# Patient Record
Sex: Female | Born: 1935 | Race: White | Hispanic: No | Smoking: Never smoker
Health system: Southern US, Community
[De-identification: ages and names within clinical notes are randomized; demographics above are authoritative.]

## PROBLEM LIST (undated history)

## (undated) DIAGNOSIS — I10 Essential (primary) hypertension: Secondary | ICD-10-CM

## (undated) DIAGNOSIS — D649 Anemia, unspecified: Secondary | ICD-10-CM

## (undated) DIAGNOSIS — K120 Recurrent oral aphthae: Secondary | ICD-10-CM

## (undated) DIAGNOSIS — F5102 Adjustment insomnia: Secondary | ICD-10-CM

## (undated) DIAGNOSIS — M545 Low back pain, unspecified: Secondary | ICD-10-CM

## (undated) DIAGNOSIS — F0280 Dementia in other diseases classified elsewhere without behavioral disturbance: Secondary | ICD-10-CM

## (undated) DIAGNOSIS — L0291 Cutaneous abscess, unspecified: Secondary | ICD-10-CM

## (undated) DIAGNOSIS — I4430 Unspecified atrioventricular block: Secondary | ICD-10-CM

## (undated) DIAGNOSIS — N181 Chronic kidney disease, stage 1: Secondary | ICD-10-CM

## (undated) DIAGNOSIS — N905 Atrophy of vulva: Secondary | ICD-10-CM

## (undated) DIAGNOSIS — M4800 Spinal stenosis, site unspecified: Secondary | ICD-10-CM

## (undated) DIAGNOSIS — J45909 Unspecified asthma, uncomplicated: Secondary | ICD-10-CM

## (undated) DIAGNOSIS — S43006A Unspecified dislocation of unspecified shoulder joint, initial encounter: Secondary | ICD-10-CM

## (undated) DIAGNOSIS — R32 Unspecified urinary incontinence: Secondary | ICD-10-CM

## (undated) DIAGNOSIS — M810 Age-related osteoporosis without current pathological fracture: Secondary | ICD-10-CM

## (undated) DIAGNOSIS — L723 Sebaceous cyst: Secondary | ICD-10-CM

## (undated) DIAGNOSIS — F329 Major depressive disorder, single episode, unspecified: Secondary | ICD-10-CM

## (undated) DIAGNOSIS — I951 Orthostatic hypotension: Secondary | ICD-10-CM

## (undated) DIAGNOSIS — M1990 Unspecified osteoarthritis, unspecified site: Secondary | ICD-10-CM

## (undated) DIAGNOSIS — R000 Tachycardia, unspecified: Secondary | ICD-10-CM

## (undated) DIAGNOSIS — R4182 Altered mental status, unspecified: Secondary | ICD-10-CM

## (undated) DIAGNOSIS — G4700 Insomnia, unspecified: Secondary | ICD-10-CM

## (undated) DIAGNOSIS — R51 Headache: Secondary | ICD-10-CM

## (undated) DIAGNOSIS — N289 Disorder of kidney and ureter, unspecified: Secondary | ICD-10-CM

## (undated) DIAGNOSIS — J449 Chronic obstructive pulmonary disease, unspecified: Secondary | ICD-10-CM

## (undated) DIAGNOSIS — F3289 Other specified depressive episodes: Secondary | ICD-10-CM

## (undated) DIAGNOSIS — R350 Frequency of micturition: Secondary | ICD-10-CM

## (undated) DIAGNOSIS — G2581 Restless legs syndrome: Secondary | ICD-10-CM

## (undated) DIAGNOSIS — E559 Vitamin D deficiency, unspecified: Secondary | ICD-10-CM

## (undated) DIAGNOSIS — R609 Edema, unspecified: Secondary | ICD-10-CM

## (undated) DIAGNOSIS — F411 Generalized anxiety disorder: Secondary | ICD-10-CM

## (undated) DIAGNOSIS — R627 Adult failure to thrive: Secondary | ICD-10-CM

## (undated) DIAGNOSIS — M109 Gout, unspecified: Secondary | ICD-10-CM

## (undated) DIAGNOSIS — L0390 Cellulitis, unspecified: Secondary | ICD-10-CM

## (undated) DIAGNOSIS — R269 Unspecified abnormalities of gait and mobility: Secondary | ICD-10-CM

## (undated) HISTORY — DX: Headache: R51

## (undated) HISTORY — DX: Orthostatic hypotension: I95.1

## (undated) HISTORY — DX: Chronic kidney disease, stage 1: N18.1

## (undated) HISTORY — DX: Unspecified asthma, uncomplicated: J45.909

## (undated) HISTORY — PX: BREAST SURGERY: SHX581

## (undated) HISTORY — DX: Low back pain, unspecified: M54.50

## (undated) HISTORY — DX: Sebaceous cyst: L72.3

## (undated) HISTORY — DX: Unspecified urinary incontinence: R32

## (undated) HISTORY — DX: Vitamin D deficiency, unspecified: E55.9

## (undated) HISTORY — DX: Adjustment insomnia: F51.02

## (undated) HISTORY — DX: Tachycardia, unspecified: R00.0

## (undated) HISTORY — DX: Major depressive disorder, single episode, unspecified: F32.9

## (undated) HISTORY — DX: Cutaneous abscess, unspecified: L02.91

## (undated) HISTORY — PX: HIP SURGERY: SHX245

## (undated) HISTORY — DX: Frequency of micturition: R35.0

## (undated) HISTORY — DX: Edema, unspecified: R60.9

## (undated) HISTORY — DX: Age-related osteoporosis without current pathological fracture: M81.0

## (undated) HISTORY — DX: Insomnia, unspecified: G47.00

## (undated) HISTORY — PX: BASAL CELL CARCINOMA EXCISION: SHX1214

## (undated) HISTORY — PX: EYE SURGERY: SHX253

## (undated) HISTORY — DX: Unspecified abnormalities of gait and mobility: R26.9

## (undated) HISTORY — DX: Unspecified dislocation of unspecified shoulder joint, initial encounter: S43.006A

## (undated) HISTORY — PX: BACK SURGERY: SHX140

## (undated) HISTORY — DX: Dementia in other diseases classified elsewhere, unspecified severity, without behavioral disturbance, psychotic disturbance, mood disturbance, and anxiety: F02.80

## (undated) HISTORY — DX: Disorder of kidney and ureter, unspecified: N28.9

## (undated) HISTORY — DX: Altered mental status, unspecified: R41.82

## (undated) HISTORY — DX: Other specified depressive episodes: F32.89

## (undated) HISTORY — DX: Restless legs syndrome: G25.81

## (undated) HISTORY — DX: Recurrent oral aphthae: K12.0

## (undated) HISTORY — PX: ABDOMINAL HYSTERECTOMY: SHX81

## (undated) HISTORY — DX: Low back pain: M54.5

## (undated) HISTORY — DX: Gout, unspecified: M10.9

## (undated) HISTORY — DX: Adult failure to thrive: R62.7

## (undated) HISTORY — DX: Generalized anxiety disorder: F41.1

## (undated) HISTORY — DX: Unspecified osteoarthritis, unspecified site: M19.90

## (undated) HISTORY — DX: Spinal stenosis, site unspecified: M48.00

## (undated) HISTORY — DX: Unspecified atrioventricular block: I44.30

## (undated) HISTORY — DX: Atrophy of vulva: N90.5

## (undated) HISTORY — DX: Cellulitis, unspecified: L03.90

## (undated) HISTORY — DX: Anemia, unspecified: D64.9

---

## 2003-07-27 ENCOUNTER — Emergency Department (HOSPITAL_COMMUNITY): Admission: AD | Admit: 2003-07-27 | Discharge: 2003-07-27 | Payer: Self-pay | Admitting: Family Medicine

## 2004-07-22 ENCOUNTER — Ambulatory Visit: Payer: Self-pay | Admitting: Dentistry

## 2004-07-22 ENCOUNTER — Encounter: Admission: AD | Admit: 2004-07-22 | Discharge: 2004-07-22 | Payer: Self-pay | Admitting: Dentistry

## 2004-07-25 ENCOUNTER — Inpatient Hospital Stay (HOSPITAL_COMMUNITY): Admission: EM | Admit: 2004-07-25 | Discharge: 2004-07-29 | Payer: Self-pay | Admitting: Emergency Medicine

## 2004-08-05 ENCOUNTER — Ambulatory Visit: Payer: Self-pay | Admitting: Dentistry

## 2004-12-14 ENCOUNTER — Emergency Department (HOSPITAL_COMMUNITY): Admission: EM | Admit: 2004-12-14 | Discharge: 2004-12-14 | Payer: Self-pay | Admitting: Family Medicine

## 2004-12-19 ENCOUNTER — Ambulatory Visit: Payer: Self-pay | Admitting: Dentistry

## 2005-02-12 ENCOUNTER — Inpatient Hospital Stay (HOSPITAL_COMMUNITY): Admission: EM | Admit: 2005-02-12 | Discharge: 2005-02-17 | Payer: Self-pay | Admitting: Emergency Medicine

## 2006-02-16 ENCOUNTER — Encounter: Admission: RE | Admit: 2006-02-16 | Discharge: 2006-02-16 | Payer: Self-pay | Admitting: Neurosurgery

## 2006-03-10 ENCOUNTER — Encounter: Admission: RE | Admit: 2006-03-10 | Discharge: 2006-03-10 | Payer: Self-pay | Admitting: Neurosurgery

## 2006-08-27 ENCOUNTER — Encounter: Admission: RE | Admit: 2006-08-27 | Discharge: 2006-08-27 | Payer: Self-pay | Admitting: Neurosurgery

## 2006-10-29 ENCOUNTER — Inpatient Hospital Stay (HOSPITAL_COMMUNITY): Admission: RE | Admit: 2006-10-29 | Discharge: 2006-11-09 | Payer: Self-pay | Admitting: Neurosurgery

## 2006-10-29 ENCOUNTER — Ambulatory Visit: Payer: Self-pay | Admitting: Pulmonary Disease

## 2006-11-01 ENCOUNTER — Ambulatory Visit: Payer: Self-pay | Admitting: Physical Medicine & Rehabilitation

## 2006-12-14 ENCOUNTER — Ambulatory Visit: Payer: Self-pay | Admitting: Vascular Surgery

## 2006-12-14 ENCOUNTER — Encounter: Payer: Self-pay | Admitting: Vascular Surgery

## 2006-12-14 ENCOUNTER — Ambulatory Visit: Admission: RE | Admit: 2006-12-14 | Discharge: 2006-12-14 | Payer: Self-pay | Admitting: *Deleted

## 2007-02-04 ENCOUNTER — Ambulatory Visit: Payer: Self-pay | Admitting: Vascular Surgery

## 2007-02-25 ENCOUNTER — Ambulatory Visit: Payer: Self-pay | Admitting: Vascular Surgery

## 2007-09-23 ENCOUNTER — Emergency Department (HOSPITAL_COMMUNITY): Admission: EM | Admit: 2007-09-23 | Discharge: 2007-09-23 | Payer: Self-pay | Admitting: Emergency Medicine

## 2008-06-21 ENCOUNTER — Encounter: Admission: RE | Admit: 2008-06-21 | Discharge: 2008-06-21 | Payer: Self-pay | Admitting: Neurosurgery

## 2009-04-29 ENCOUNTER — Encounter: Payer: Self-pay | Admitting: Internal Medicine

## 2009-04-30 ENCOUNTER — Inpatient Hospital Stay (HOSPITAL_COMMUNITY): Admission: RE | Admit: 2009-04-30 | Discharge: 2009-05-03 | Payer: Self-pay | Admitting: Orthopaedic Surgery

## 2009-06-05 ENCOUNTER — Encounter: Admission: RE | Admit: 2009-06-05 | Discharge: 2009-06-05 | Payer: Self-pay | Admitting: Internal Medicine

## 2009-07-06 ENCOUNTER — Inpatient Hospital Stay (HOSPITAL_COMMUNITY): Admission: EM | Admit: 2009-07-06 | Discharge: 2009-07-07 | Payer: Self-pay | Admitting: Emergency Medicine

## 2009-08-02 ENCOUNTER — Inpatient Hospital Stay (HOSPITAL_COMMUNITY): Admission: EM | Admit: 2009-08-02 | Discharge: 2009-08-03 | Payer: Self-pay | Admitting: Emergency Medicine

## 2009-08-24 ENCOUNTER — Inpatient Hospital Stay (HOSPITAL_COMMUNITY): Admission: EM | Admit: 2009-08-24 | Discharge: 2009-08-24 | Payer: Self-pay | Admitting: Emergency Medicine

## 2009-12-14 ENCOUNTER — Ambulatory Visit (HOSPITAL_COMMUNITY): Admission: RE | Admit: 2009-12-14 | Discharge: 2009-12-14 | Payer: Self-pay | Admitting: Unknown Physician Specialty

## 2009-12-21 ENCOUNTER — Observation Stay (HOSPITAL_COMMUNITY): Admission: EM | Admit: 2009-12-21 | Discharge: 2009-12-26 | Payer: Self-pay | Admitting: Emergency Medicine

## 2010-02-02 ENCOUNTER — Emergency Department (HOSPITAL_COMMUNITY)
Admission: EM | Admit: 2010-02-02 | Discharge: 2010-02-02 | Payer: Self-pay | Source: Home / Self Care | Admitting: Emergency Medicine

## 2010-02-02 ENCOUNTER — Encounter: Payer: Self-pay | Admitting: Internal Medicine

## 2010-02-06 ENCOUNTER — Ambulatory Visit: Payer: Self-pay | Admitting: Internal Medicine

## 2010-02-06 DIAGNOSIS — I10 Essential (primary) hypertension: Secondary | ICD-10-CM

## 2010-02-06 DIAGNOSIS — R9431 Abnormal electrocardiogram [ECG] [EKG]: Secondary | ICD-10-CM

## 2010-02-06 DIAGNOSIS — J45909 Unspecified asthma, uncomplicated: Secondary | ICD-10-CM | POA: Insufficient documentation

## 2010-08-06 ENCOUNTER — Encounter: Admission: RE | Admit: 2010-08-06 | Discharge: 2010-08-06 | Payer: Self-pay | Admitting: Internal Medicine

## 2010-08-08 IMAGING — RF DG HIP OPERATIVE*R*
1 series · 1 of 1 positions shown · non-contrast
Comparison: Right hip x-rays earlier today.

CLINICAL DATA: Closed reduction right hip prosthesis dislocation.

OPERATIVE RIGHT HIP 1 VIEW [DATE]/[id number] hours:

[Series 1: run · 1 of 1 slices shown]
[im 1/1]
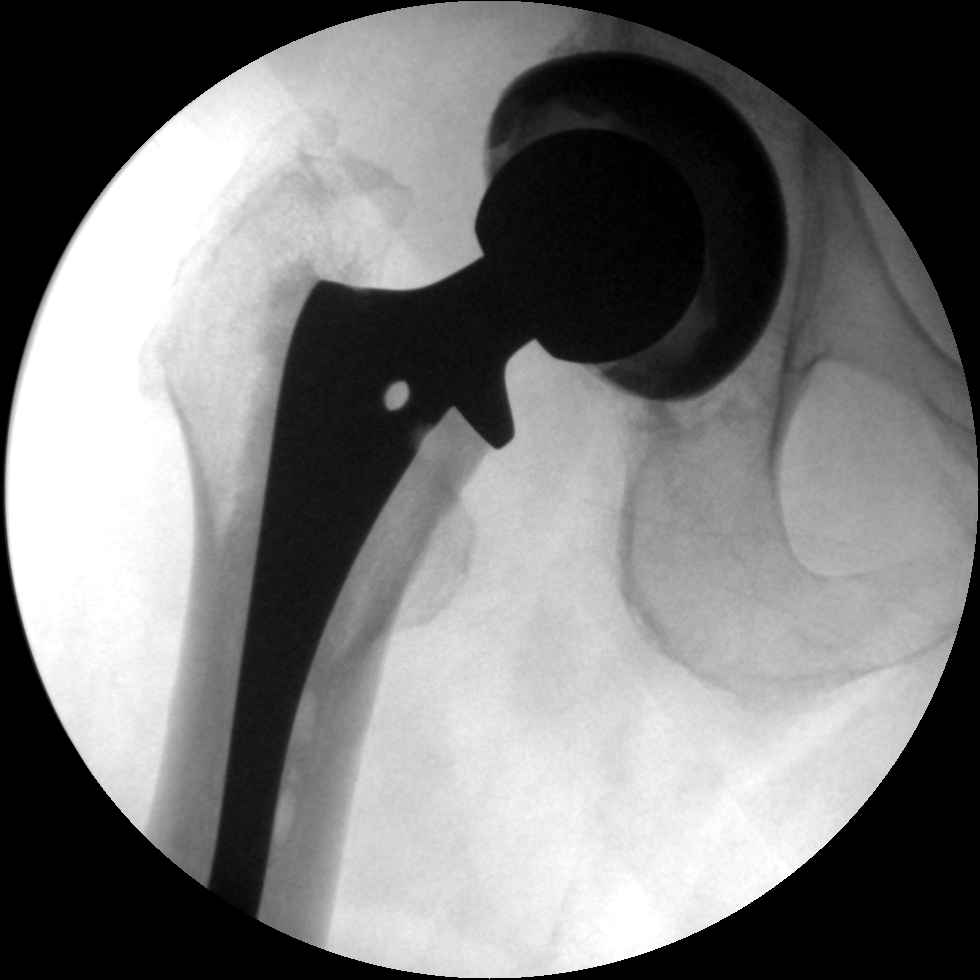

[1 of 1 positions shown; findings below may reference images not displayed]

FINDINGS: Anatomic alignment in the AP projection, status post
reduction of the right hip prosthesis dislocation.
IMPRESSION: Anatomic alignment status post right hip prosthesis reduction.

## 2010-08-08 IMAGING — CR DG HIP COMPLETE 2+V*R*
3 series · 3 of 3 positions shown · non-contrast
Comparison: 07/06/2009

CLINICAL DATA: Hip pain

RIGHT HIP - COMPLETE 2+ VIEW

[t pelvis a.p.]
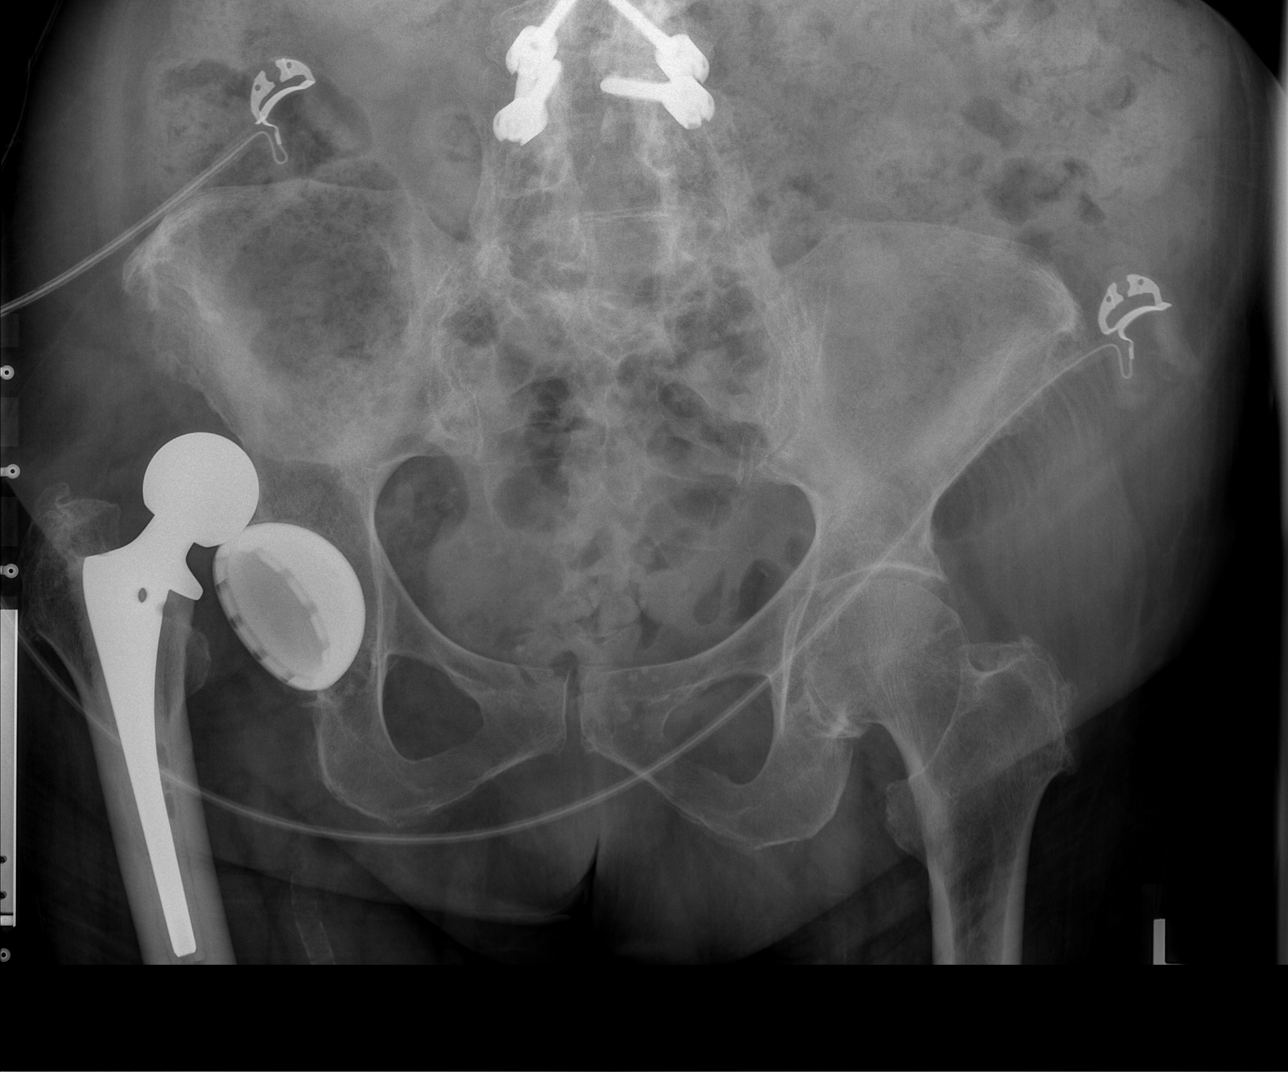

[t hip ap right]
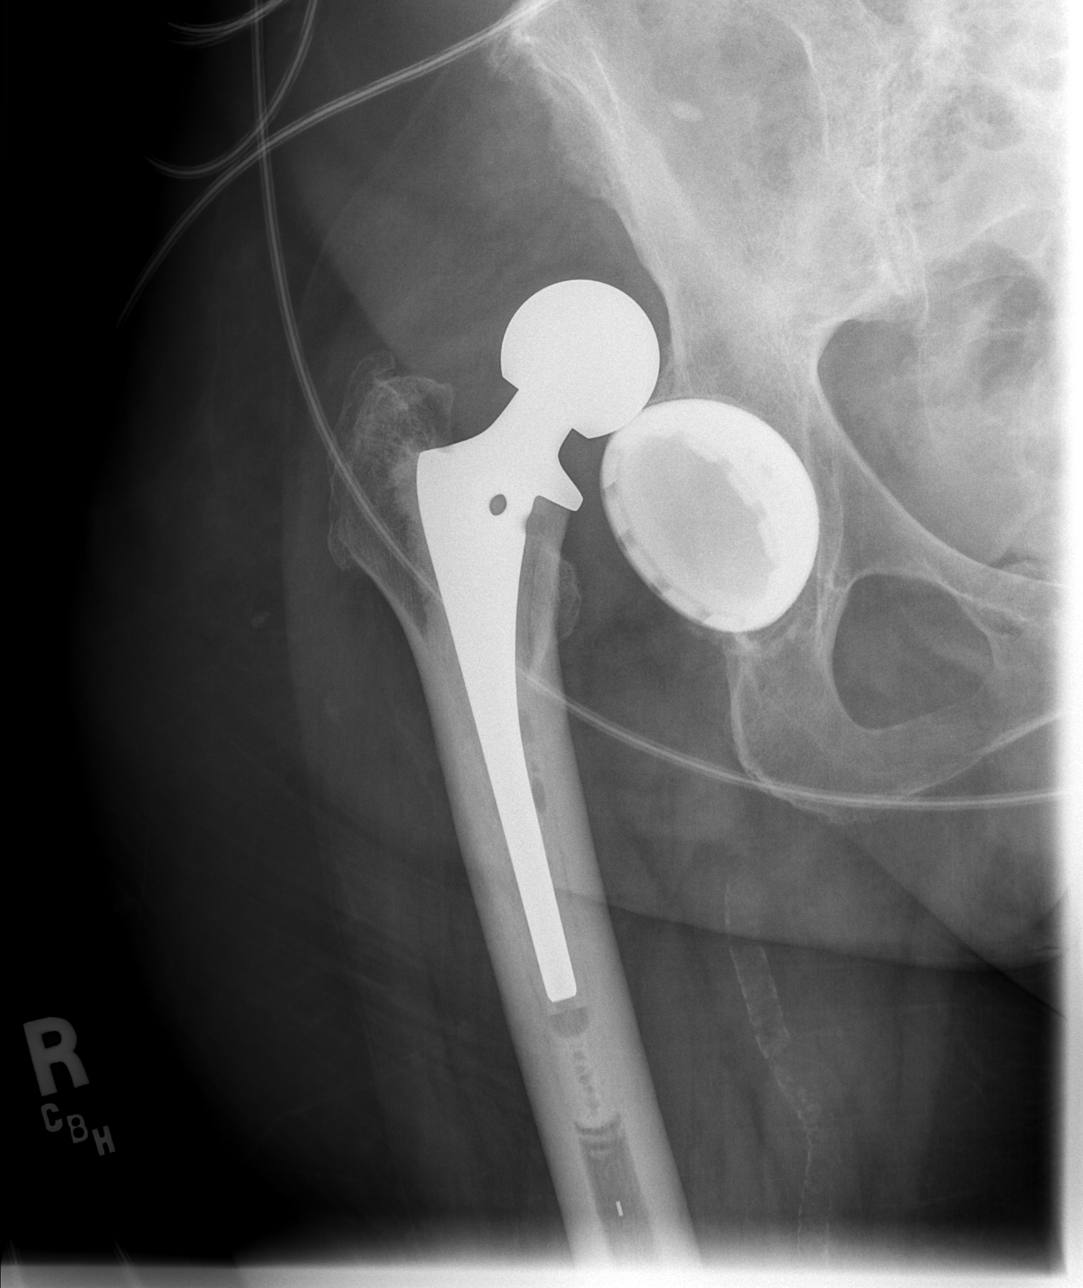

[w cross table hip right]
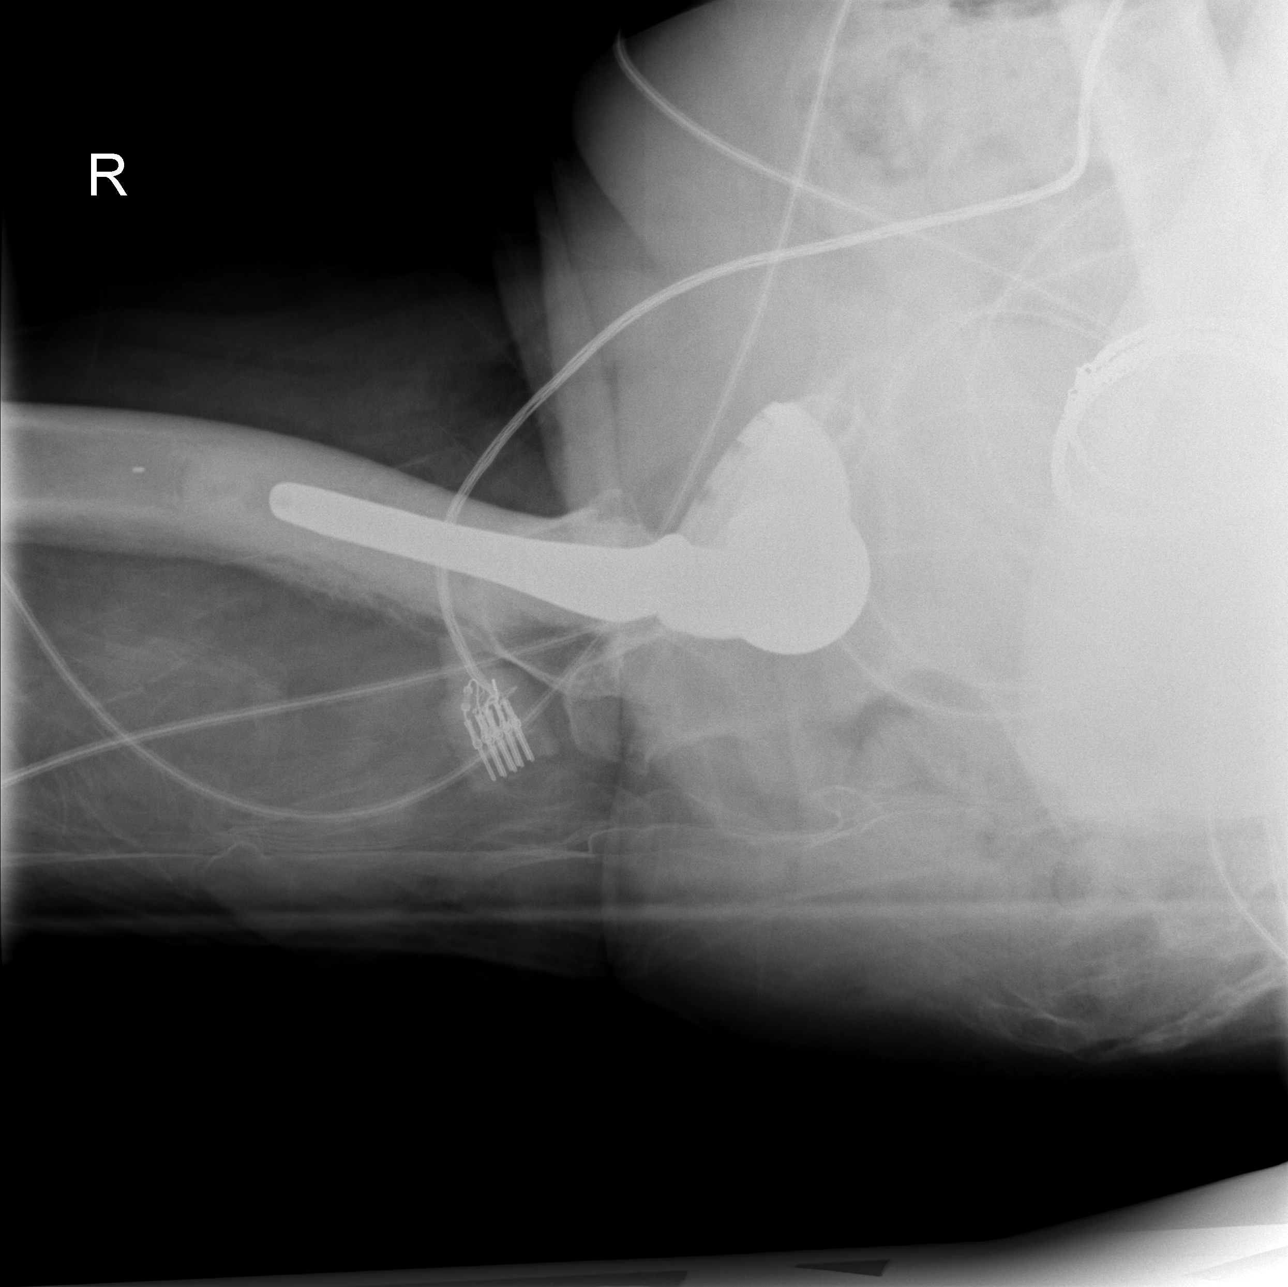

[3 of 3 positions shown; findings below may reference images not displayed]

FINDINGS: The patient is status post right hip arthroplasty.

There has been superior dislocation of the right hip with respect
to the acetabulum.

No fractures are identified.

There are transpedicular screws and rods identified within the
lumbar spine.
IMPRESSION: 1.  Status post superior dislocation of the right hip.

## 2010-10-19 ENCOUNTER — Encounter: Payer: Self-pay | Admitting: Neurosurgery

## 2010-10-28 NOTE — Assessment & Plan Note (Signed)
Summary: eph/jml   History of Present Illness: patient is a 77 year old who is referred for evaluation of conduction delay. The patient has a history of HTN.  She has been on several medinces including verapamil.  In August of last year, EKG showed first degree AV block. ON Sunday she was seen in the [facility] ER because of shortness of breath.  Sat was reported 90% but in the ER it was 95%.   The patient's bp intially was 129/43; Pulse 73.  Work up ssusp for possible URI possible pneumonia.  She was sent back to [place] on Abx.  Of note EDG showed Type 1 second degree AVB which was new. I spoke with the patient''s daughter earlier this week.  She is concerned that patients recent falls may be related to possible bradycardia leading to dizziness.  After review I recommended to S. [place] that she stop Verapamil 240 and come in for evaluation. The patient denies shorthess of breath.  No chest pains.  She is getting stronger with rehab.  No dizziness. Patient sleeps well at night.  No pillow.  Current Medications (verified): 1)  Lisinopril 40 Mg Tabs (Lisinopril) .Marland Kitchen.. 1 Tab Every Day 2)  Lasix 20 Mg Tabs (Furosemide) .Marland Kitchen.. 1 Tab As Needed Edema in Legs/feet 3)  Robaxin 500 Mg Tabs (Methocarbamol) .Marland Kitchen.. 1 Tab 3 Times Per Day Prn 4)  Colcrys 0.6 Mg Tabs (Colchicine) .Marland Kitchen.. 1 Tab Every Day As Needed For Gout 5)  Clonazepam 1 Mg Tabs (Clonazepam) .Marland Kitchen.. 1 Tab Two Times A Day As Needed Anxiety 6)  Aricept 10 Mg Tabs (Donepezil Hcl) .Marland Kitchen.. 1 Tab Every Day 7)  Remeron Soltab 15 Mg Tbdp (Mirtazapine) .Marland Kitchen.. 1 Tab At Bedtime 8)  Singulair 10 Mg Tabs (Montelukast Sodium) .Marland Kitchen.. 1 Tab At Bedtime 9)  Allopurinol 300 Mg Tabs (Allopurinol) .Marland Kitchen.. 1 Tab At Bedtime 10)  Avelox 400 Mg Tabs (Moxifloxacin Hcl) .Marland Kitchen.. 1 Tab Every Day Times 7 Days 11)  Advair Diskus 250-50 Mcg/dose Aepb (Fluticasone-Salmeterol) .Marland Kitchen.. 1 Puff 2 Times Per Day 12)  Klor-Con M10 10 Meq Cr-Tabs (Potassium Chloride Crys Cr) .Marland Kitchen.. 1 Tab ( Take With  Lasix) 13)  Hydrochlorothiazide 25 Mg Tabs (Hydrochlorothiazide) .Marland Kitchen.. 1 Tab Every Day in The Am  Past History:  Past Medical History: Current Problems:  ALZHEIMER'S DISEASE (ICD-331.0) ASTHMA, UNSPECIFIED, UNSPECIFIED STATUS (ICD-493.90) HYPERTENSION (ICD-401.9) Hx multiple joint dislocations Anxiety Arthritis Gout  Past Surgical History: . Status post right hip replacement with recurrent dislocation.  Back surgery Breast reduction carpal tunnel release Cataract surgery TAH Face lift  Family History: Mother CVA Father: CVA Sister with CHF, CVA Brother died in 3s of MI.  Social History: no tobaccoc occasional EtOH.  Review of Systems       All systems reviewed.  Negative to the above problem except as noted above.  Vital Signs:  Patient profile:   77 year old female Pulse rate:   76 / minute BP sitting:   134 / 72  (left arm) BP standing:   160 / 86  (left arm)  Physical Exam  General:  Petite 77 year old in NAD  Head:  normocephalic and atraumatic Neck:  No bruits.  NO thyromegaly.  No significant JVD Lungs:  Relatively clear.  No rales or wheezes. Heart:  RRR.  S1, S2.  No S3.  No murmurs. Abdomen:  Supple.  Obese.  No hepatomegaly  Nontender.  No masses. Extremities:  No lower extrem edema.  2+ pules. Neurologic:  A and  O x. 3.   EKG  Procedure date:  02/06/2010  Findings:      NSR . 66 bpm.  First degree AV block  PR 260 msec.  LVH.  Por R  wave progression in lateral leads.  Possible inferior MI Compared to previous ECG, Type I second degree AV block is resolved.  QRS voltage is less in anterior precordium  Impression & Recommendations:  Problem # 1:  ABNORMAL EKG (ICD-794.31) EKG today shows improvemnt in the supraventricular conduction.   I am not sure the patient's falling is related to conduction problems.  But, I thiink it is reasonable to avoid AV nodal blocking drugs. I would not schedule further testing for now. Volume status looks  good.  she denies shrtness of breath.  No PND EKG today shows much less prominent R wavesd in anteror precordium than on previous ECG from Sunday.  I do not think she has had an acute event.  May be becuase EKG was done in wheelchair.  Patient is comfortable.  I would follow.  Problem # 2:  HYPERTENSION (ICD-401.9) BP is fair today.  Do need to clarify meds.   IF her bp goes up would consider Norvasc 2.5 mg. Will need f/u labs.  Last K was 4.8  BUN/Cr was 51/1.1  Other Orders: EKG w/ Interpretation (93000)

## 2010-12-16 LAB — POCT I-STAT, CHEM 8
BUN: 51 mg/dL — ABNORMAL HIGH (ref 6–23)
Creatinine, Ser: 1.1 mg/dL (ref 0.4–1.2)
Potassium: 4.8 mEq/L (ref 3.5–5.1)
Sodium: 140 mEq/L (ref 135–145)

## 2010-12-16 LAB — URINALYSIS, ROUTINE W REFLEX MICROSCOPIC
Glucose, UA: NEGATIVE mg/dL
Hgb urine dipstick: NEGATIVE
Specific Gravity, Urine: 1.013 (ref 1.005–1.030)
pH: 7 (ref 5.0–8.0)

## 2010-12-16 LAB — CBC
MCV: 91.2 fL (ref 78.0–100.0)
RBC: 4 MIL/uL (ref 3.87–5.11)
WBC: 12 10*3/uL — ABNORMAL HIGH (ref 4.0–10.5)

## 2010-12-16 LAB — POCT CARDIAC MARKERS
CKMB, poc: <1 ng/mL — ABNORMAL LOW (ref 1.0–8.0)
Myoglobin, poc: 115 ng/mL (ref 12–200)

## 2010-12-16 LAB — DIFFERENTIAL
Lymphocytes Relative: 16 % (ref 12–46)
Lymphs Abs: 1.9 10*3/uL (ref 0.7–4.0)
Monocytes Relative: 5 % (ref 3–12)
Neutro Abs: 9.1 10*3/uL — ABNORMAL HIGH (ref 1.7–7.7)
Neutrophils Relative %: 76 % (ref 43–77)

## 2010-12-31 LAB — POCT I-STAT, CHEM 8
BUN: 38 mg/dL — ABNORMAL HIGH (ref 6–23)
Calcium, Ion: 1.07 mmol/L — ABNORMAL LOW (ref 1.12–1.32)
Chloride: 105 mEq/L (ref 96–112)
HCT: 38 % (ref 36.0–46.0)
Potassium: 3.9 mEq/L (ref 3.5–5.1)

## 2010-12-31 LAB — CBC
HCT: 35.7 % — ABNORMAL LOW (ref 36.0–46.0)
Hemoglobin: 12.1 g/dL (ref 12.0–15.0)
MCHC: 33.9 g/dL (ref 30.0–36.0)
MCV: 90.8 fL (ref 78.0–100.0)
Platelets: 305 K/uL (ref 150–400)
RBC: 3.93 MIL/uL (ref 3.87–5.11)
RDW: 16 % — ABNORMAL HIGH (ref 11.5–15.5)
WBC: 11.6 K/uL — ABNORMAL HIGH (ref 4.0–10.5)

## 2010-12-31 LAB — BASIC METABOLIC PANEL WITH GFR
BUN: 39 mg/dL — ABNORMAL HIGH (ref 6–23)
CO2: 27 meq/L (ref 19–32)
Chloride: 100 meq/L (ref 96–112)
Potassium: 3.7 meq/L (ref 3.5–5.1)

## 2010-12-31 LAB — BASIC METABOLIC PANEL
Calcium: 9.1 mg/dL (ref 8.4–10.5)
Creatinine, Ser: 1.29 mg/dL — ABNORMAL HIGH (ref 0.4–1.2)
GFR calc Af Amer: 47 mL/min — ABNORMAL LOW (ref >60)
GFR calc non Af Amer: 39 mL/min — ABNORMAL LOW (ref >60)
Glucose, Bld: 107 mg/dL — ABNORMAL HIGH (ref 70–99)
Sodium: 136 mEq/L (ref 135–145)

## 2010-12-31 LAB — DIFFERENTIAL
Basophils Absolute: 0.1 10*3/uL (ref 0.0–0.1)
Basophils Relative: 1 % (ref 0–1)
Eosinophils Absolute: 0.3 K/uL (ref 0.0–0.7)
Eosinophils Relative: 3 % (ref 0–5)
Lymphocytes Relative: 23 % (ref 12–46)
Lymphs Abs: 2.6 K/uL (ref 0.7–4.0)
Monocytes Absolute: 0.5 10*3/uL (ref 0.1–1.0)
Monocytes Relative: 5 % (ref 3–12)
Neutro Abs: 8 10*3/uL — ABNORMAL HIGH (ref 1.7–7.7)
Neutrophils Relative %: 70 % (ref 43–77)

## 2011-01-03 LAB — BASIC METABOLIC PANEL
BUN: 15 mg/dL (ref 6–23)
BUN: 21 mg/dL (ref 6–23)
CO2: 27 mEq/L (ref 19–32)
CO2: 32 mEq/L (ref 19–32)
Chloride: 102 mEq/L (ref 96–112)
Chloride: 102 mEq/L (ref 96–112)
Chloride: 102 mEq/L (ref 96–112)
Chloride: 103 mEq/L (ref 96–112)
Creatinine, Ser: 0.91 mg/dL (ref 0.4–1.2)
Creatinine, Ser: 0.94 mg/dL (ref 0.4–1.2)
GFR calc Af Amer: >60 mL/min (ref >60)
GFR calc Af Amer: >60 mL/min (ref >60)
GFR calc non Af Amer: >60 mL/min (ref >60)
Glucose, Bld: 106 mg/dL — ABNORMAL HIGH (ref 70–99)
Glucose, Bld: 143 mg/dL — ABNORMAL HIGH (ref 70–99)
Glucose, Bld: 88 mg/dL (ref 70–99)
Potassium: 4.3 mEq/L (ref 3.5–5.1)
Potassium: 4.5 mEq/L (ref 3.5–5.1)
Potassium: 5 mEq/L (ref 3.5–5.1)
Sodium: 136 mEq/L (ref 135–145)
Sodium: 138 mEq/L (ref 135–145)
Sodium: 142 mEq/L (ref 135–145)

## 2011-01-03 LAB — CBC
HCT: 30.1 % — ABNORMAL LOW (ref 36.0–46.0)
HCT: 30.1 % — ABNORMAL LOW (ref 36.0–46.0)
HCT: 40.8 % (ref 36.0–46.0)
Hemoglobin: 10.1 g/dL — ABNORMAL LOW (ref 12.0–15.0)
Hemoglobin: 10.7 g/dL — ABNORMAL LOW (ref 12.0–15.0)
Hemoglobin: 13.7 g/dL (ref 12.0–15.0)
MCHC: 33.3 g/dL (ref 30.0–36.0)
MCHC: 33.5 g/dL (ref 30.0–36.0)
MCV: 95.3 fL (ref 78.0–100.0)
MCV: 95.4 fL (ref 78.0–100.0)
MCV: 95.7 fL (ref 78.0–100.0)
MCV: 96.7 fL (ref 78.0–100.0)
RBC: 3.34 MIL/uL — ABNORMAL LOW (ref 3.87–5.11)
RBC: 4.26 MIL/uL (ref 3.87–5.11)
RDW: 13.9 % (ref 11.5–15.5)
RDW: 13.9 % (ref 11.5–15.5)
WBC: 9.4 10*3/uL (ref 4.0–10.5)

## 2011-01-03 LAB — PROTIME-INR
INR: 1.4 (ref 0.00–1.49)
Prothrombin Time: 14.1 seconds (ref 11.6–15.2)
Prothrombin Time: 17.2 seconds — ABNORMAL HIGH (ref 11.6–15.2)

## 2011-02-10 NOTE — Op Note (Signed)
[name], [name]               ACCOUNT NO.:  [id number]   MEDICAL RECORD NO.:  [id number]          PATIENT TYPE:  INP   LOCATION:  5007                         FACILITY:  [facility]   PHYSICIAN:  Lubertha Basque. Dalldorf, M.D.DATE OF BIRTH:  [birth year]   DATE OF PROCEDURE:  04/30/2009  DATE OF DISCHARGE:                               OPERATIVE REPORT   PREOPERATIVE DIAGNOSIS:  Right hip degenerative arthritis.   POSTOPERATIVE DIAGNOSIS:  Right hip degenerative arthritis.   PROCEDURE:  Right total hip replacement.   ANESTHESIA:  General.   ATTENDING SURGEON:  Lubertha Basque. Jerl Santos, MD   ASSISTANT:  Lindwood Qua, PA   INDICATIONS FOR PROCEDURE:  The patient is a 77 year old very healthy  woman who has disabling right hip pain due to degenerative arthritis.  She also has some back issues and has had these addressed.  It is the  opinion of her neurosurgeon that the pain is not from her back.  She has  had 3 intraarticular injections in the hip joint, the first two of which  did afford her transient relief.  She has failed other measures as well.  She has pain which limits her ability to rest and walk and get out of  bed and at this point is offered a hip replacement.  Informed operative  consent was obtained after discussion of possible complications  including reaction to anesthesia, infection, DVT, dislocation, and  death.   SUMMARY, FINDINGS, AND PROCEDURE:  Under general anesthesia through a  standard posterior approach, a right total hip replacement was  performed.  She did have advanced degenerative change but surprisingly  good bone quality.  We addressed her problem with a cemented DePuy  system using a size 50 pinnacle cup with a 10-degree +4 liner.  We did  use a hole eliminator.  On the femoral side, we used a cemented size 2  standard Summit stem with a Cementralizer.  We capped this with a 32 +1  hip ball.  Lindwood Qua assisted throughout and was invaluable to  the  completion of the case in that he helped position and retract while  I performed the procedure.  He also closed simultaneously to help  minimize OR time.   DESCRIPTION OF PROCEDURE:  The patient was taken to the operating suite  where a general anesthetic was applied without difficulty.  She was  positioned in the lateral decubitus position with the right hip up.  All  bony prominences were appropriately padded and axillary roll was placed.  Hip positioners were utilized.  She was prepped and draped in a normal  sterile fashion.  After administration of IV Kefzol, a posterior  approach was taken to the right hip.  An incision was made with  dissection down through abundant adipose tissue.  All appropriate  antiinfective measures were used including the preoperative IV  antibiotic, Betadine-impregnated drape, and closed hooded exhaust  systems for each member of surgical team.  An incision was made in the  IT band and gluteus maximus fascia.  A Charnley retractor was placed.  The short external rotators were tagged and  reflected.  A posterior  capsulectomy was performed and the hip was dislocated.  A femoral neck  cut was made removing the femoral head.  The acetabulum was exposed and  labral tissues were excised.  I took reaming towards the inside wall of  the pelvis and sequentially went up to a 49.  This was followed by  placement of size 50 pinnacle shell in an appropriate anteversion and  tilt.  Placed a trial liner.  We then turned our attention towards the  femur.  This was reamed and broached up to a size 2 which seemed to fit  best.  We performed a trial reduction and she was stable with a +2  standard head neck assembly.  She was stable in extension with external  rotation and flexion with internal rotation.  Leg lengths were felt to  be about equal with this component.  The trial components were removed  followed by placement of the 32 +4 10-degree liner in the acetabular   shell.  At the femur, we placed a distal cement spacer, followed by  thorough irrigation of the canal.  Cement was mixed and was pressurized  into the canal followed by placement of the size 2 standard Summit stem  in the appropriate slight anteversion.  Excess cement was trimmed and  pressure was held on the components until the cement had hardened.  We  then placed the 32 +1 hip ball on a dry stem.  Hip was again reduced and  was stable in the aforementioned positions and leg lengths were felt to  be about equal.  The wound was irrigated followed by reapproximation of  the short external rotators to the greater trochanteric region with  nonabsorbable suture.  IT band and gluteus maximus fascia were  reapproximated with #1 Vicryl followed by subcutaneous reapproximation  in several layers and skin closure with staples.  A dressing was  applied.  Estimated blood loss and intraoperative fluids can be obtained  from anesthesia records.   DISPOSITION:  The patient was extubated in the operating room and taken  to the recovery room in stable addition.  She was to be admitted to the  Orthopedic Surgery Service for appropriate postop care to include  perioperative antibiotics and Coumadin plus Lovenox for DVT prophylaxis.      Lubertha Basque Jerl Santos, M.D.  Electronically Signed     PGD/MEDQ  D:  04/30/2009  T:  05/01/2009  Job:  [id number]

## 2011-02-10 NOTE — Discharge Summary (Signed)
NAMEMALICIA, [name]               ACCOUNT NO.:  [id number]   MEDICAL RECORD NO.:  [id number]          PATIENT TYPE:  INP   LOCATION:  5007                         FACILITY:  [facility]   PHYSICIAN:  Lubertha Basque. Dalldorf, M.D.DATE OF BIRTH:  [birth year]   DATE OF ADMISSION:  04/30/2009  DATE OF DISCHARGE:  05/03/2009                               DISCHARGE SUMMARY   ADMITTING DIAGNOSES:  1. Right hip end-stage degenerative joint disease.  2. History of asthma.  3. History of chronic obstructive pulmonary disease.  4. History of hypertension.  5. History of anxiety.  6. History of gout.  7. History of dementia.   DISCHARGE DIAGNOSES:  1. Right hip end-stage degenerative joint disease.  2. History of asthma.  3. History of chronic obstructive pulmonary disease.  4. History of hypertension.  5. History of anxiety.  6. History of gout.  7. History of dementia.   OPERATION:  Right total hip replacement.   BRIEF HISTORY:  [name] is a 77 year old white female patient well-  known to our practice.  She has had increasing right hip pain for quite  some time and we had seen her in the office a week or two ago and tried  an interarticular fluoroscopy guided hip injection on the right side  which gave her minimal relief.  Her x-rays reveal end-stage DJD.  We  discussed treatment options and the amount of pain she was having,  difficulty ambulating, and trouble sleeping at night time.  Both her and  her daughter agreed to proceed with a total hip replacement.   PERTINENT LABORATORY DATA AND X-RAY FINDINGS:  Chest x-ray:  No active  chest disease noted.  Hemoglobin 10.7, hematocrit 32.3, WBCs 11.4, and  platelets 214.  Sodium 136, potassium 4.0, BUN 18, creatinine 0.91, and  glucose 88.  Last INR was 1.3.   COURSE IN THE HOSPITAL:  The patient was admitted postoperatively and  was on IV Ancef 1 gram q.6 x3 doses, IV pain medicine which included  Dilaudid and then oral pain medicines as  well as antiemetics,  antispasmodics.  Pharmacy was contacted for low-dose Coumadin protocol  and Lovenox protocol for DVT prophylaxis.  She was kept in knee-high  TEDs and then initiated weightbearing as tolerated with the help of  physical therapy.  She had O2 two liters per nasal cannula with a pulse  oximeter.  Foley catheter for the first 1-2 days, later discontinued and  was able to void well and around and kept on her home medicines which  will be outlined at the end of this dictation.  On the first day postop,  her vital signs were stable.  She was comfortable on IV Dilaudid.  Foley  catheter was removed.  She was able to void on her own.  Dressing was  minimally covered with drainage, no sign of infection.  Normal  neurovascular status to her lower extremities.  Leg lengths appeared to  be equal.  Lungs were clear.  Abdomen was soft with positive bowel  sounds.  On second day postop, her dressing was changed.  Foley catheter  was discontinued and O2 was also discontinued.  Her lungs were clear.  Abdomen was soft, voiding well, and dressing change was noted to be  benign without sign of infection or adverse feature.  Good neurovascular  status distally without calf swelling or pain.  It was felt that she was  not able to be discharged home and would be necessary to go to a rehab  unit and they have found a tentative bed at [facility] and this is  where she will be discharged to.   CONDITION ON DISCHARGE:  Improved.   DISCHARGE INSTRUCTIONS:  She will be on a low-sodium heart-healthy diet.  Change her dressing every other day.  For any sign of redness or sign of  infection to call our office at [phone] and reach here immediately.  She could be weightbearing as tolerated.  She can use a knee immobilizer  at nighttime for dislocation precautions.  No knee immobilizer needed  during the day while she is awake or ambulating.  She may once again be  weightbearing as tolerated with  assistance from therapy, crutches, or  walker which ever is more stable.  Return to Dr. Nolon Nations office in 2  weeks or longer for staple removal.  She will be discharged from the  hospital on Coumadin for a total of 4 weeks and then possibly continue  on Lovenox until her INR is therapeutic at 2.0 or greater.  The range  should be 2.0-3.0.   DISCHARGE MEDICATIONS:  1. Vicodin as a pain pill 1-2 q.4-6 p.r.n. pain.  2. Lisinopril 40 mg one a day.  3. Hydrochlorothiazide 25 mg one a day.  4. Cyclobenzaprine 10 mg p.o. t.i.d. p.r.n. spasm.  5. Colchicine 0.6 mg daily.  6. Clonazepam 1 mg b.i.d.  7. Advair 250/500 b.i.d.  8. Albuterol q.4 h.  9. Aricept 10 mg once a day.  10.Remeron 50 mg at night time.  11.Verapamil 240 once at night time.  12.Singulair 10 mg at night.  13.Tylenol 325 p.r.n. 1-2 temperature elevation or pain.   Once again for any sign of infection, we want to see her in the office  immediately or have the nurses call us and then return to the office in  2 weeks plus for postop exam and staple removal.      Lindwood Qua, P.A.      Lubertha Basque Jerl Santos, M.D.  Electronically Signed    MC/MEDQ  D:  05/02/2009  T:  05/03/2009  Job:  [id number]

## 2011-02-10 NOTE — Consult Note (Signed)
NAMETARISHA, FADER NO.:  [id number]   MEDICAL RECORD NO.:  [id number]          PATIENT TYPE:  EMS   LOCATION:  MINO                         FACILITY:  [facility]   PHYSICIAN:  Dionne Ano. Gramig III, M.D.DATE OF BIRTH:  12/24/1915   DATE OF CONSULTATION:  DATE OF DISCHARGE:  09/23/2007                                 CONSULTATION   [name] presents to the [facility] Emergency Room after falling.  She had a same level fall with abrasion to the left elbow and a right  distal radius fracture with obvious deformity.  She is alert and  oriented, and denies loss of consciousness.  She is 77 years old, and in  fairly decent health.  Past medical history is reviewed which includes  asthma, COPD, hypertension, degenerative joint disease, anxiety and mild  dementia.  Her medicines are reviewed in her chart at length.   ALLERGIES:  SULFA.   Past surgical history is reviewed, which includes most recently back  surgery which was a two-level fusion.  She still takes Vicodin for this  on an intermittent basis.   SOCIAL HISTORY:  She lives alone.   On examination, the patient is alert and oriented, no acute distress.  VITAL SIGNS:  Stable.  HEENT:  Examination is within normal limits, and  the patient's lower extremity examination is benign.  She has no signs  or symptoms of infection, dystrophy or vascular compromise.  She has an  obvious deformity about the right wrist with soft compartments.   Her left elbow has a significant skin injury, but no evidence of  fracture dislocation or space-occupying lesion.  I have viewed this at  length in her findings.  LOWER EXTREMITY EXAMINATION:  Benign.  ABDOMEN:  Nontender, nondistended.  Chest:  Is clear.   The patient's x-rays show a comminuted, distal radius fracture.   IMPRESSION:  1. Distal radius fracture, right upper extremity, displaced,      comminuted.  2. Left elbow abrasions.   PLAN:  I have discussed her  findings, we have gone ahead and cleansed  the left elbow.  Following this, I discussed with her the right wrist  predicament, and she then underwent a hematoma block, followed by  manipulative reduction and finger trap traction.  Reduction was  obtained, sugar-tong cast applied, and following this, I have discussed  with her the findings and plans for future.  The patient's postreduction  x-rays showed marked improvement.  I do feel she will have some degree  of settling, given her age and osteoporosis.  I would recommend  conservative measures if the reduction holds.  If this worsens  dramatically, consideration for operative intervention would be given  and she and her daughter understand this.  I have discussed with them  the dos and don'ts etc.  She was discharged on vitamin C 500 mg twice  a day, Vicodin 5 mg, one to two, every 4 to 6 hours p.r.n. pain p.o. and  Robaxin 500 mg, one p.o. every 6 to 8 hours.  If she has any problems,  she will notify me,  otherwise we will plan for ice, elevation, finger  range of motion, followup in 7 days, and notify me should any problems  occur.  I gave her my cell phone number and my home number to call me  should any problems develop.   She will notify should any problems occur.  All questions have been  encouraged and answered.           ______________________________  Dionne Ano. Everlene Other, M.D.     Nash Mantis  D:  09/23/2007  T:  09/24/2007  Job:  [id number]

## 2011-02-13 NOTE — Op Note (Signed)
[name] NO.:  [id number]   MEDICAL RECORD NO.:  [id number]          PATIENT TYPE:  INP   LOCATION:  3172                         FACILITY:  [facility]   PHYSICIAN:  Danae Orleans. Venetia Maxon, M.D.  DATE OF BIRTH:  September 11, 1916   DATE OF PROCEDURE:  10/29/2006  DATE OF DISCHARGE:                               OPERATIVE REPORT   PREOPERATIVE DIAGNOSIS:  Spondylolisthesis, scoliosis, stenosis,  herniated lumbar disk, degenerative disk disease, L2 through S1 levels.   POSTOPERATIVE DIAGNOSIS:  Spondylolisthesis, scoliosis, stenosis,  herniated lumbar disk, degenerative disk disease, L2 through S1 levels.   PROCEDURE:  1. Exploration of fusion, L3 through S1.  2. Decompressive laminectomy and diskectomy, L2-3.  3. Pedicle screw fixation, L2 through L3 levels.  4. Posterolateral arthrodesis, L2 through L5 levels, with autogenous      local bone graft and bone morphogenic protein with Vitoss strips.   SURGEON:  Danae Orleans. Venetia Maxon, M.D.   ASSISTANT:  Stefani Dama, M.D. and [place], RN   ANESTHESIA:  General endotracheal anesthesia.   BLOOD LOSS:  500 mL with 300 mL of Cell Saver blood returned to the  patient.   COMPLICATIONS:  None.   DISPOSITION:  Recovery.   INDICATIONS:  [name] is an 77 year old woman with lumbar  spondylolisthesis, scoliosis, stenosis, herniated lumbar disk at L2-3.  She has previously undergone 10 years ago a lumbar fusion, L3 through  sacral levels, from which she did well.  She now is unable to stand or  bear weight without significant left-sided back and leg pain.  It was  elected to take her to surgery for decompression and exploration of  fusion.   PROCEDURE:  [name] was brought to the operating room.  Following  the satisfactory and uncomplicated option of general endotracheal  anesthesia and placement of intravenous lines and a Foley catheter, the  patient was placed in a prone position on the operating table.  Her  low  back was then prepped and draped in the usual sterile fashion.  The area  of planned incision was infiltrated with 0.25% Marcaine and 0.5% and  1:200,000 epinephrine.  The previous incision was reopened from L2 to  the sacral levels and carried through the lumbar dorsal fascia to the L2  transverse processes, and the previously-placed Aesculap hardware was  exposed.  This was then removed after investing soft tissue and bone  overgrowth was removed overlying the hardware.  The area of previous  instrumentation was then inspected and there appeared to be solid bone  graft growth from L3 through the sacral levels with no evidence of  instability or movement at these levels.  The previously-placed L3  screws were 6.5 x 40-mm screws, and all screws were removed.  The left  side of the spinal canal was decompressed after a localizing x-ray  confirmed correct level and a hemilaminectomy of L2 was then performed.  The lateral recess of the spinal canal and spinal canal dura was then  decompressed and both the L2 and L3 nerve roots were widely  decompressed.  There was a ridge of  degenerated herniated disk material,  and this was removed with the osteophyte tool and the spinal canal was  decompressed.  Additional herniated disk material was also removed with  resultant significant decompression of the left L3 root.  It was elected  not to place interbody cages because of the patient's advanced age and  relative osteoporosis and because it was felt that there would be too  much required in terms of bone removal to gain access to the interspace.  Consequently, after decompression was performed pedicle screws were  placed with 5.5 x 45-mm screws at L2 and 6.5 x 55 mm screws at L3.  All  screws had excellent purchase and their positioning was confirmed on AP  and lateral fluoroscopy.  Posterolateral arthrodesis was then performed  from L2 to the L5 levels using Vitoss strips, morcellized bone   autograft, and bone morphogenic protein.  The rods were then placed  overlying the screws and locked down in situ.  Prior to placing bone  graft the wound was irrigated with bacitracin saline.  Soft tissues were  inspected and found to be in good repair.  There was excellent  hemostasis.  The lumbar dorsal fascia was then closed with #1 Vicryl  sutures, the subcutaneous tissues were re approximated with 2-0 Vicryl  interrupted, inverted sutures and skin edges were approximated with  interrupted 3-0 Vicryl subcuticular stitch.  The wound was dressed with  Benzoin, Steri-Strips, Telfa gauze and tape.  The patient was extubated  in the operating room and taken to the recovery room in stable and  satisfactory condition, having tolerated her operation well.  Counts  were correct at the end of the case.      Danae Orleans. Venetia Maxon, M.D.  Electronically Signed     JDS/MEDQ  D:  10/29/2006  T:  10/29/2006  Job:  [id number]

## 2011-02-13 NOTE — H&P (Signed)
[name], [name] NO.:  [id number]   MEDICAL RECORD NO.:  [id number]          PATIENT TYPE:  INP   LOCATION:  0104                         [facility]ACILITY:  [facility]   PHYSICIAN:  [name], M.D.  DATE OF BIRTH:  November 24, 1915   DATE OF ADMISSION:  02/12/2005  DATE OF DISCHARGE:                                HISTORY & PHYSICAL   CHIEF COMPLAINT:  Short of breath, coughing.   HISTORY OF PRESENT ILLNESS:  An 77 year old white female currently residing  in an assisted living facility in [place], [place] under the  primary care of [name].  She has become increasingly dyspneic over  the last 8-9 days, and in particular over the last 24 hours.  The patient  had illness that started as a cold about 8-9 days ago, but has increased  cough, chest rattle, severe dyspnea, and temperature of 102 degrees this  evening.  She has been producing a light amount of sputum, which has been  yellow in color.  There has been no hemoptysis.  She denies any chest pain.   PAST HISTORY:  1.  Asthma.  2.  Emphysema.  3.  COPD.  4.  Gout.  5.  Noninsulin-dependent diabetes mellitus.  6.  Dementia.  7.  Hypertension.  8.  Depression and anxiety.  9.  Urinary tract infection.  10. Osteoarthritis.  11. Spinal stenosis.  12. Unstable gait.  13. Insomnia.  14. In 1995, brief episodes of horizontal diplopia.  15. In 1993, right ischemic optic neuropathy.  16. Cerebral atrophy.  17. In 2003, excision of melanoma of the left face.   SURGERY:  1.  Lumbar spine stenosis with multiple screws and repair of the lumbar      stenosis.  2.  In 2003, excision of melanoma of the left face.  3.  Carpal tunnel release.  4.  Reduction mammoplasty.  5.  Cesarean sections.   PROCEDURES:  November 2005 - MRI of the brain and MRA of the brain showed  small vessel disease, atrophy, and mild intracranial atherosclerosis.   CONSULTANTS LOCALLY:  [name], M.D.   MEDICATIONS:  1.  Verapamil SR 240 mg daily.  2.  Singulair 10 mg h.s.  3.  Benazepril 10 mg daily.  4.  Darvocet-N 100 one q.4h. p.r.n. pain.  5.  Allopurinol 300 mg daily.  6.  Aricept 10 mg daily.  7.  Remeron 15 mg h.s.  8.  Albuterol 2.5 mg in normal saline via nebulizer p.r.n.   FAMILY HISTORY:  Noncontributory.   SOCIAL HISTORY:  The patient moved to [facility] in the fall of 2005.  She  lives in [place] [facility].  Her daughter lives locally.  Her name is [name], and is available at [phone].  The patient is widowed.  She has  occasional wine usage in the past, but no tobacco.  She has a living will.  She would allow ventilator support in the case of deteriorating  respirations; however, she refuses cardioversion.   REVIEW OF SYSTEMS:  GENERAL:  Doing well with assisted living until her  current illness.  Family had been planning to move her to [facility], an  independent living facility for older adults.  Gait is improved.  Weight has  been stable.  Appetite has been good.  HEENT:  History of diplopia.  Denies  any current problems with pain, discomfort, or other ocular symptoms.  CHEST:  History of asthma.  See history of present illness.  HEART:  Coronary artery disease history.  No recent chest pains or history of  angina.  GASTROINTESTINAL:  Denies heartburn, reflux, dyspepsia, history of  ulcer or GI bleeds.  Denies any history of diverticulosis.  GENITOURINARY:  History of renal insufficiency, and was admitted in fall of 2005 with a  urinary tract infection.  Is unaware of any problems of renal stones.  Denies incontinence.  MUSCULOSKELETAL:  Chronic back pains, which are  improving.  ENDOCRINE:  History of diabetes mellitus, but not on any current  medications.  NEUROLOGIC:  Diminished memory.  Currently on Aricept.   PHYSICAL EXAMINATION:  VITAL SIGNS:  Temperature 102 degrees, pulse 99 and  regular, blood pressure 182/86, O2 saturation 92% on 2 liters oxygen per  minute.   GENERAL:  Alert, cooperative, oriented, elderly female who is  conversational.  SKIN:  Without petechiae, purpura, or bruising.  NODES:  None palpable, cervical, axillary, inguinal, or other areas.  HEENT:  She has prescription lenses.  Sclerae and conjunctivae are clear.  There is no nystagmus.  Pupils equal, round and reactive to light.  Hearing  - pinnae, external auditory canals, and tympanic membranes appear normal.  Oropharynx shows no acute lesions.  NECK:  Without thyromegaly, nodules, mass, or bruit.  CHEST:  Bilateral rhonchi.  Dyspneic and tachypneic on oxygen.  Using  accessory muscles of respiration.  BREASTS:  Status post reduction mammoplasty.  No nodules.  HEART:  Tachycardia.  No gallop, murmur, click, rub, heave, or thrill.  ABDOMEN:  No organomegaly, mass, or bruit.  GENITAL/RECTAL:  Deferred.  MUSCULOSKELETAL:  Grossly normal, except for a scar in the lumbosacral area.  There was no deformities of the extremities.  She may have some mild  osteoarthritic changes of the hands.  NEUROLOGIC:  Cranial nerves intact.  She has a moderate tremor at rest,  compatible with a benign essential tremor.  No evidence of asterixis.  Speech is clear.  She is mildly forgetful.  CIRCULATION:  2+ DP and PT.  No evidence of edema at this time.   CHEST X-RAY:  Left lower lobe infiltrate and cardiomegaly.   EKG:  Left ventricular hypertrophy.  Premature atrial contractions.   LABORATORY DATA:  Basic metabolic panel normal.  CBC revealed white count of  13,300, hemoglobin 13.5, platelets 243,000.   PROBLEMS AND PLANS:  1.  Pneumonia.  Will receive parenteral Rocephin and oral Zithromax.      Guaifenesin will be used to thin sputum.  Albuterol and Atrovent are      used.  She will be continued on Singulair.  She will receive      dexamethasone 10 mg IV in the ER.  I am going to hold off on giving     additional steroids at this time until we see what the initial response      is  overnight.  2.  Hypertension.  Will continue verapamil and benazepril.  3.  Noninsulin-dependent diabetes mellitus.  Will watch glucose.  4.  Dementia.  Will continue Aricept.      AGG/MEDQ  D:  02/12/2005  T:  02/13/2005  Job:  [id number]

## 2011-02-13 NOTE — H&P (Signed)
[name], [name] NO.:  [id number]   MEDICAL RECORD NO.:  [id number]          PATIENT TYPE:  EMS   LOCATION:  ED                           FACILITY:  [facility]   PHYSICIAN:  Karlene Einstein, M.D.DATE OF BIRTH:  Oct 10, 1915   DATE OF ADMISSION:  07/24/2004  DATE OF DISCHARGE:                                HISTORY & PHYSICAL   CHIEF COMPLAINT:  Fever and lethargy.   HISTORY OF PRESENT ILLNESS:  An 77 year old Caucasian female who is a  resident at [facility], who was sent over to [facility] ER secondary  to above complaint.  Per patient, she was so extremely lethargic and weak  today that she cannot remember exactly what happened.  Currently in the ER  after some fluid resuscitation, she is more alert and responsive.  She  states that 2 days ago she had dental work done and was prescribed  amoxicillin, which made her very nauseated and generally not feel good.  Therefore, she lost her appetite, and has not been eating or drinking much.  Today, she felt very weak and lethargic, and later spiked a fever, at which  point she was sent to the hospital.  Currently, she says that she feels a  little bit better, but feels very weak and unable to stand up.  She denies  chest pain, shortness of breath, abdominal pain, dizziness.  She does have  extreme nausea, but no vomiting.   REVIEW OF SYSTEMS:  GENERAL:  She had fever, fatigue, and weakness.  SKIN:  No rash, itching, or wound.  HEENT:  Eyes - denies change in vision, dry  eyes, or eye pain.  Ears - no change in hearing, ringing, or earache.  Nose  - denies nasal congestion.  No epistaxis.  Mouth and throat - denies  gingival pain, bleeding, or hoarseness.  RESPIRATORY:  Denies dyspnea at  rest or with exertion, cough, wheezing, or hemoptysis.  CARDIAC:  Denies  chest pain or palpitations.  GI:  Denies abdominal pain, vomiting, diarrhea,  or constipation, but she does have extreme nausea.  GU:  Denies dysuria,  frequency, or hematuria.  MUSCULOSKELETAL:  She feels very weak, but denies  muscle pain or joint pain.  She does have back pain secondary to her spinal  stenosis.  CIRCULATION:  Denies edema, claudication, or varicosities.  NEUROLOGIC:  Denies dizziness, numbness, or headaches.  PSYCHIATRIC:  She  has a chronic anxiety disorder, but no paranoia or agitation.  ENDOCRINE:  Denies polyuria, polydipsia.  HEME/LYMPH:  Denies excessive bruising,  petechiae.   PAST MEDICAL HISTORY:  1.  Hypertension.  2.  Asthma.  3.  Gout.  4.  Anxiety disorder.  5.  Spinal stenosis.  6.  History of renal insufficiency.   SOCIAL HISTORY:  She is currently a resident at [facility].  She  drinks wine occasionally.  No history of tobacco use or alcohol abuse.   FAMILY HISTORY:  Significant for diabetes.   ALLERGIES:  No known drug allergies.   CURRENT MEDICATIONS:  1.  Allopurinol 300 mg daily.  2.  Lotensin 10  mg daily.  3.  Verapamil SR 240 mg daily.  4.  Klonopin 1 mg b.i.d.  5.  Pulmicort 0.5 mg per 2 ml, inhale one unit via nebulizer b.i.d.  6.  Atrovent 0.02 one units q.i.d. via nebulizer.  7.  Singulair 10 mg q.h.s.  8.  Lasix 20 mg daily p.r.n.  9.  Colchicine 0.6 mg b.i.d. p.r.n.   PHYSICAL EXAMINATION:  VITAL SIGNS:  Temperature 102, blood pressure 148/63,  pulse 56, respiratory rate 22, pulse oximetry 91% on room air.  GENERAL:  Well-nourished, in no acute distress.  Normal body habitus.  SKIN:  Warm and dry.  No suspicious lesions or rash.  HEENT:  Head in size and contour.  No evidence of trauma.  Eyes - lids open  and close normally.  Pupils equal and sluggishly reactive to light.  Conjunctivae clear.  Sclerae are white.  Mouth and throat - lips are without  lesions.  Oropharynx moderately dry, but no lesions.  Uvula elevated  midline.  Tongue is normal in size and shape and color.  No lesions.  NECK:  Supple.  No carotid bruits, thyromegaly, nodules, masses, or  tenderness.   LYMPHATIC:  No cervical lymphadenopathy.  RESPIRATORY:  Breathing is even and unlabored.  Breath sounds are clear to  auscultation bilaterally.  HEART:  Regular rate and rhythm.  No murmurs, gallops, or rubs.  ABDOMEN:  Soft, nontender, nondistended.  Decreased bowel sounds.  No  hepatosplenomegaly, masses, or hernias.  MUSCULOSKELETAL:  No deformities.  Movement of each extremity is full and  painless.  Strength 5/5 at each extremity.  Circulation - pedal pulses are  +2.  No edema of the legs.  NEUROLOGIC:  Cranial nerves II-XII grossly intact.  Deep tendon reflexes  cannot be elicited.  No tremors.  Speech is clear.  PSYCHIATRIC:  Alert and oriented x3.  Affect and behavior are appropriate.   LABORATORY DATA:  Urinalysis - nitrite __________ esterase small, WBC's 3-6.  White count 7.5, hemoglobin 12.3, platelets 249.  Sodium 134, potassium 3.8,  CO2 of 23, glucose 105, BUN 15, creatinine 0.9.  LFT's normal.  Chest x-ray  revealed no acute distress.  I have independently reviewed the chest x-ray  image.  Blood culture x2 pending.  Urine culture is pending.   ASSESSMENT AND PLAN:  1.  Acute pyelonephritis, mild pyuria, and fever.  Since there is no other      source for fever, will treat with Cipro.  Follow up on blood cultures      and urine cultures.  2.  Weakness and dehydration, due to diminished p.o. intake for the last 2      days.  Will hydrate and follow clinically.  3.  Hypertension, stable.  Will continue current medications.  Hold Lasix.  4.  Asthma, stable.  Continue current medications.  5.  History of gout.  Continue Allopurinol.  6.  Severe anxiety disorder.  Continue Klonopin.    GD/MEDQ  D:  07/25/2004  T:  07/25/2004  Job:  [id number]

## 2011-02-13 NOTE — Discharge Summary (Signed)
NAMESHAMONICA, SCHADT NO.:  [id number]   MEDICAL RECORD NO.:  [id number]          PATIENT TYPE:  INP   LOCATION:  0357                         FACILITY:  [facility]   PHYSICIAN:  Altha Harm, MDDATE OF BIRTH:  [birth year]   DATE OF ADMISSION:  02/12/2005  DATE OF DISCHARGE:  02/17/2005                                 DISCHARGE SUMMARY   DISPOSITION:  Home.   DISCHARGE DIAGNOSES:  1.  Pneumonia.  2.  Acute exacerbation of chronic obstructive pulmonary disease with      hypoxemia.  3.  Dementia.  4.  Emphysema.  5.  Gout.  6.  Non-insulin dependent diabetes.  7.  Hypertension.  8.  Depression with anxiety.  9.  Spinal stenosis.  10. __________  11. Insomnia.   DISCHARGE MEDICATIONS:  1.  Azithromycin 500 mg p.o. daily x4 more days.  2.  Singulair 10 mg p.o. h.s.  3.  Pulmicort 0.5 mg p.o. b.i.d.  4.  Benzopril 10 mg p.o. daily.  5.  Clonazepam 1 mg p.o. b.i.d.  6.  Allopurinol 300 mg p.o. daily.  7.  Aricept 10 mg p.o. daily.  8.  Verapamil SR 240 mg p.o. daily.  9.  Darvocet-N 100 one tablet p.o. q.4h. p.r.n.  10. Remeron 50 mg p.o. q.h.s.  11. Albuterol 2.5 mg nebulizers p.r.n.  12. Prednisone taper over eight days; 40 mg p.o. daily x2, then 30 mg p.o.      daily x2, then 20 mg p.o. daily x2, then 10 mg p.o. daily x2.   CHIEF COMPLAINT:  Shortness of breath and coughing.   HISTORY OF PRESENT ILLNESS:  This is an 77 year old female who was in an  assisted living facility in [place], [place], under the primary  care of Dr. Kerry Dory.  Patient became increasing dyspneic over the last  eight to nine days and was started on a cold medicine about eight to nine  days ago.  The patient had increased cough, chest congestion, temperature of  102.  Patient was brought to the emergency room for further evaluation.   HISTORY OF PRESENT ILLNESS:  PROBLEM #1 -  PNEUMONIA:  Patient was treated  with ceftriaxone and azithromycin.  Patient will be  sent home on  azithromycin to complete four more days.   PROBLEM #2 -  ACUTE EXACERBATION OF CHRONIC OBSTRUCTIVE PULMONARY DISEASE:  Patient was treated with p.o. steroids, albuterol and Atrovent while  hospitalized.  Patient's clinical course improved.  Patient is being sent  home on a prednisone steroid as elucidated above.   PROBLEM #3 -  HYPERTENSION:  Blood pressure remained stable throughout her  hospital stay.   PROBLEM #4 -  DIABETES TYPE 2:  The patient currently is a diet controlled  diabetic, however, with steroids the patient had some elevation of her blood  sugars which were treated with sliding scale.  Patient's blood sugars have  been remarkably stable despite the steroids and I do not feel the patient  will need to go home on any hypoglycemics or insulin to regular her sugars  associated with prednisone use.  PROBLEM #5 -  DEMENTIA:  Remained stable while hospitalized.   All other chronic problems were stable while hospitalized.  Today the  patient is afebrile, vital signs are stable.  Patient is 96% on room air,  however, with ambulation late yesterday afternoon, the patient's sats  dropped down to 88%.  Patient will be again evaluated by nursing this  morning to see whether or not she will require home O2.  If the patient does  require home O2, it will be __________ by case management and will have home  health nursing going to wean her off of the O2.  If not, the patient will go  home without any oxygen.  Lungs clear to auscultation with very, very  minimal wheezing in the left lower lobe.  She has no increased respiratory  effort.  Normal S1 and S2 with no murmurs, rubs, or gallops noted.  Abdomen  is soft, nontender and nondistended. No masses, no hepatosplenomegaly.   CONDITION ON DISCHARGE:  Stable.   DISPOSITION:  Back to the assisted living facility.   ACTIVITY:  No restrictions.   DIET:  A 2 g sodium diet.      MAM/MEDQ  D:  02/17/2005  T:   02/17/2005  Job:  [id number]

## 2011-02-13 NOTE — Discharge Summary (Signed)
[name], [name] NO.:  [id number]   MEDICAL RECORD NO.:  [id number]          PATIENT TYPE:  INP   LOCATION:  0447                         FACILITY:  [facility]   PHYSICIAN:  Maxwell Caul, M.D.DATE OF BIRTH:  May 02, 1916   DATE OF ADMISSION:  07/24/2004  DATE OF DISCHARGE:  07/29/2004                                 DISCHARGE SUMMARY   HISTORY OF PRESENT ILLNESS:  This is an 77 year old woman who was a recent  admission to [facility].  She transferred apparently from [place]  one or two months ago.  She was sent to the emergency room with fever and  lethargy.  She had been unable to eat for two days.  Two days prior to this,  she had some dental work done and was prescribed amoxycillin which made her  nauseated.  She lost her appetite and felt that she had become dehydrated.  There is no abdominal pain.  Nausea is present but no vomiting.   PAST MEDICAL HISTORY:  1.  Hypertension.  2.  Asthma.  3.  Gout.  4.  Anxiety.  5.  Spinal stenosis.  6.  History of renal insufficiency.   SOCIAL HISTORY:  As mentioned, she is a resident at [facility].  She  has no history of tobacco or alcohol abuse.  The patient tells me she moved  from [place], [place] two months ago.   PHYSICAL EXAMINATION:  GENERAL:  Well-nourished, in no acute distress.  VITAL SIGNS:  Temperature 102, blood pressure 148/63, pulse 76, respirations  91% on room air.  SKIN:  Warm and dry.  No suspicious lesions.  RESPIRATORY:  Clear air entry.  No wheezing or crackles were noted.  Work of  breathing seems normal.  CARDIAC:  Heart sounds are normal.  No murmurs.  No increase in jugular  venous pressure.  ABDOMEN:  Soft, nontender.  MUSCULOSKELETAL:  No gross deformities.  NEUROLOGIC:  Nonfocal.  PSYCHIATRIC:  Alert and oriented x1.   LABORATORY DATA AND OTHER INVESTIGATIONS:  Blood cultures were negative.  White count on admission 7.5, hemoglobin 12.3,  differential count normal.  Sodium 134, potassium 3.8, albumin 3.3.  Liver function tests were normal.  Her TSH was normal at 0.594.  Urinalysis revealed few bacteria, negative  nitrite, small leukocyte esterase.  Urine culture was no growth at one day.   MRI of the brain was done since the patient has a history of melanoma which  showed no acute infarct or abnormal intracranial enhancing lesions.  There  was mild-to-moderate small vessel disease, mild paranasal sinuses, mucosal  thickening without a fluid level.  MR angiogram revealed mild intracranial  atherosclerotic-type changes most notable in the right anterior cerebral  artery A1 segment.  Chest x-ray showed no active disease.   HOSPITAL COURSE:  This patient was admitted with a working diagnosis of  pyelonephritis.  She was put on empiric ciprofloxacin.  She was weak, with  no oral intake for two days.  She was felt to be mildly dehydrated.  The  rest of her medical problems, including her hypertension, asthma, chronic  anxiety on Klonopin, and gout all seemed stable.   In hospital, she was treated with ciprofloxacin initially IV; however,  eventually, she defervesced and began to feel better.  The possibility that  this was an acute viral syndrome was entertained.  She was felt to be  dehydrated.  She was rehydrated with isotonic fluids.   Her other medical problems were stabilized.  Her potassium and magnesium  replacements were given.   On July 29, 2004, she seems stable enough for discharge.  She appeared  euvolemic, and her exam was nonfocal.  I do not have any idea whether the  patient was on antibiotics prior to admission; therefore, I will complete a  course Cipro, although this all could have been a viral syndrome with  accompanied dehydration.   DISCHARGE DIAGNOSES:  1.  Acute febrile illness, possible urinary tract infection.  See discussion      above.  2.  Dehydration secondary to #1.  3.  Anxiety disorder  with I think significant depression.  I am going to      start her on Remeron 7.5 mg q.h.s. at discharge.  I am generally not      happy with twice daily clonidine in older people; however, the patient      tells me she has been on this for a long time.  Therefore, I will not      alter this in acute setting.  4.  Anorexia/failure to thrive.  There is no clear medical explanation for      this.  As mentioned, I am going to start her on Remeron for the      combination of anorexia and depression.  She will need to follow up with      a physician in our office next week.   DISCHARGE MEDICATIONS:  1.  Allopurinol 300 daily.  2.  Lotensin 10 daily.  3.  Verapamil 240 daily.  4.  Klonopin 1 mg b.i.d.  5.  Pulmicort Respules 0.5 mg/2 mL b.i.d.  6.  Atrovent 0.5 q.i.d.  7.  Singulair 10 q.h.s.  8.  Aspirin 81 daily.  9.  Cipro 500 b.i.d. to continue for a further three days post-discharge.   FOLLOW UP:  The patient is to be seen in the office next week.  I would like  her assessed for depression.  She will need a basic metabolic panel, CBC,  and magnesium level.   CONDITION ON DISCHARGE:  The patient was discharged in stable condition.  Home care will be arranged to follow her with physical therapy.  Also, nurse  assessments at her assisted living facility.      MGR/MEDQ  D:  07/29/2004  T:  07/29/2004  Job:  [id number]

## 2011-02-13 NOTE — Discharge Summary (Signed)
[name], [name] NO.:  [id number]   MEDICAL RECORD NO.:  [id number]          PATIENT TYPE:  INP   LOCATION:  3041                         FACILITY:  [facility]   PHYSICIAN:  Danae Orleans. Venetia Maxon, M.D.  DATE OF BIRTH:  05-18-1916   DATE OF ADMISSION:  10/29/2006  DATE OF DISCHARGE:  11/09/2006                               DISCHARGE SUMMARY   REASON FOR ADMISSION:  Spondylolisthesis and scoliosis with lumbar  stenosis and herniated lumbar disk.   FINAL DIAGNOSES:  Spondylolisthesis and scoliosis with lumbar stenosis  and herniated lumbar disk.   ADDITIONAL DIAGNOSES:  1. Hypertension.  2. Asthmatic bronchitis.   HISTORY OF PRESENT ILLNESS:  [name] is an 77 year old woman who  10 years ago had undergone L3 through sacral fusion. She developed  retrolisthesis of L2 and L3 with scoliosis and severe leg pain. After  she did not improve with conservative measures, it was elected to take  her surgery for exploration of her prior fusion and for decompression  and fusion of the L2-3 levels. This was accomplished without  complication. Postoperatively, she had some problems with fever and  hypertension and asthmatic bronchitis. She was observed in the intensive  care unit and was treated for atelectasis and given volume repletion.  She did better with that. She was doing better on February 4 and was  transferred to the regular floor at that time. She was gradually  mobilized with therapy and a back brace. She had improvement of her  preoperative left leg pain and was gradually making steady progress. She  was evaluated by the rehabilitation service and felt to be in good of  shape for inpatient rehabilitation. It was therefore elected that she be  placed in a short-term skilled nursing facility for rehabilitation.   Her discharge medications include:  1. Singulair 10 mg p.o. every night.  2. Remeron 15 mg every night.  3. Aricept 10 mg p.o. every night.  4.  Cyclobenzaprine 10 mg p.o. t.i.d. p.r.n.  5. Multivitamin.  6. Docusate sodium.  7. Clonazepam 1 mg p.o. every night.  8. Protonix 40 mg daily.  9. Xopenex inhaler 0.63 mg q.6h.  10.Pulmicort inhaler 0.25 mg q.6h.  11.Diovan 80 mg p.o. daily.  12.Guaifenesin 1200 mg p.o. b.i.d.  13.Solu-Medrol 40 mg IV q.12h. to be discontinued.  14.Tiazac Cardizem CD extended release 120 mg daily.  15.Klonopin 1 mg p.o. daily p.r.n.  16.Diazepam 5 mg p.o. q.6h. p.r.n.  17.Zofran 4 mg IV q.4h. p.r.n.  18.Albuterol insulin inhaler 2.5 mg every 6 hours as needed.  19.Hydrocodone/APAP 5/325 one to two every 4 hours as needed for pain.  20.Magic mouthwash 5 mL p.o. q.i.d. #10 days.      Danae Orleans. Venetia Maxon, M.D.  Electronically Signed     JDS/MEDQ  D:  11/09/2006  T:  11/09/2006  Job:  [id number]

## 2011-02-13 NOTE — Assessment & Plan Note (Signed)
OFFICE VISIT   ZIRKLE, Roxsana A  DOB:  June 01, 1916                                       02/26/2007  CHART#:[id number]   Ms. Formanek returns today for further evaluation of her bilateral lower  extremity edema.  She had a venous duplex exam today to examine  competency and patency.  She had no evidence of DVT.  The deep end  superficial systems were competent. Of note she did have non-  vascularized masses in both groins which most likely represent a  collection of lymph nodes.  On exam today blood pressure is 202/80,  pulse is 54 and regular.  Lower extremities, she still has significant  edema below the knee on both sides.  She also has erythematous macules  over the pretibial region on both sides.  She states that she has been  scratching these areas.  She has been applying some sort of cream but  did not remember what that was to treat this.  Overall the leg edema is  unchanged.  Certainly there is more inflammation than on her last office  visit. Her daughter states that they have tried to place compression  stockings on her but the patient is unable to do this on her own.  She  only wears these when her daughter is able to put them on for her.  She  had a transthoracic echocardiogram performed but the results are not  available for review at this time.  We will follow-up on those.  She has  been extremely hypertensive on both office visits.  I will leave that to  Dr. Rondel Baton discretion on treating hypertension.  As far as her leg  edema is concerned it does not seem to be vascular in nature.  It may be  somewhat due to lymphatic obstruction.  However, this does not really  appear to relate to the back operation.  The swollen lymph nodes could  be further evaluated by Dr. Hyacinth Meeker with CT scan if he so chooses.  I  discussed this with the patient and her daughter today.  I also advised  the patient that she should see a dermatologist to try to  heal up  the areas of inflammation on the skin on both sides.  She will  follow-up on an as needed basis since she does not seem to have any  vascular component to her leg edema.   Janetta Hora. Fields, MD  Electronically Signed   CEF/MEDQ  D:  02/26/2007  T:  02/28/2007  Job:  [id number]   cc:   Bertram Millard. Hyacinth Meeker, M.D.

## 2011-10-20 ENCOUNTER — Emergency Department (HOSPITAL_COMMUNITY)
Admission: EM | Admit: 2011-10-20 | Discharge: 2011-10-20 | Disposition: A | Discharge status: Home / Self Care | Payer: PRIVATE HEALTH INSURANCE | Attending: Emergency Medicine | Admitting: Emergency Medicine

## 2011-10-20 ENCOUNTER — Emergency Department (HOSPITAL_COMMUNITY): Payer: PRIVATE HEALTH INSURANCE

## 2011-10-20 ENCOUNTER — Encounter (HOSPITAL_COMMUNITY): Payer: Self-pay | Admitting: *Deleted

## 2011-10-20 DIAGNOSIS — J4489 Other specified chronic obstructive pulmonary disease: Secondary | ICD-10-CM | POA: Insufficient documentation

## 2011-10-20 DIAGNOSIS — T84029A Dislocation of unspecified internal joint prosthesis, initial encounter: Secondary | ICD-10-CM | POA: Insufficient documentation

## 2011-10-20 DIAGNOSIS — Z96649 Presence of unspecified artificial hip joint: Secondary | ICD-10-CM | POA: Insufficient documentation

## 2011-10-20 DIAGNOSIS — Y831 Surgical operation with implant of artificial internal device as the cause of abnormal reaction of the patient, or of later complication, without mention of misadventure at the time of the procedure: Secondary | ICD-10-CM | POA: Insufficient documentation

## 2011-10-20 DIAGNOSIS — I10 Essential (primary) hypertension: Secondary | ICD-10-CM | POA: Insufficient documentation

## 2011-10-20 DIAGNOSIS — Z79899 Other long term (current) drug therapy: Secondary | ICD-10-CM | POA: Insufficient documentation

## 2011-10-20 DIAGNOSIS — J449 Chronic obstructive pulmonary disease, unspecified: Secondary | ICD-10-CM | POA: Insufficient documentation

## 2011-10-20 HISTORY — DX: Chronic obstructive pulmonary disease, unspecified: J44.9

## 2011-10-20 HISTORY — DX: Essential (primary) hypertension: I10

## 2011-10-20 MED ORDER — FENTANYL CITRATE 0.05 MG/ML IJ SOLN
INTRAMUSCULAR | Status: AC
  Filled 2011-10-20: qty 2

## 2011-10-20 MED ORDER — PROPOFOL 10 MG/ML IV EMUL
INTRAVENOUS | Status: AC
  Filled 2011-10-20: qty 20

## 2011-10-20 MED ORDER — ONDANSETRON HCL 4 MG/2ML IJ SOLN
INTRAMUSCULAR | Status: AC
  Administered 2011-10-20: 4 mg
  Filled 2011-10-20: qty 2

## 2011-10-20 MED ORDER — PROPOFOL 10 MG/ML IV BOLUS
60.0000 mg | Freq: Once | INTRAVENOUS | Status: AC
  Administered 2011-10-20: 60 mg via INTRAVENOUS
  Filled 2011-10-20: qty 6

## 2011-10-20 MED ORDER — PROPOFOL 10 MG/ML IV EMUL: 200.0000 mg/h | Freq: Once | INTRAVENOUS | Status: DC

## 2011-10-20 MED ORDER — HYDROMORPHONE HCL PF 2 MG/ML IJ SOLN
2.0000 mg | Freq: Once | INTRAMUSCULAR | Status: AC
  Administered 2011-10-20: 2 mg via INTRAVENOUS
  Filled 2011-10-20: qty 1

## 2011-10-20 MED ORDER — FENTANYL CITRATE 0.05 MG/ML IJ SOLN
50.0000 ug | Freq: Once | INTRAMUSCULAR | Status: AC
  Administered 2011-10-20: 50 ug via INTRAVENOUS

## 2011-10-20 MED ORDER — FENTANYL CITRATE 0.05 MG/ML IJ SOLN
25.0000 ug | Freq: Once | INTRAMUSCULAR | Status: AC
  Administered 2011-10-20: 25 ug via INTRAVENOUS

## 2011-10-20 NOTE — ED Notes (Signed)
Pt from home.  Per EMS:  Pt was attempting to take socks off, bending over.  Pt felt (R) hip pop out of joint.  Pt does have shortening and rotation of the (R) leg.  Denies falling, denies LOC.  Pulses present.  22ga RFA, fentanyl given en route-pain now a 7/10.  Vitals stable.  120/58

## 2011-10-20 NOTE — ED Notes (Signed)
Pt back to baseline.  Talking with daughter.  Moving bilateral legs.

## 2011-10-20 NOTE — ED Notes (Signed)
See triage note for assessment.  Pulses present.

## 2011-10-20 NOTE — ED Notes (Signed)
Hip in place, family back at bedside.

## 2011-10-20 NOTE — ED Notes (Signed)
Pt resting.  Vitals stable.  No distress noted.

## 2011-10-20 NOTE — ED Provider Notes (Addendum)
History     CSN: [id number]  Arrival date & time 10/20/11  0023   First MD Initiated Contact with Patient 10/20/11 0044      Chief Complaint  Patient presents with  . Hip Pain  . Hip Injury    (Consider location/radiation/quality/duration/timing/severity/associated sxs/prior treatment) Patient is a 78 y.o. female presenting with hip pain. The history is provided by the patient, a relative and medical records.  Hip Pain   the patient is a 78 year old, female, with a prosthetic right hip.   She presents to the emergency department complaining of right hip pain.  She was bending over and felt a pop in her right hip.  She did not fall.  She has right hip pain, and no other pain.  Her last meal was around 5:30 this evening.  Past Medical History  Diagnosis Date  . COPD (chronic obstructive pulmonary disease)   . Hypertension     Past Surgical History  Procedure Date  . Hip surgery     (R) hip replacement    History reviewed. No pertinent family history.  History  Substance Use Topics  . Smoking status: Never Smoker   . Smokeless tobacco: Not on file  . Alcohol Use: No    OB History    Grav Para Term Preterm Abortions TAB SAB Ect Mult Living                  Review of Systems  Musculoskeletal:       Right hip pain  All other systems reviewed and are negative.    Allergies  Sulfa antibiotics  Home Medications   Current Outpatient Rx  Name Route Sig Dispense Refill  . ALBUTEROL SULFATE HFA 108 (90 BASE) MCG/ACT IN AERS Inhalation Inhale 2 puffs into the lungs every 6 (six) hours as needed. For breathing    . ALLOPURINOL 300 MG PO TABS Oral Take 300 mg by mouth daily.    Marland Kitchen CLONAZEPAM 1 MG PO TABS Oral Take 1 mg by mouth 2 (two) times daily.    . CYCLOBENZAPRINE HCL 10 MG PO TABS Oral Take 10 mg by mouth 3 (three) times daily as needed. For spasms    . DOCUSATE SODIUM 100 MG PO CAPS Oral Take 100 mg by mouth daily.    . DONEPEZIL HCL 10 MG PO TABS Oral Take 10  mg by mouth at bedtime as needed.    Marland Kitchen FLUTICASONE-SALMETEROL 250-50 MCG/DOSE IN AEPB Inhalation Inhale 1 puff into the lungs every 12 (twelve) hours.    . FUROSEMIDE 20 MG PO TABS Oral Take 20 mg by mouth every other day.    Marland Kitchen HYDROCHLOROTHIAZIDE 25 MG PO TABS Oral Take 25 mg by mouth daily.    Marland Kitchen HYDROCODONE-ACETAMINOPHEN 5-500 MG PO TABS Oral Take 1 tablet by mouth 2 (two) times daily.    Marland Kitchen LISINOPRIL 40 MG PO TABS Oral Take 40 mg by mouth daily.    Marland Kitchen MIRTAZAPINE 15 MG PO TABS Oral Take 15 mg by mouth at bedtime.    Marland Kitchen MONTELUKAST SODIUM 10 MG PO TABS Oral Take 10 mg by mouth at bedtime.    Carma Leaven M PLUS PO TABS Oral Take 1 tablet by mouth daily.    Marland Kitchen POTASSIUM CHLORIDE ER 10 MEQ PO TBCR Oral Take 10 mEq by mouth every other day.      BP 121/48  Pulse 63  Temp(Src) 98.5 F (36.9 C) (Oral)  Resp 18  SpO2 95%  Physical Exam  Constitutional: She is oriented to person, place, and time. She appears well-developed and well-nourished. No distress.       Uncomfortable appearing  HENT:  Head: Normocephalic and atraumatic.  Eyes: Conjunctivae are normal. Pupils are equal, round, and reactive to light.  Neck: Normal range of motion. Neck supple.  Cardiovascular: Normal rate.   Murmur heard. Pulmonary/Chest: Effort normal. No respiratory distress. She has no wheezes.  Abdominal: Soft. There is no tenderness.  Musculoskeletal:       Right lower extremity is shorter than the left lower extremity.  1+ dorsalis pedis pulse on the right side.  Neurological: She is alert and oriented to person, place, and time.  Skin: Skin is warm and dry.  Psychiatric: She has a normal mood and affect.    ED Course  Procedural sedation Performed by: Meliah Appleman P Authorized by: Weldon Inches, Jewell Ryans P Consent: Verbal consent obtained. Written consent obtained. Risks and benefits: risks, benefits and alternatives were discussed Consent given by: patient Patient understanding: patient states  understanding of the procedure being performed Patient sedated: yes Sedatives: propofol Vitals: Vital signs were monitored during sedation. Patient tolerance: Patient tolerated the procedure well with no immediate complications. Comments: Pt tolerated the procedure well. There was no pain. She woke up and returned to baseline.  xr showed successful relocation of prosthetic right hip  procedure time 15 minutes   (including critical care time) 78 year old, female, with right prosthetic hip complains of right hip pain after she leaned over and felt a pop.  We will establish IV and give analgesics, and will perform an x-ray.  I suspect she has a dislocation to  Labs Reviewed - No data to display Dg Hip Portable 1 View Right  10/20/2011  *RADIOLOGY REPORT*  Clinical Data: Pain.  Hip dislocation.  PORTABLE RIGHT HIP - 1 VIEW  Comparison: Plain film the right hip 12/21/2009.  Findings: The patient has a right total hip replacement.  The device is superiorly dislocated.  No fracture.  IMPRESSION: Dislocated right total hip.  Original Report Authenticated By: Bernadene Bell. Maricela Curet, M.D.     No diagnosis found.  4:11 AM Pt sleeping comfortably.   MDM  Right prosthetic hip dislocation - relocated by dr. Jerl Santos under procedural sedation conducted by me.    No complications.         Nicholes Stairs, MD 10/20/11 0532  Nicholes Stairs, MD 11/30/11 [phone]

## 2011-10-20 NOTE — ED Notes (Signed)
Pt given another blanket, emesis basin and repositioned.  No needs at this time.  Pt updated on POC.

## 2011-10-20 NOTE — ED Notes (Signed)
Pt tolerated the change from 2L O2 to room air. O2 sats remained above 98%. pts daughter arrived to take her home.

## 2011-10-20 NOTE — Procedures (Signed)
[name] was given Propofol for conscious sedation and with some significant force the hip reduced.  Plan is for several hours of observation and then discharge home to care of family including [name] who is here.

## 2012-09-24 ENCOUNTER — Emergency Department (HOSPITAL_COMMUNITY): Payer: PRIVATE HEALTH INSURANCE

## 2012-09-24 ENCOUNTER — Emergency Department (HOSPITAL_COMMUNITY)
Admission: EM | Admit: 2012-09-24 | Discharge: 2012-09-24 | Disposition: A | Discharge status: Home / Self Care | Payer: PRIVATE HEALTH INSURANCE | Attending: Emergency Medicine | Admitting: Emergency Medicine

## 2012-09-24 DIAGNOSIS — Y929 Unspecified place or not applicable: Secondary | ICD-10-CM | POA: Insufficient documentation

## 2012-09-24 DIAGNOSIS — Y939 Activity, unspecified: Secondary | ICD-10-CM | POA: Insufficient documentation

## 2012-09-24 DIAGNOSIS — J4489 Other specified chronic obstructive pulmonary disease: Secondary | ICD-10-CM | POA: Insufficient documentation

## 2012-09-24 DIAGNOSIS — W06XXXA Fall from bed, initial encounter: Secondary | ICD-10-CM | POA: Insufficient documentation

## 2012-09-24 DIAGNOSIS — S73006A Unspecified dislocation of unspecified hip, initial encounter: Secondary | ICD-10-CM | POA: Insufficient documentation

## 2012-09-24 DIAGNOSIS — I10 Essential (primary) hypertension: Secondary | ICD-10-CM | POA: Insufficient documentation

## 2012-09-24 DIAGNOSIS — J449 Chronic obstructive pulmonary disease, unspecified: Secondary | ICD-10-CM | POA: Insufficient documentation

## 2012-09-24 DIAGNOSIS — Z79899 Other long term (current) drug therapy: Secondary | ICD-10-CM | POA: Insufficient documentation

## 2012-09-24 DIAGNOSIS — S73004A Unspecified dislocation of right hip, initial encounter: Secondary | ICD-10-CM

## 2012-09-24 MED ORDER — FENTANYL CITRATE 0.05 MG/ML IJ SOLN
50.0000 ug | Freq: Once | INTRAMUSCULAR | Status: AC
  Administered 2012-09-24: 50 ug via INTRAVENOUS
  Filled 2012-09-24: qty 2

## 2012-09-24 MED ORDER — MORPHINE SULFATE 4 MG/ML IJ SOLN
4.0000 mg | Freq: Once | INTRAMUSCULAR | Status: AC
  Administered 2012-09-24: 4 mg via INTRAVENOUS
  Filled 2012-09-24: qty 1

## 2012-09-24 MED ORDER — PROPOFOL 10 MG/ML IV BOLUS
INTRAVENOUS | Status: AC
  Administered 2012-09-24: 50 mg via INTRAVENOUS
  Filled 2012-09-24: qty 20

## 2012-09-24 MED ORDER — PROPOFOL 10 MG/ML IV BOLUS
60.0000 mg | Freq: Once | INTRAVENOUS | Status: AC
  Administered 2012-09-24: 50 mg via INTRAVENOUS

## 2012-09-24 MED ORDER — ONDANSETRON HCL 4 MG/2ML IJ SOLN
4.0000 mg | Freq: Once | INTRAMUSCULAR | Status: AC
  Administered 2012-09-24: 4 mg via INTRAVENOUS
  Filled 2012-09-24: qty 2

## 2012-09-24 MED ORDER — HYDROCODONE-ACETAMINOPHEN 5-325 MG PO TABS: 1.0000 | ORAL_TABLET | ORAL | Status: DC | PRN

## 2012-09-24 NOTE — ED Notes (Signed)
Pt stats are 97% 2L. Pt alert and oriented x4.

## 2012-09-24 NOTE — Progress Notes (Signed)
Ap pelvis review - successful right hip reduction noted. Patient to follow-up with Dr. Yisroel Ramming

## 2012-09-24 NOTE — Progress Notes (Signed)
Orthopedic Tech Progress Note Patient Details:  [name] 1916/04/28 [id number]  Ortho Devices Type of Ortho Device: Knee Immobilizer   Haskell Flirt 09/24/2012, 4:10 AM

## 2012-09-24 NOTE — ED Provider Notes (Addendum)
History     CSN: [id number]  Arrival date & time 09/24/12  0029   First MD Initiated Contact with Patient 09/24/12 0040      Chief Complaint  Patient presents with  . Fall    (Consider location/radiation/quality/duration/timing/severity/associated sxs/prior treatment) Patient is a 78 y.o. female presenting with fall. The history is provided by the patient.  Fall The accident occurred less than 1 hour ago. The fall occurred from a bed. She fell from a height of 1 to 2 ft. The pain is present in the right hip. The pain is at a severity of 5/10. The pain is moderate. She was not ambulatory at the scene. Pertinent negatives include no visual change, no fever, no numbness, no abdominal pain, no bowel incontinence, no nausea, no headaches and no tingling. The symptoms are aggravated by activity. Treatment on scene includes medications.    Past Medical History  Diagnosis Date  . COPD (chronic obstructive pulmonary disease)   . Hypertension     Past Surgical History  Procedure Date  . Hip surgery     (R) hip replacement    No family history on file.  History  Substance Use Topics  . Smoking status: Never Smoker   . Smokeless tobacco: Not on file  . Alcohol Use: No    OB History    Grav Para Term Preterm Abortions TAB SAB Ect Mult Living                  Review of Systems  Constitutional: Negative for fever.  Gastrointestinal: Negative for nausea, abdominal pain and bowel incontinence.  Musculoskeletal: Positive for joint swelling and arthralgias.  Neurological: Negative for tingling, numbness and headaches.  All other systems reviewed and are negative.    Allergies  Sulfa antibiotics  Home Medications   Current Outpatient Rx  Name  Route  Sig  Dispense  Refill  . ALBUTEROL SULFATE HFA 108 (90 BASE) MCG/ACT IN AERS   Inhalation   Inhale 2 puffs into the lungs every 6 (six) hours as needed. For breathing         . ALLOPURINOL 300 MG PO TABS   Oral   Take  300 mg by mouth daily.         Marland Kitchen CLONAZEPAM 1 MG PO TABS   Oral   Take 1 mg by mouth 2 (two) times daily.         . CYCLOBENZAPRINE HCL 10 MG PO TABS   Oral   Take 10 mg by mouth 3 (three) times daily as needed. For spasms         . DOCUSATE SODIUM 100 MG PO CAPS   Oral   Take 100 mg by mouth daily.         . DONEPEZIL HCL 10 MG PO TABS   Oral   Take 10 mg by mouth at bedtime as needed.         Marland Kitchen FLUTICASONE-SALMETEROL 250-50 MCG/DOSE IN AEPB   Inhalation   Inhale 1 puff into the lungs every 12 (twelve) hours.         . FUROSEMIDE 20 MG PO TABS   Oral   Take 20 mg by mouth every other day.         Marland Kitchen HYDROCHLOROTHIAZIDE 25 MG PO TABS   Oral   Take 25 mg by mouth daily.         Marland Kitchen HYDROCODONE-ACETAMINOPHEN 5-500 MG PO TABS   Oral   Take 1 tablet  by mouth 2 (two) times daily.         Marland Kitchen LISINOPRIL 40 MG PO TABS   Oral   Take 40 mg by mouth daily.         Marland Kitchen MIRTAZAPINE 15 MG PO TABS   Oral   Take 15 mg by mouth at bedtime.         Marland Kitchen MONTELUKAST SODIUM 10 MG PO TABS   Oral   Take 10 mg by mouth at bedtime.         Carma Leaven M PLUS PO TABS   Oral   Take 1 tablet by mouth daily.         Marland Kitchen POTASSIUM CHLORIDE ER 10 MEQ PO TBCR   Oral   Take 10 mEq by mouth every other day.           SpO2 99%  Physical Exam  Constitutional: She is oriented to person, place, and time. She appears well-developed and well-nourished.  HENT:  Head: Normocephalic and atraumatic.  Eyes: Conjunctivae normal and EOM are normal. Pupils are equal, round, and reactive to light.  Neck: Normal range of motion.  Cardiovascular: Normal rate, regular rhythm and normal heart sounds.   Pulmonary/Chest: Effort normal and breath sounds normal.  Abdominal: Soft. Bowel sounds are normal.  Musculoskeletal:       Rt hip shortened and externally rotated  Neurological: She is alert and oriented to person, place, and time.  Skin: Skin is warm and dry.  Psychiatric: She has a  normal mood and affect. Her behavior is normal.    ED Course  Procedures (including critical care time)  Labs Reviewed - No data to display Dg Hip Complete Right  09/24/2012  *RADIOLOGY REPORT*  Clinical Data: Status post fall onto right hip; right hip pain.  RIGHT HIP - COMPLETE 2+ VIEW  Comparison: Right hip and pelvis radiographs performed 10/20/2011  Findings: There is superior dislocation of the patient's right hip prosthesis.  No fracture is seen.  There is no evidence of loosening of prosthesis components.  The mild apparent bony lucency about the socket appears to be grossly stable from the prior pelvic radiograph.  Lumbar spinal fusion hardware is noted; there is associated chronic fusion involving the lower lumbar spine.  Mild joint space narrowing is noted at the left hip joint.  The visualized bowel gas pattern is grossly unremarkable.  IMPRESSION:  1.  Superior dislocation of the right hip prosthesis; no evidence of fracture or loosening of prosthesis components. 2.  Mild apparent bony lucency about the socket appears to be grossly stable from the prior pelvic radiograph.   Original Report Authenticated By: Tonia Ghent, M.D.      No diagnosis found.  Procedural sedation Performed by: Rosanne Ashing Consent: Verbal consent obtained. Risks and benefits: risks, benefits and alternatives were discussed Required items: required blood products, implants, devices, and special equipment available Patient identity confirmed: arm band and provided demographic data Time out: Immediately prior to procedure a "time out" was called to verify the correct patient, procedure, equipment, support staff and site/side marked as required.  Sedation type: moderate (conscious) sedation NPO time confirmed and considedered  Sedatives: PROPOFOL  Physician Time at Bedside:  Vitals: Vital signs were monitored during sedation. Cardiac Monitor, pulse oximeter Patient tolerance: Patient  tolerated the procedure well with no immediate complications. Comments: Pt with uneventful recovered. Returned to pre-procedural sedation baseline    MDM  + hip dislocation,  Will call ortho, reassess  Hip reduced by ortho,  Will dc to fu oupt, ret new/worsening sxs     Brylee Mcgreal Lytle Michaels, MD 09/24/12 0148  Maejor Erven Lytle Michaels, MD 09/24/12 0430

## 2012-09-24 NOTE — ED Notes (Signed)
Pt assisted ventilations by BVM. Pt states dropped to 74% on 2L.

## 2012-09-24 NOTE — ED Notes (Signed)
Pt has multiple h/o similar, multiple h/o hip dislocations, reduced in ED in the past, pt of Dr. Yisroel Ramming (ortho). Daughter at [facility] (daughter is RN upstairs and is very familiar with pt & hx).

## 2012-09-24 NOTE — ED Notes (Signed)
Ortho MD and ortho tech at [facility], propofol pulled at [facility] for EDP CS, MD.

## 2012-09-24 NOTE — ED Notes (Signed)
Per ems pt was sitting on end of bed and fell landing on her bottom.  Pt right leg rotated inward and shorter than left.  No LOC or AMS 150 mg fentanyl given by ems

## 2012-09-24 NOTE — ED Notes (Signed)
Pt placed on bedpan. Pt given privacy.

## 2012-09-24 NOTE — ED Notes (Signed)
Two rn's and family present at discharge.  No questions about medications, discharge, or medical condition at discharge.

## 2012-09-24 NOTE — Consult Note (Signed)
Reason for Consult:Right hip dislocation Referring Physician: [name] is an 78 y.o. female that was putting on stockings earlier today and noted pain in right hip.  Unable to bear weight. Pain has been severe. Better since it began. Describes pain as an ache.  Past Medical History  Diagnosis Date  . COPD (chronic obstructive pulmonary disease)   . Hypertension     Past Surgical History  Procedure Date  . Hip surgery     (R) hip replacement    No family history on file.  Social History:  reports that she has never smoked. She does not have any smokeless tobacco history on file. She reports that she does not drink alcohol or use illicit drugs.  Allergies:  Allergies  Allergen Reactions  . Sulfa Antibiotics Shortness Of Breath    Worsens asthma    No results found for this or any previous visit (from the past 48 hour(s)).  Dg Hip Complete Right  09/24/2012  *RADIOLOGY REPORT*  Clinical Data: Status post fall onto right hip; right hip pain.  RIGHT HIP - COMPLETE 2+ VIEW  Comparison: Right hip and pelvis radiographs performed 10/20/2011  Findings: There is superior dislocation of the patient's right hip prosthesis.  No fracture is seen.  There is no evidence of loosening of prosthesis components.  The mild apparent bony lucency about the socket appears to be grossly stable from the prior pelvic radiograph.  Lumbar spinal fusion hardware is noted; there is associated chronic fusion involving the lower lumbar spine.  Mild joint space narrowing is noted at the left hip joint.  The visualized bowel gas pattern is grossly unremarkable.  IMPRESSION:  1.  Superior dislocation of the right hip prosthesis; no evidence of fracture or loosening of prosthesis components. 2.  Mild apparent bony lucency about the socket appears to be grossly stable from the prior pelvic radiograph.   Original Report Authenticated By: Tonia Ghent, M.D.     Review of Systems  All other systems  reviewed and are negative.   Blood pressure 128/46, pulse 117, resp. rate 18, SpO2 92.00%. Physical Exam  Right leg short and externally rotated + sensation + EHL, DF, PF Vitals stable Skin intact + DP pulse.   Assessment/Plan: right hip dislocation  Conscious sedation given and rip hip reduced uneventfully. Patient NVI upon repear exam. Knee immobilizer placed F/u AP of pelvis pending Patient will contact Dr. Riki Rusk for follow-up  [name] 09/24/2012, 3:37 AM

## 2012-11-03 ENCOUNTER — Other Ambulatory Visit: Payer: Self-pay | Admitting: Internal Medicine

## 2012-11-03 ENCOUNTER — Ambulatory Visit
Admission: RE | Admit: 2012-11-03 | Discharge: 2012-11-03 | Disposition: A | Discharge status: Home / Self Care | Payer: PRIVATE HEALTH INSURANCE | Source: Ambulatory Visit | Attending: Internal Medicine | Admitting: Internal Medicine

## 2012-11-03 DIAGNOSIS — J40 Bronchitis, not specified as acute or chronic: Secondary | ICD-10-CM

## 2012-12-15 ENCOUNTER — Ambulatory Visit (INDEPENDENT_AMBULATORY_CARE_PROVIDER_SITE_OTHER): Payer: PRIVATE HEALTH INSURANCE | Admitting: Internal Medicine

## 2012-12-15 ENCOUNTER — Encounter: Payer: Self-pay | Admitting: Internal Medicine

## 2012-12-15 VITALS — BP 170/72 | HR 75 | Temp 97.5°F | Resp 20 | Wt 105.8 lb

## 2012-12-15 DIAGNOSIS — R21 Rash and other nonspecific skin eruption: Secondary | ICD-10-CM

## 2012-12-15 DIAGNOSIS — I10 Essential (primary) hypertension: Secondary | ICD-10-CM

## 2012-12-15 DIAGNOSIS — F0280 Dementia in other diseases classified elsewhere without behavioral disturbance: Secondary | ICD-10-CM

## 2012-12-15 DIAGNOSIS — J45909 Unspecified asthma, uncomplicated: Secondary | ICD-10-CM

## 2012-12-15 DIAGNOSIS — G309 Alzheimer's disease, unspecified: Secondary | ICD-10-CM

## 2012-12-15 MED ORDER — FUROSEMIDE 20 MG PO TABS: ORAL_TABLET | ORAL | Status: DC

## 2012-12-15 MED ORDER — TRIAMCINOLONE ACETONIDE 0.5 % EX CREA: 1.0000 "application " | TOPICAL_CREAM | Freq: Two times a day (BID) | CUTANEOUS | Status: DC

## 2012-12-15 NOTE — Assessment & Plan Note (Addendum)
Memory about the same.  No needs for additional ADL help.  Here with daughter.  Dementia is stable.  No need to change therapy.  Appetite is decreased.  Does not feel like eating.  No dysphagia.

## 2012-12-15 NOTE — Assessment & Plan Note (Addendum)
Hypertension:   BP high at office today Normally has been satisfactory at home Repeat was 162/78 by me.    Had been falling with low blood pressure.  Will not make changes today.  Also follows with cardiology.

## 2012-12-15 NOTE — Assessment & Plan Note (Addendum)
Asthma:  Has been improving since her acute bronchitis.  No longer needing nebulizer treatments.  Feels much better.  Improved, no changes needed.

## 2012-12-15 NOTE — Progress Notes (Signed)
  Subjective:    Patient ID: [name], female    DOB: 1915-12-14, 79 y.o.   MRN: [id number]  HPI  79 yo female was seen today with her daughter re: her blood pressure, asthma, dementia.   Overall, she is much improved after she'd been seen 2 mos ago for an episode of bronchitis.  Review of Systems General:  Denies fatigue, denies weight loss Skin:  Denies rashes, denies concerning changes HEENT:  Denies headaches, denies visual changes, denies hearing loss, denies nasal congestion, denies sore throat, denies difficulty swallowing CV:  Denies chest pain, denies dyspnea on exertion, denies orthopnea, denies PND, denies edema Pulm:  Denies shortness of breath, denies wheezing, denies cough, has asthma, but has not required rescue inhaler or neb since bronchitis in jan GI:  Denies abdominal pain, denies constipation, denies diarrhea, denies melena, denies hematochezia, denies nausea, denies vomiting GU:  Denies urinary frequency, denies urinary urgency, denies dysuria, denies nocturia, denies urinary incontinence Neurologic:  Denies paresthesias, denies new focal weakness MSK:  Denies joint pain, denies back pain, denies muscle pain Hematology:  Denies bleeding, denies bruising Immunology:  Denies recurrent infections Psychiatry:  Denies changes in memory recently stable, denies depression, denies anxiety      Objective:   Physical Exam VS as in encounter General:  Frail, well-groomed pt in NAD Skin:  No visible lesions on inspection HEENT:  NCAT, PERRLA, EOMI, no nasal drainage, pharynx without erythema or exudate Neck:  No palpable thyromegaly or mass, trachea midline Lymphatics:  No palpable cervical or supraclavicular nodes CV:  RRR, no M/G/R, +S1/S2 Pulm:  Lungs CTA, no r/r/w Abd:  Soft, nondistended, nontender, no rebound or guarding, no palpable hepatosplenomegaly GU:  Bladder nontender, nondistended, no CVA tenderness MSK:  5/5 strength in all 4 extremities with normal  ROM,ambulates with a cane, stooped, kyphotic posture Neuro:  CNs II-XII grossly intact, no focal deficits, DTRs 2+, no tremors Psych:  AAOx2, affect normal        Assessment & Plan:  HYPERTENSION Hypertension:   BP high at office today Normally has been satisfactory at home Repeat was 162/78 by me.    Had been falling with low blood pressure.  Will not make changes today.  Also follows with cardiology.  ASTHMA, UNSPECIFIED, UNSPECIFIED STATUS Asthma:  Has been improving since her acute bronchitis.  No longer needing nebulizer treatments.  Feels much better.  Improved, no changes needed.  Alzheimer's disease Memory about the same.  No needs for additional ADL help.  Here with daughter.  Dementia is stable.  No need to change therapy.  Appetite is decreased.  Does not feel like eating.  No dysphagia.

## 2013-01-09 ENCOUNTER — Other Ambulatory Visit: Payer: Self-pay | Admitting: Neurosurgery

## 2013-01-09 ENCOUNTER — Ambulatory Visit
Admission: RE | Admit: 2013-01-09 | Discharge: 2013-01-09 | Disposition: A | Discharge status: Home / Self Care | Payer: PRIVATE HEALTH INSURANCE | Source: Ambulatory Visit | Attending: Neurosurgery | Admitting: Neurosurgery

## 2013-01-09 DIAGNOSIS — R51 Headache: Secondary | ICD-10-CM

## 2013-01-26 ENCOUNTER — Telehealth: Payer: Self-pay | Admitting: *Deleted

## 2013-01-26 MED ORDER — HYDROCODONE-ACETAMINOPHEN 5-325 MG PO TABS: ORAL_TABLET | ORAL | Status: DC

## 2013-01-26 NOTE — Telephone Encounter (Signed)
Those medications are nsaids not muscle relaxants.  I don't understand why the insurance company thinks they are comparable.

## 2013-01-26 NOTE — Telephone Encounter (Signed)
[name] called and stated that her mother insurance does not cover Skelaxin either but they will cover the following  1. Celebrex 2. Meloxicam 3. Diclosenac     [facility]

## 2013-01-27 NOTE — Telephone Encounter (Signed)
LMOVM to call back 

## 2013-01-30 ENCOUNTER — Telehealth: Payer: Self-pay | Admitting: Geriatric Medicine

## 2013-01-30 NOTE — Telephone Encounter (Signed)
I spoke with patient's daughter. I gave her the message from Dr. Renato Gails. She is calling [employer] and will call us back if there are any other problems.

## 2013-02-10 ENCOUNTER — Other Ambulatory Visit: Payer: Self-pay | Admitting: *Deleted

## 2013-02-10 MED ORDER — ALLOPURINOL 300 MG PO TABS: 300.0000 mg | ORAL_TABLET | Freq: Every day | ORAL | Status: DC

## 2013-02-13 ENCOUNTER — Other Ambulatory Visit: Payer: Self-pay | Admitting: *Deleted

## 2013-02-13 MED ORDER — ALLOPURINOL 300 MG PO TABS: 300.0000 mg | ORAL_TABLET | Freq: Every day | ORAL | Status: DC

## 2013-03-08 ENCOUNTER — Other Ambulatory Visit: Payer: Self-pay | Admitting: *Deleted

## 2013-03-08 MED ORDER — COLCHICINE 0.6 MG PO TABS: 0.6000 mg | ORAL_TABLET | Freq: Every day | ORAL | Status: DC | PRN

## 2013-03-22 ENCOUNTER — Other Ambulatory Visit: Payer: Self-pay | Admitting: *Deleted

## 2013-03-22 ENCOUNTER — Other Ambulatory Visit: Payer: PRIVATE HEALTH INSURANCE

## 2013-03-22 DIAGNOSIS — E559 Vitamin D deficiency, unspecified: Secondary | ICD-10-CM

## 2013-03-22 DIAGNOSIS — I10 Essential (primary) hypertension: Secondary | ICD-10-CM

## 2013-03-23 LAB — VITAMIN D 25 HYDROXY (VIT D DEFICIENCY, FRACTURES): Vit D, 25-Hydroxy: 56.8 ng/mL (ref 30.0–100.0)

## 2013-03-23 LAB — BASIC METABOLIC PANEL
BUN: 34 mg/dL (ref 10–36)
CO2: 24 mmol/L (ref 18–29)
Calcium: 8.8 mg/dL (ref 8.6–10.2)
Creatinine, Ser: 1.13 mg/dL — ABNORMAL HIGH (ref 0.57–1.00)
GFR calc non Af Amer: 41 mL/min/{1.73_m2} — ABNORMAL LOW (ref >59)
Glucose: 87 mg/dL (ref 65–99)
Sodium: 141 mmol/L (ref 134–144)

## 2013-03-24 ENCOUNTER — Encounter: Payer: Self-pay | Admitting: Geriatric Medicine

## 2013-03-24 ENCOUNTER — Ambulatory Visit (INDEPENDENT_AMBULATORY_CARE_PROVIDER_SITE_OTHER): Payer: PRIVATE HEALTH INSURANCE | Admitting: Internal Medicine

## 2013-03-24 ENCOUNTER — Encounter: Payer: Self-pay | Admitting: Internal Medicine

## 2013-03-24 VITALS — BP 110/62 | HR 52 | Temp 97.3°F | Resp 14 | Ht <= 58 in | Wt 101.0 lb

## 2013-03-24 DIAGNOSIS — M810 Age-related osteoporosis without current pathological fracture: Secondary | ICD-10-CM

## 2013-03-24 DIAGNOSIS — E559 Vitamin D deficiency, unspecified: Secondary | ICD-10-CM

## 2013-03-24 DIAGNOSIS — R5383 Other fatigue: Secondary | ICD-10-CM

## 2013-03-24 DIAGNOSIS — R5381 Other malaise: Secondary | ICD-10-CM

## 2013-03-24 DIAGNOSIS — M48062 Spinal stenosis, lumbar region with neurogenic claudication: Secondary | ICD-10-CM | POA: Insufficient documentation

## 2013-03-24 DIAGNOSIS — M4800 Spinal stenosis, site unspecified: Secondary | ICD-10-CM

## 2013-03-24 DIAGNOSIS — J45909 Unspecified asthma, uncomplicated: Secondary | ICD-10-CM

## 2013-03-24 DIAGNOSIS — S43006A Unspecified dislocation of unspecified shoulder joint, initial encounter: Secondary | ICD-10-CM

## 2013-03-24 DIAGNOSIS — F0280 Dementia in other diseases classified elsewhere without behavioral disturbance: Secondary | ICD-10-CM

## 2013-03-24 DIAGNOSIS — N390 Urinary tract infection, site not specified: Secondary | ICD-10-CM

## 2013-03-24 DIAGNOSIS — I10 Essential (primary) hypertension: Secondary | ICD-10-CM

## 2013-03-24 MED ORDER — LISINOPRIL 40 MG PO TABS: 40.0000 mg | ORAL_TABLET | Freq: Every day | ORAL | Status: DC

## 2013-03-24 MED ORDER — AMLODIPINE BESYLATE 10 MG PO TABS: 10.0000 mg | ORAL_TABLET | Freq: Every day | ORAL | Status: DC

## 2013-03-24 NOTE — Progress Notes (Signed)
Patient ID: [name], female   DOB: Feb 08, 1916, 79 y.o.   MRN: [id number] Location:  [facility] [place] [facility]  Code Status: Has living will and health care poa--her daughter his hcpoa;  DNR code status reviewed today and she clearly does not want rescuscitation at her age    Allergies  Allergen Reactions  . Sulfa Antibiotics Shortness Of Breath    Worsens asthma    Chief Complaint  Patient presents with  . Medical Managment of Chronic Issues    possible uti    HPI: Patient is a 79 y.o. white female seen in the office today for routine med mgt of chronic issues and c/o possible UTI.   Doesn't feel so good today.  Urinalysis dip has been negative.  Had been having frequent urination.  Did not sleep well last couple of nights.  Was sleepy in daytimes.  Not drinking enough fluids.  Feels very thirsty.  So far, one cup coffee and glass of water and two donut holes.   Aches between her back and her arm all of the time.  Pain is making her sick on the stomach, she says.  Not eating well past few months.  Does pretty well with her dinner.  Has cleaned her plate past few nights.  Denies depression, but daughter nods her head that she is.  Now skelaxin not covered anymore for her pain.  Now on tizanidine from orthopedics.  Has just begun taking it--using bid.      Review of Systems:  Review of Systems  Constitutional: Positive for weight loss and malaise/fatigue. Negative for fever, chills and diaphoresis.  Respiratory: Negative for shortness of breath.   Cardiovascular: Negative for chest pain.  Gastrointestinal: Negative for heartburn and constipation.  Genitourinary: Positive for urgency and frequency.  Musculoskeletal: Positive for back pain and joint pain. Negative for falls.  Neurological: Positive for dizziness and weakness.  Endo/Heme/Allergies: Bruises/bleeds easily.  Psychiatric/Behavioral: Positive for memory loss. Negative for depression. The  patient is not nervous/anxious.        Denies depression, but daughter feels she is     Past Medical History  Diagnosis Date  . COPD (chronic obstructive pulmonary disease)   . Hypertension   . Oral aphthae   . Orthostatic hypotension   . Senile osteoporosis   . Atrophy of vulva   . Unspecified urinary incontinence   . Urinary frequency   . Unspecified vitamin D deficiency   . Anxiety state, unspecified   . Restless legs syndrome (RLS)   . Unspecified disorder of kidney and ureter   . Atrioventricular block, unspecified   . Closed dislocation of shoulder, unspecified site   . Anemia, unspecified   . Altered mental status   . Lumbago   . Transient disorder of initiating or maintaining sleep   . Cellulitis and abscess of unspecified site   . Sebaceous cyst   . Insomnia, unspecified   . Dementia in conditions classified elsewhere without behavioral disturbance(294.10)   . Depressive disorder, not elsewhere classified   . Abnormality of gait   . Adult failure to thrive   . Headache(784.0)   . Type II or unspecified type diabetes mellitus without mention of complication, not stated as uncontrolled   . Gout, unspecified   . Extrinsic asthma, unspecified   . Chronic kidney disease, stage I   . Osteoarthrosis, unspecified whether generalized or localized, unspecified site   . Spinal stenosis, unspecified region other than cervical   .  Edema   . Tachycardia, unspecified     Past Surgical History  Procedure Laterality Date  . Hip surgery      (R) hip replacement  . Cesarean section    . Abdominal hysterectomy      partial  . Back surgery      fusion  . Breast surgery      bilateral breast reduction  . Eye surgery      bilateral cataract  . Basal cell carcinoma excision    . Basal cell carcinoma excision      face    Social History:   reports that she has never smoked. She does not have any smokeless tobacco history on file. She reports that she does not drink  alcohol or use illicit drugs.  Family History  Problem Relation Age of Onset  . Diabetes Mother   . Diabetes Father   . Stroke Sister   . Heart attack Brother     Medications: Patient's Medications  New Prescriptions   No medications on file  Previous Medications   ALBUTEROL-IPRATROPIUM (COMBIVENT) 18-103 MCG/ACT INHALER    Inhale 2 puffs into the lungs every 4 (four) hours as needed. For wheezing or shortness   ALENDRONATE (FOSAMAX) 70 MG TABLET       ALLOPURINOL (ZYLOPRIM) 300 MG TABLET    Take 1 tablet (300 mg total) by mouth daily.   CLONAZEPAM (KLONOPIN) 1 MG TABLET    Take 1 mg by mouth at bedtime.    COLCHICINE (COLCRYS) 0.6 MG TABLET    Take 1 tablet (0.6 mg total) by mouth daily as needed. For gout pain flare ups   DONEPEZIL (ARICEPT) 10 MG TABLET    Take 10 mg by mouth at bedtime.    ERGOCALCIFEROL (VITAMIN D2) 50000 UNITS CAPSULE    Take 50,000 Units by mouth once a week.   FLUTICASONE-SALMETEROL (ADVAIR) 250-50 MCG/DOSE AEPB    Inhale 1 puff into the lungs every 12 (twelve) hours.   FUROSEMIDE (LASIX) 20 MG TABLET    Take one tablet every other day for fluid retention.   HYDROCODONE-ACETAMINOPHEN (VICODIN) 5-500 MG PER TABLET    Take 1 tablet by mouth 2 (two) times daily.   MIRTAZAPINE (REMERON) 15 MG TABLET    Take 15 mg by mouth at bedtime.   MONTELUKAST (SINGULAIR) 10 MG TABLET    Take 10 mg by mouth at bedtime.   MULTIPLE VITAMINS-MINERALS (MULTIVITAMINS THER. W/MINERALS) TABS    Take 1 tablet by mouth daily.   POTASSIUM CHLORIDE (K-DUR) 10 MEQ TABLET    Take 10 mEq by mouth every other day.   PROAIR HFA 108 (90 BASE) MCG/ACT INHALER       TIZANIDINE (ZANAFLEX) 2 MG TABLET    Take 2 mg by mouth 2 (two) times daily.   TRIAMCINOLONE CREAM (KENALOG) 0.5 %    Apply 1 application topically 2 (two) times daily. To rash on legs  Modified Medications   Modified Medication Previous Medication   AMLODIPINE (NORVASC) 10 MG TABLET amLODipine (NORVASC) 10 MG tablet      Take 1  tablet (10 mg total) by mouth daily.    Take 10 mg by mouth daily.   LISINOPRIL (PRINIVIL,ZESTRIL) 40 MG TABLET lisinopril (PRINIVIL,ZESTRIL) 40 MG tablet      Take 1 tablet (40 mg total) by mouth daily.    Take 40 mg by mouth daily.  Discontinued Medications   HYDROCODONE-ACETAMINOPHEN (NORCO/VICODIN) 5-325 MG PER TABLET    Take 1 tablet every  6 hours as needed for pain   METAXALONE (SKELAXIN) 800 MG TABLET    Take 800 mg by mouth 2 (two) times daily. Take one tablet by mouth twice daily for muscle aches.     Physical Exam: Filed Vitals:   03/24/13 1012  BP: 110/62  Pulse: 52  Temp: 97.3 F (36.3 C)  TempSrc: Oral  Resp: 14  Height: 4\' 7"  (1.397 m)  Weight: 101 lb (45.813 kg)  SpO2: 95%  Physical Exam  Constitutional:  Frail caucasian female, nad  HENT:  Head: Normocephalic and atraumatic.  Cardiovascular:  Bradycardic today, has first degree heart block  Pulmonary/Chest: Effort normal and breath sounds normal.  Abdominal: Soft. Bowel sounds are normal.  Musculoskeletal: She exhibits no edema.  Right upper arm and shoulder tender, low back/hips tender  Neurological: She is alert. No cranial nerve deficit.  Oriented to person and place, not exact time  Skin: Skin is warm and dry.  Psychiatric:  Affect a bit more flat today    Labs reviewed: Basic Metabolic Panel:  Recent Labs  40/98/11 1100  NA 141  K 4.3  CL 102  CO2 24  GLUCOSE 87  BUN 34  CREATININE 1.13*  CALCIUM 8.8   Assessment/Plan Other malaise and fatigue Several contributors:  Bradycardic today, and discussed possible ekg for further evaluation (has been in first degree av block), but she and her daughter did not want this (what would we do about it at this point?).  Also, new muscle relaxant tizanidine may make her drowsier than skelaxin did--may change to just at night if a difference is noted.  Progression of dementia and failure to thrive--has poor po intake, has appeared depressed--has been on  remeron 15mg  which was helping to keep her weight stable.    Spinal stenosis, lumbar region, with neurogenic claudication Chronic.  On hydrocodone bid with mild benefit.  She cannot tolerate more narcotics.    Closed dislocation of shoulder, unspecified site On right in past.  Has chronic pain.  On hydrocodone bid and tizanidine muscle relaxant.  HYPERTENSION Stable.  Goal <150/90 with her frailty and fracture risk with falls.  ASTHMA, UNSPECIFIED, UNSPECIFIED STATUS Stable with advair, singulair routinely, combivent as needed  Alzheimer's disease Gradually progressive.  Failure to thrive is worsening lately.  Does not appear to have UTI per dipstick. Reviewed goals of care and today asked her daughter to bring copies of her living will and hcpoa for our records.  Pt requests DNR code status.  Senile osteoporosis Is on fosamax and has one more month's worse of weekly vitamin D, then will switch to 2000 units daily to maintain levels and help to reduce fracture risk.  Unspecified vitamin D deficiency As above in senile osteoporosis.  Change to daily vitamin D now that levels are improved.     Labs/tests ordered: urine dipstick was done Next appt:  4 mos

## 2013-03-24 NOTE — Patient Instructions (Signed)
Try daily vitamin D3 2000 units in place of weekly supplement when you finish those pills.

## 2013-03-25 DIAGNOSIS — R5383 Other fatigue: Secondary | ICD-10-CM | POA: Insufficient documentation

## 2013-03-25 DIAGNOSIS — E559 Vitamin D deficiency, unspecified: Secondary | ICD-10-CM | POA: Insufficient documentation

## 2013-03-25 DIAGNOSIS — R5381 Other malaise: Secondary | ICD-10-CM | POA: Insufficient documentation

## 2013-03-25 DIAGNOSIS — M810 Age-related osteoporosis without current pathological fracture: Secondary | ICD-10-CM | POA: Insufficient documentation

## 2013-03-25 NOTE — Assessment & Plan Note (Addendum)
Several contributors:  Bradycardic today, and discussed possible ekg for further evaluation (has been in first degree av block), but she and her daughter did not want this (what would we do about it at this point?).  Also, new muscle relaxant tizanidine may make her drowsier than skelaxin did--may change to just at night if a difference is noted.  Progression of dementia and failure to thrive--has poor po intake, has appeared depressed--has been on remeron 15mg  which was helping to keep her weight stable.

## 2013-03-25 NOTE — Assessment & Plan Note (Signed)
Stable.  Goal <150/90 with her frailty and fracture risk with falls.

## 2013-03-25 NOTE — Assessment & Plan Note (Signed)
Gradually progressive.  Failure to thrive is worsening lately.  Does not appear to have UTI per dipstick. Reviewed goals of care and today asked her daughter to bring copies of her living will and hcpoa for our records.  Pt requests DNR code status.

## 2013-03-25 NOTE — Assessment & Plan Note (Signed)
As above in senile osteoporosis.  Change to daily vitamin D now that levels are improved.

## 2013-03-25 NOTE — Assessment & Plan Note (Signed)
Chronic.  On hydrocodone bid with mild benefit.  She cannot tolerate more narcotics.

## 2013-03-25 NOTE — Assessment & Plan Note (Signed)
Stable with advair, singulair routinely, combivent as needed

## 2013-03-25 NOTE — Assessment & Plan Note (Signed)
On right in past.  Has chronic pain.  On hydrocodone bid and tizanidine muscle relaxant.

## 2013-03-25 NOTE — Assessment & Plan Note (Signed)
Is on fosamax and has one more month's worse of weekly vitamin D, then will switch to 2000 units daily to maintain levels and help to reduce fracture risk.

## 2013-03-28 ENCOUNTER — Other Ambulatory Visit: Payer: Self-pay | Admitting: *Deleted

## 2013-03-28 MED ORDER — HYDROCODONE-ACETAMINOPHEN 5-325 MG PO TABS: ORAL_TABLET | ORAL | Status: DC

## 2013-04-05 ENCOUNTER — Other Ambulatory Visit: Payer: Self-pay | Admitting: Geriatric Medicine

## 2013-04-05 MED ORDER — MONTELUKAST SODIUM 10 MG PO TABS: 10.0000 mg | ORAL_TABLET | Freq: Every day | ORAL | Status: DC

## 2013-04-05 MED ORDER — MIRTAZAPINE 15 MG PO TABS: 15.0000 mg | ORAL_TABLET | Freq: Every day | ORAL | Status: DC

## 2013-04-05 MED ORDER — ALENDRONATE SODIUM 70 MG PO TABS: 70.0000 mg | ORAL_TABLET | ORAL | Status: DC

## 2013-04-05 MED ORDER — ALLOPURINOL 300 MG PO TABS: 300.0000 mg | ORAL_TABLET | Freq: Every day | ORAL | Status: DC

## 2013-04-05 MED ORDER — FUROSEMIDE 20 MG PO TABS: ORAL_TABLET | ORAL | Status: DC

## 2013-04-05 MED ORDER — DONEPEZIL HCL 10 MG PO TABS: 10.0000 mg | ORAL_TABLET | Freq: Every day | ORAL | Status: DC

## 2013-06-03 ENCOUNTER — Other Ambulatory Visit: Payer: Self-pay | Admitting: Internal Medicine

## 2013-06-03 DIAGNOSIS — F0280 Dementia in other diseases classified elsewhere without behavioral disturbance: Secondary | ICD-10-CM

## 2013-06-03 DIAGNOSIS — R627 Adult failure to thrive: Secondary | ICD-10-CM

## 2013-06-03 DIAGNOSIS — M4800 Spinal stenosis, site unspecified: Secondary | ICD-10-CM

## 2013-06-03 MED ORDER — MORPHINE SULFATE (CONCENTRATE) 20 MG/ML PO SOLN: 5.0000 mg | ORAL | Status: DC | PRN

## 2013-06-03 NOTE — Progress Notes (Signed)
I received a call from [place] See[place]'s daughter, [name], today while on call.  She is concerned that her mother has declined significantly since she was last seen in the office, and, especially over the past week.  She has Alzheimer's disease with failure to thrive, and chronic pain from spinal stenosis.  Many of the family members have been in town this weekend and they have had the opportunity to discuss her condition and have decided that they would like a hospice referral to [facility].  She is having more pain, rarely ambulating, more difficulty swallowing and her po intake is declining.  She is spending much of her time in bed or the recliner chair.  It is hard for her to reposition herself due to weakness.  [name] also asked for some liquid pain medication b/c she does not think Fae will be able to swallow pills for much longer.    I have placed the referral and ordered the pain medication, but the hard script will need to be printed and brought to the pharmacy Monday unless she is admitted to hospice over the weekend.

## 2013-06-05 ENCOUNTER — Other Ambulatory Visit: Payer: Self-pay | Admitting: *Deleted

## 2013-06-06 ENCOUNTER — Other Ambulatory Visit: Payer: Self-pay

## 2013-06-06 DIAGNOSIS — F329 Major depressive disorder, single episode, unspecified: Secondary | ICD-10-CM | POA: Diagnosis not present

## 2013-06-06 MED ORDER — MORPHINE SULFATE (CONCENTRATE) 20 MG/ML PO SOLN: 5.0000 mg | ORAL | Status: DC | PRN

## 2013-06-06 NOTE — Telephone Encounter (Signed)
RX manually faxed to pharmacy

## 2013-06-07 DIAGNOSIS — F329 Major depressive disorder, single episode, unspecified: Secondary | ICD-10-CM | POA: Diagnosis not present

## 2013-06-12 DIAGNOSIS — F329 Major depressive disorder, single episode, unspecified: Secondary | ICD-10-CM | POA: Diagnosis not present

## 2013-06-19 DIAGNOSIS — F329 Major depressive disorder, single episode, unspecified: Secondary | ICD-10-CM | POA: Diagnosis not present

## 2013-06-23 DIAGNOSIS — F329 Major depressive disorder, single episode, unspecified: Secondary | ICD-10-CM | POA: Diagnosis not present

## 2013-06-26 ENCOUNTER — Ambulatory Visit: Payer: PRIVATE HEALTH INSURANCE | Admitting: Internal Medicine

## 2013-06-26 DIAGNOSIS — F329 Major depressive disorder, single episode, unspecified: Secondary | ICD-10-CM | POA: Diagnosis not present

## 2013-06-28 DIAGNOSIS — F329 Major depressive disorder, single episode, unspecified: Secondary | ICD-10-CM | POA: Diagnosis not present

## 2013-07-03 ENCOUNTER — Other Ambulatory Visit: Payer: Self-pay | Admitting: *Deleted

## 2013-07-03 MED ORDER — HYDROCODONE-ACETAMINOPHEN 5-325 MG PO TABS: ORAL_TABLET | ORAL | Status: DC

## 2013-07-10 ENCOUNTER — Encounter: Payer: Self-pay | Admitting: Internal Medicine

## 2013-07-10 ENCOUNTER — Ambulatory Visit (INDEPENDENT_AMBULATORY_CARE_PROVIDER_SITE_OTHER): Payer: Medicare Other | Admitting: Internal Medicine

## 2013-07-10 VITALS — BP 108/66 | HR 42 | Temp 96.8°F | Wt 86.0 lb

## 2013-07-10 DIAGNOSIS — M810 Age-related osteoporosis without current pathological fracture: Secondary | ICD-10-CM

## 2013-07-10 DIAGNOSIS — M4800 Spinal stenosis, site unspecified: Secondary | ICD-10-CM | POA: Diagnosis not present

## 2013-07-10 DIAGNOSIS — K5900 Constipation, unspecified: Secondary | ICD-10-CM

## 2013-07-10 DIAGNOSIS — R627 Adult failure to thrive: Secondary | ICD-10-CM | POA: Diagnosis not present

## 2013-07-10 DIAGNOSIS — J45909 Unspecified asthma, uncomplicated: Secondary | ICD-10-CM

## 2013-07-10 DIAGNOSIS — F0280 Dementia in other diseases classified elsewhere without behavioral disturbance: Secondary | ICD-10-CM

## 2013-07-10 MED ORDER — SENNOSIDES-DOCUSATE SODIUM 8.6-50 MG PO TABS: 2.0000 | ORAL_TABLET | Freq: Every day | ORAL | Status: DC

## 2013-07-10 NOTE — Progress Notes (Signed)
Patient ID: [name], female   DOB: Aug 21, 1916, 79 y.o.   MRN: [id number] Location:  [facility] / [place] [facility]  Code Status: DNR, on hospice care with [facility], dtr, [name] is HCPOA   Allergies  Allergen Reactions  . Sulfa Antibiotics Shortness Of Breath    Worsens asthma    Chief Complaint  Patient presents with  . Advice Only    Here with daughter to discuss facility placement     HPI: Patient is a 79 y.o. white female with mild Alzheimer's (recently worse), severe depression with failure to thrive, asthma, lumbar spinal stenosis with neurogenic claudication, senile osteoporosis, vitamin D deficiency, orthostatic hypotension, restless legs syndrome, DMII on diet and prior gout and hip dislocation seen in the office today accompanied by her daughter.  We had last spoken on the phone when she was abruptly declining.  At that time it was noted that she was having more pain, rarely ambulating, more difficulty swallowing and her po intake was declining. She was spending much of her time in bed or the recliner chair. It was hard for her to reposition herself due to weakness. Robin asked for some liquid pain medication b/c she does not think Amera will be able to swallow pills for much longer.   When seen today, pt's concern was that she cannot walk.  This has her down.  Is wide awake in visit today.  Sleeping ok.  Not eating much.  Did eat well yesterday per her daughter--egg, two donuts, coffee at breakfast;  pb chocolate bar at lunch;  1/2 grilled cheese sandwich and tomato soup at dinner.  Hasn't eaten that well in a long time.   Is much more confused than when I have previously seen her which may be from medication changes to keep her comfortable.  Had coffee and two small donuts this am, but did not remember this.  Did have bowel issues the past week.  Took milk of mag two days w/o bm.  Then given dulcolax tablet and had two explosive episodes.    05/30/13 event  had fever that broke a day later.  Has just not recuperated from that.  Could not walk, stand since then.  Has had difficulty talking since then--I had noted that she seemed dysarthric which I had never really observed before.  Her daughter had an FL-2 filled out through hospice to do rehab at [place].  Went to [place] to check it out.  20 days of rehab will be covered.  Medicaid papers also completed.  Can have palliative care while there.  Pt wants to walk so wants to go to therapy.  Her daughter can't take care of her at home any longer--she is a Scientist, clinical (histocompatibility and immunogenetics) and has been taking off a lot of work and running out of time on her FMLA.  Is also clearly very stressed and worn out.      Review of Systems:  Review of Systems  Constitutional: Positive for weight loss and malaise/fatigue. Negative for fever and chills.  HENT: Negative for congestion.   Eyes: Negative for pain.       Wears glasses  Respiratory: Negative for shortness of breath.   Cardiovascular: Negative for chest pain and leg swelling.  Gastrointestinal: Positive for constipation. Negative for nausea, vomiting, abdominal pain, diarrhea, blood in stool and melena.  Genitourinary:       Urinary incontinence, but is able to make it to bathroom with her daughter's assistance for bowel movements  Musculoskeletal: Positive for back pain, joint pain and myalgias. Negative for falls.  Skin: Negative for rash.  Neurological: Positive for weakness. Negative for dizziness, focal weakness, loss of consciousness and headaches.  Endo/Heme/Allergies: Bruises/bleeds easily.  Psychiatric/Behavioral: Positive for depression and memory loss. The patient does not have insomnia.     Past Medical History  Diagnosis Date  . COPD (chronic obstructive pulmonary disease)   . Hypertension   . Oral aphthae   . Orthostatic hypotension   . Senile osteoporosis   . Atrophy of vulva   . Unspecified urinary incontinence   . Urinary frequency   . Unspecified  vitamin D deficiency   . Anxiety state, unspecified   . Restless legs syndrome (RLS)   . Unspecified disorder of kidney and ureter   . Atrioventricular block, unspecified   . Closed dislocation of shoulder, unspecified site   . Anemia, unspecified   . Altered mental status   . Lumbago   . Transient disorder of initiating or maintaining sleep   . Cellulitis and abscess of unspecified site   . Sebaceous cyst   . Insomnia, unspecified   . Dementia in conditions classified elsewhere without behavioral disturbance(294.10)   . Depressive disorder, not elsewhere classified   . Abnormality of gait   . Adult failure to thrive   . Headache(784.0)   . Type II or unspecified type diabetes mellitus without mention of complication, not stated as uncontrolled   . Gout, unspecified   . Extrinsic asthma, unspecified   . Chronic kidney disease, stage I   . Osteoarthrosis, unspecified whether generalized or localized, unspecified site   . Spinal stenosis, unspecified region other than cervical   . Edema   . Tachycardia, unspecified     Past Surgical History  Procedure Laterality Date  . Hip surgery      (R) hip replacement  . Cesarean section    . Abdominal hysterectomy      partial  . Back surgery      fusion  . Breast surgery      bilateral breast reduction  . Eye surgery      bilateral cataract  . Basal cell carcinoma excision    . Basal cell carcinoma excision      face    Social History:   reports that she has never smoked. She does not have any smokeless tobacco history on file. She reports that she does not drink alcohol or use illicit drugs.  Family History  Problem Relation Age of Onset  . Diabetes Mother   . Diabetes Father   . Stroke Sister   . Heart attack Brother     Medications: Patient's Medications  New Prescriptions   No medications on file  Previous Medications   CLONAZEPAM (KLONOPIN) 1 MG TABLET    Take 1 mg by mouth at bedtime.     HYDROCODONE-ACETAMINOPHEN (NORCO/VICODIN) 5-325 MG PER TABLET    Take 1 tablet by mouth every 6 (six) hours as needed for pain.   MORPHINE (ROXANOL) 20 MG/ML CONCENTRATED SOLUTION    Take 0.25 mLs (5 mg total) by mouth every 4 (four) hours as needed for pain (or shortness of breath). Hospice Patient   TIZANIDINE (ZANAFLEX) 2 MG TABLET    Take 2 mg by mouth 2 (two) times daily.   TRIAMCINOLONE CREAM (KENALOG) 0.5 %    Apply 1 application topically 2 (two) times daily. To rash on legs  Modified Medications   No medications on file  Discontinued Medications  ALBUTEROL-IPRATROPIUM (COMBIVENT) 18-103 MCG/ACT INHALER    Inhale 2 puffs into the lungs every 4 (four) hours as needed. For wheezing or shortness   ALENDRONATE (FOSAMAX) 70 MG TABLET    Take 1 tablet (70 mg total) by mouth every 7 (seven) days.   ALLOPURINOL (ZYLOPRIM) 300 MG TABLET    Take 1 tablet (300 mg total) by mouth daily.   AMLODIPINE (NORVASC) 10 MG TABLET    Take 1 tablet (10 mg total) by mouth daily.   COLCHICINE (COLCRYS) 0.6 MG TABLET    Take 1 tablet (0.6 mg total) by mouth daily as needed. For gout pain flare ups   DONEPEZIL (ARICEPT) 10 MG TABLET    Take 1 tablet (10 mg total) by mouth at bedtime.   ERGOCALCIFEROL (VITAMIN D2) 50000 UNITS CAPSULE    Take 50,000 Units by mouth once a week.   FLUTICASONE-SALMETEROL (ADVAIR) 250-50 MCG/DOSE AEPB    Inhale 1 puff into the lungs every 12 (twelve) hours.   FUROSEMIDE (LASIX) 20 MG TABLET    Take one tablet every other day for fluid retention.   HYDROCODONE-ACETAMINOPHEN (NORCO/VICODIN) 5-325 MG PER TABLET    Take 1 tablet every 6 hours  As needed for pain   HYDROCODONE-ACETAMINOPHEN (VICODIN) 5-500 MG PER TABLET    Take 1 tablet by mouth 2 (two) times daily.   LISINOPRIL (PRINIVIL,ZESTRIL) 40 MG TABLET    Take 1 tablet (40 mg total) by mouth daily.   MIRTAZAPINE (REMERON) 15 MG TABLET    Take 1 tablet (15 mg total) by mouth at bedtime.   MONTELUKAST (SINGULAIR) 10 MG TABLET    Take  1 tablet (10 mg total) by mouth at bedtime.   MULTIPLE VITAMINS-MINERALS (MULTIVITAMINS THER. W/MINERALS) TABS    Take 1 tablet by mouth daily.   POTASSIUM CHLORIDE (K-DUR) 10 MEQ TABLET    Take 10 mEq by mouth every other day.   PROAIR HFA 108 (90 BASE) MCG/ACT INHALER         Physical Exam: Filed Vitals:   07/10/13 1144  BP: 108/66  Pulse: 42  Temp: 96.8 F (36 C)  TempSrc: Oral  Weight: 86 lb (39.009 kg)  SpO2: 93%  Physical Exam  Constitutional:  Very frail white female seated in lightweight wheelchair, nad  HENT:  Head: Normocephalic and atraumatic.  Eyes: EOM are normal. Pupils are equal, round, and reactive to light.  Wearing glasses  Cardiovascular: Normal rate, regular rhythm, normal heart sounds and intact distal pulses.   Pulmonary/Chest: Effort normal and breath sounds normal. No respiratory distress.  Abdominal: Soft. Bowel sounds are normal. She exhibits no distension. There is no tenderness.  Musculoskeletal: She exhibits tenderness. She exhibits no edema.  Tenderness of low back, hips, shoulders;  kyphotic  Neurological: She is alert.  Oriented to person, confused about place and time;  Seems newly dysarthric to me, but workup will not influence long term plans (discussed with her daughter when initial decline occurred)  Skin: Skin is warm and dry.     Labs reviewed: Basic Metabolic Panel:  Recent Labs  16/10/96 1100  NA 141  K 4.3  CL 102  CO2 24  GLUCOSE 87  BUN 34  CREATININE 1.13*  CALCIUM 8.8   Assessment/Plan 1. Alzheimer's disease -moderate--had been mild until this recent suspected stroke -is off of her dementia medications and has been on hospice for her depression which seems slightly better during today's visit though she remains tearful about being unable to walk -requires adl help  except feeding, is HOH and has poor vision, is incontinent of urine  2. Failure to thrive in adult -suspect a stroke occurred about a 5-6 wks ago causing  her dysarthria, dysphagia and slumping to the side she was having--this has improved some now and her daughter requests a rehab stay at [facility] -previously was on remeron for appetite which did help some -her hospice diagnosis had been depression b/c the pt was too weak to be seen in the office for me to properly diagnose the cause of her decline a month ago--seems she did have a stroke though no imaging has been done due to her frailty  3. Spinal stenosis, unspecified region other than cervical -difficult to control lumbar spinal stenosis with neurogenic claudication--pt easily becomes somnolent and very confused with pain meds so she has really only been getting the hydrocodone at home, but has been fairly inactive -since her daughter wants to try some therapy for her, she may need the roxanol to control her pain -if she does well with therapy, she may be able to return home, but I am not certain this will occur  4. Unspecified asthma(493.90) -has recently been well controlled without any medications--lungs clear -prn nebs may be added at [facility] if needed  5. Senile osteoporosis -was previously on vitamin D which has been stopped due to difficulty swallowing pills--I suspect she had a stroke that has led to her decline  6. Constipation -due to pain medications - start senna-docusate (SENOKOT-S) 8.6-50 MG per tablet; Take 2 tablets by mouth daily.  Dispense: 60 tablet; Refill: 3 -hold this for loose stools  Labs/tests ordered:  FL-2 was completed for STR at [facility] and likely long term care--also, [name] would like her followed by [facility] palliative care during rehab Next appt:  Not ordered due to plan for snf placement where she will be followed by my colleagues

## 2013-07-17 ENCOUNTER — Ambulatory Visit: Payer: PRIVATE HEALTH INSURANCE | Admitting: Internal Medicine

## 2013-07-29 DIAGNOSIS — F329 Major depressive disorder, single episode, unspecified: Secondary | ICD-10-CM | POA: Diagnosis not present

## 2013-07-31 DIAGNOSIS — F329 Major depressive disorder, single episode, unspecified: Secondary | ICD-10-CM | POA: Diagnosis not present

## 2013-08-02 DIAGNOSIS — E559 Vitamin D deficiency, unspecified: Secondary | ICD-10-CM | POA: Diagnosis not present

## 2013-08-02 DIAGNOSIS — R269 Unspecified abnormalities of gait and mobility: Secondary | ICD-10-CM | POA: Diagnosis not present

## 2013-08-02 DIAGNOSIS — W19XXXA Unspecified fall, initial encounter: Secondary | ICD-10-CM | POA: Diagnosis not present

## 2013-08-02 DIAGNOSIS — I10 Essential (primary) hypertension: Secondary | ICD-10-CM | POA: Diagnosis not present

## 2013-08-02 DIAGNOSIS — M109 Gout, unspecified: Secondary | ICD-10-CM | POA: Diagnosis not present

## 2013-08-02 DIAGNOSIS — F329 Major depressive disorder, single episode, unspecified: Secondary | ICD-10-CM | POA: Diagnosis not present

## 2013-08-02 DIAGNOSIS — F411 Generalized anxiety disorder: Secondary | ICD-10-CM | POA: Diagnosis not present

## 2013-08-02 DIAGNOSIS — F0280 Dementia in other diseases classified elsewhere without behavioral disturbance: Secondary | ICD-10-CM | POA: Diagnosis not present

## 2013-08-02 DIAGNOSIS — D649 Anemia, unspecified: Secondary | ICD-10-CM | POA: Diagnosis not present

## 2013-08-03 DIAGNOSIS — F329 Major depressive disorder, single episode, unspecified: Secondary | ICD-10-CM | POA: Diagnosis not present

## 2013-08-05 DIAGNOSIS — F329 Major depressive disorder, single episode, unspecified: Secondary | ICD-10-CM | POA: Diagnosis not present

## 2013-08-07 ENCOUNTER — Emergency Department (HOSPITAL_COMMUNITY): Payer: Medicare Other

## 2013-08-07 ENCOUNTER — Encounter (HOSPITAL_COMMUNITY): Payer: Self-pay | Admitting: Emergency Medicine

## 2013-08-07 ENCOUNTER — Encounter (HOSPITAL_COMMUNITY)
Admission: EM | Disposition: A | Discharge status: Skilled Nursing Facility | Payer: Self-pay | Source: Home / Self Care | Attending: Orthopaedic Surgery

## 2013-08-07 ENCOUNTER — Inpatient Hospital Stay (HOSPITAL_COMMUNITY)
Admission: EM | Admit: 2013-08-07 | Discharge: 2013-08-15 | DRG: 466 | Disposition: A | Discharge status: Skilled Nursing Facility | Payer: Medicare Other | Attending: Orthopaedic Surgery | Admitting: Orthopaedic Surgery

## 2013-08-07 ENCOUNTER — Encounter (HOSPITAL_COMMUNITY): Payer: Medicare Other | Admitting: Anesthesiology

## 2013-08-07 ENCOUNTER — Emergency Department (HOSPITAL_COMMUNITY): Payer: Medicare Other | Admitting: Anesthesiology

## 2013-08-07 DIAGNOSIS — T84028A Dislocation of other internal joint prosthesis, initial encounter: Secondary | ICD-10-CM

## 2013-08-07 DIAGNOSIS — G309 Alzheimer's disease, unspecified: Secondary | ICD-10-CM | POA: Diagnosis present

## 2013-08-07 DIAGNOSIS — F411 Generalized anxiety disorder: Secondary | ICD-10-CM | POA: Diagnosis present

## 2013-08-07 DIAGNOSIS — J449 Chronic obstructive pulmonary disease, unspecified: Secondary | ICD-10-CM | POA: Diagnosis present

## 2013-08-07 DIAGNOSIS — IMO0002 Reserved for concepts with insufficient information to code with codable children: Secondary | ICD-10-CM

## 2013-08-07 DIAGNOSIS — R262 Difficulty in walking, not elsewhere classified: Secondary | ICD-10-CM | POA: Diagnosis not present

## 2013-08-07 DIAGNOSIS — M24459 Recurrent dislocation, unspecified hip: Secondary | ICD-10-CM | POA: Diagnosis not present

## 2013-08-07 DIAGNOSIS — J90 Pleural effusion, not elsewhere classified: Secondary | ICD-10-CM | POA: Diagnosis not present

## 2013-08-07 DIAGNOSIS — R9431 Abnormal electrocardiogram [ECG] [EKG]: Secondary | ICD-10-CM | POA: Diagnosis not present

## 2013-08-07 DIAGNOSIS — F0280 Dementia in other diseases classified elsewhere without behavioral disturbance: Secondary | ICD-10-CM | POA: Diagnosis present

## 2013-08-07 DIAGNOSIS — E41 Nutritional marasmus: Secondary | ICD-10-CM | POA: Diagnosis not present

## 2013-08-07 DIAGNOSIS — T84028D Dislocation of other internal joint prosthesis, subsequent encounter: Secondary | ICD-10-CM

## 2013-08-07 DIAGNOSIS — M810 Age-related osteoporosis without current pathological fracture: Secondary | ICD-10-CM | POA: Diagnosis present

## 2013-08-07 DIAGNOSIS — Z7982 Long term (current) use of aspirin: Secondary | ICD-10-CM | POA: Diagnosis not present

## 2013-08-07 DIAGNOSIS — N181 Chronic kidney disease, stage 1: Secondary | ICD-10-CM | POA: Diagnosis present

## 2013-08-07 DIAGNOSIS — I10 Essential (primary) hypertension: Secondary | ICD-10-CM | POA: Diagnosis present

## 2013-08-07 DIAGNOSIS — T84029A Dislocation of unspecified internal joint prosthesis, initial encounter: Secondary | ICD-10-CM | POA: Diagnosis not present

## 2013-08-07 DIAGNOSIS — Z66 Do not resuscitate: Secondary | ICD-10-CM | POA: Diagnosis present

## 2013-08-07 DIAGNOSIS — R05 Cough: Secondary | ICD-10-CM

## 2013-08-07 DIAGNOSIS — Y831 Surgical operation with implant of artificial internal device as the cause of abnormal reaction of the patient, or of later complication, without mention of misadventure at the time of the procedure: Secondary | ICD-10-CM | POA: Diagnosis present

## 2013-08-07 DIAGNOSIS — R627 Adult failure to thrive: Secondary | ICD-10-CM | POA: Diagnosis not present

## 2013-08-07 DIAGNOSIS — J189 Pneumonia, unspecified organism: Secondary | ICD-10-CM | POA: Diagnosis not present

## 2013-08-07 DIAGNOSIS — F3289 Other specified depressive episodes: Secondary | ICD-10-CM | POA: Diagnosis present

## 2013-08-07 DIAGNOSIS — I129 Hypertensive chronic kidney disease with stage 1 through stage 4 chronic kidney disease, or unspecified chronic kidney disease: Secondary | ICD-10-CM | POA: Diagnosis present

## 2013-08-07 DIAGNOSIS — R6889 Other general symptoms and signs: Secondary | ICD-10-CM | POA: Diagnosis not present

## 2013-08-07 DIAGNOSIS — D649 Anemia, unspecified: Secondary | ICD-10-CM | POA: Diagnosis present

## 2013-08-07 DIAGNOSIS — Z96649 Presence of unspecified artificial hip joint: Secondary | ICD-10-CM | POA: Diagnosis not present

## 2013-08-07 DIAGNOSIS — E119 Type 2 diabetes mellitus without complications: Secondary | ICD-10-CM | POA: Diagnosis present

## 2013-08-07 DIAGNOSIS — E43 Unspecified severe protein-calorie malnutrition: Secondary | ICD-10-CM | POA: Diagnosis present

## 2013-08-07 DIAGNOSIS — T84099A Other mechanical complication of unspecified internal joint prosthesis, initial encounter: Secondary | ICD-10-CM | POA: Diagnosis not present

## 2013-08-07 DIAGNOSIS — S43006A Unspecified dislocation of unspecified shoulder joint, initial encounter: Secondary | ICD-10-CM

## 2013-08-07 DIAGNOSIS — Z79899 Other long term (current) drug therapy: Secondary | ICD-10-CM | POA: Diagnosis not present

## 2013-08-07 DIAGNOSIS — I509 Heart failure, unspecified: Secondary | ICD-10-CM | POA: Diagnosis not present

## 2013-08-07 DIAGNOSIS — F329 Major depressive disorder, single episode, unspecified: Secondary | ICD-10-CM | POA: Diagnosis present

## 2013-08-07 DIAGNOSIS — M109 Gout, unspecified: Secondary | ICD-10-CM | POA: Diagnosis present

## 2013-08-07 DIAGNOSIS — J9600 Acute respiratory failure, unspecified whether with hypoxia or hypercapnia: Secondary | ICD-10-CM | POA: Diagnosis not present

## 2013-08-07 DIAGNOSIS — I5033 Acute on chronic diastolic (congestive) heart failure: Secondary | ICD-10-CM | POA: Diagnosis not present

## 2013-08-07 DIAGNOSIS — Z471 Aftercare following joint replacement surgery: Secondary | ICD-10-CM | POA: Diagnosis not present

## 2013-08-07 DIAGNOSIS — R29818 Other symptoms and signs involving the nervous system: Secondary | ICD-10-CM | POA: Diagnosis not present

## 2013-08-07 DIAGNOSIS — I4430 Unspecified atrioventricular block: Secondary | ICD-10-CM | POA: Diagnosis present

## 2013-08-07 DIAGNOSIS — R279 Unspecified lack of coordination: Secondary | ICD-10-CM | POA: Diagnosis not present

## 2013-08-07 DIAGNOSIS — S73004A Unspecified dislocation of right hip, initial encounter: Secondary | ICD-10-CM

## 2013-08-07 DIAGNOSIS — J4489 Other specified chronic obstructive pulmonary disease: Secondary | ICD-10-CM | POA: Diagnosis present

## 2013-08-07 DIAGNOSIS — S73006A Unspecified dislocation of unspecified hip, initial encounter: Secondary | ICD-10-CM | POA: Diagnosis not present

## 2013-08-07 DIAGNOSIS — M25559 Pain in unspecified hip: Secondary | ICD-10-CM | POA: Diagnosis not present

## 2013-08-07 HISTORY — PX: HIP CLOSED REDUCTION: SHX983

## 2013-08-07 LAB — CBC WITH DIFFERENTIAL/PLATELET
Eosinophils Relative: 1 % (ref 0–5)
HCT: 34.8 % — ABNORMAL LOW (ref 36.0–46.0)
Hemoglobin: 11.6 g/dL — ABNORMAL LOW (ref 12.0–15.0)
Lymphocytes Relative: 21 % (ref 12–46)
Lymphs Abs: 1.4 10*3/uL (ref 0.7–4.0)
MCHC: 33.3 g/dL (ref 30.0–36.0)
MCV: 89.2 fL (ref 78.0–100.0)
Monocytes Absolute: 0.4 10*3/uL (ref 0.1–1.0)
Monocytes Relative: 6 % (ref 3–12)
Neutro Abs: 4.6 10*3/uL (ref 1.7–7.7)
Neutrophils Relative %: 71 % (ref 43–77)
RBC: 3.9 MIL/uL (ref 3.87–5.11)
RDW: 14.5 % (ref 11.5–15.5)
WBC: 6.5 10*3/uL (ref 4.0–10.5)

## 2013-08-07 LAB — BASIC METABOLIC PANEL
CO2: 26 mEq/L (ref 19–32)
Chloride: 106 mEq/L (ref 96–112)
Creatinine, Ser: 0.77 mg/dL (ref 0.50–1.10)
GFR calc Af Amer: 80 mL/min — ABNORMAL LOW (ref >90)
Potassium: 3.4 mEq/L — ABNORMAL LOW (ref 3.5–5.1)

## 2013-08-07 LAB — TYPE AND SCREEN
ABO/RH(D): A NEG
Antibody Screen: NEGATIVE

## 2013-08-07 SURGERY — CLOSED MANIPULATION, JOINT, HIP
Anesthesia: General | Site: Leg Upper | Laterality: Right | Wound class: Clean

## 2013-08-07 MED ORDER — PROPOFOL 10 MG/ML IV EMUL
INTRAVENOUS | Status: DC | PRN
  Administered 2013-08-07: 1.5 mL via INTRAVENOUS

## 2013-08-07 MED ORDER — PROPOFOL 10 MG/ML IV BOLUS
30.0000 mg | INTRAVENOUS | Status: DC | PRN
  Administered 2013-08-07: 30 mg via INTRAVENOUS
  Filled 2013-08-07: qty 20

## 2013-08-07 MED ORDER — PROPOFOL 10 MG/ML IV BOLUS: 0.2500 mg/kg | INTRAVENOUS | Status: DC | PRN

## 2013-08-07 MED ORDER — PROPOFOL 10 MG/ML IV BOLUS
INTRAVENOUS | Status: DC | PRN
  Administered 2013-08-07: 30 mg via INTRAVENOUS
  Administered 2013-08-07: 60 mg via INTRAVENOUS

## 2013-08-07 MED ORDER — PROPOFOL 10 MG/ML IV BOLUS
0.7500 mg/kg | INTRAVENOUS | Status: DC | PRN
  Filled 2013-08-07: qty 20

## 2013-08-07 NOTE — ED Notes (Signed)
Infusions of Propofol charted in error. The medication was given IV push in increments of 1.5 mL or 15 mg - 3.0 mL or 30 mg.

## 2013-08-07 NOTE — ED Provider Notes (Signed)
CSN: [id number]     Arrival date & time 08/07/13  2005 History   First MD Initiated Contact with Patient 08/07/13 2022     Chief Complaint  Patient presents with  . Hip Pain    HPI Pt has history of prior hip replacement.  She was bending over this evening when she felt a pop in her right hip.  Since that time she has been unable to stand and developed severe pain in that hip.  No other pain. No difficulty breathing.  EMS gave her 50 mcg of fentanyl and currently her pain is 3/10 Past Medical History  Diagnosis Date  . COPD (chronic obstructive pulmonary disease)   . Hypertension   . Oral aphthae   . Orthostatic hypotension   . Senile osteoporosis   . Atrophy of vulva   . Unspecified urinary incontinence   . Urinary frequency   . Unspecified vitamin D deficiency   . Anxiety state, unspecified   . Restless legs syndrome (RLS)   . Unspecified disorder of kidney and ureter   . Atrioventricular block, unspecified   . Closed dislocation of shoulder, unspecified site   . Anemia, unspecified   . Altered mental status   . Lumbago   . Transient disorder of initiating or maintaining sleep   . Cellulitis and abscess of unspecified site   . Sebaceous cyst   . Insomnia, unspecified   . Dementia in conditions classified elsewhere without behavioral disturbance(294.10)   . Depressive disorder, not elsewhere classified   . Abnormality of gait   . Adult failure to thrive   . Headache(784.0)   . Type II or unspecified type diabetes mellitus without mention of complication, not stated as uncontrolled   . Gout, unspecified   . Extrinsic asthma, unspecified   . Chronic kidney disease, stage I   . Osteoarthrosis, unspecified whether generalized or localized, unspecified site   . Spinal stenosis, unspecified region other than cervical   . Edema   . Tachycardia, unspecified    Past Surgical History  Procedure Laterality Date  . Hip surgery      (R) hip replacement  . Cesarean section    .  Abdominal hysterectomy      partial  . Back surgery      fusion  . Breast surgery      bilateral breast reduction  . Eye surgery      bilateral cataract  . Basal cell carcinoma excision    . Basal cell carcinoma excision      face   Family History  Problem Relation Age of Onset  . Diabetes Mother   . Diabetes Father   . Stroke Sister   . Heart attack Brother    History  Substance Use Topics  . Smoking status: Never Smoker   . Smokeless tobacco: Not on file  . Alcohol Use: No   OB History   Grav Para Term Preterm Abortions TAB SAB Ect Mult Living                 Review of Systems  All other systems reviewed and are negative.    Allergies  Sulfa antibiotics; Clonidine derivatives; and Lactose intolerance (gi)  Home Medications   Current Outpatient Rx  Name  Route  Sig  Dispense  Refill  . clonazePAM (KLONOPIN) 1 MG tablet   Oral   Take 1 mg by mouth at bedtime.          Marland Kitchen HYDROcodone-acetaminophen (NORCO/VICODIN) 5-325 MG per  tablet   Oral   Take 1 tablet by mouth every 6 (six) hours as needed for pain.         Marland Kitchen tiZANidine (ZANAFLEX) 2 MG tablet   Oral   Take 2 mg by mouth 2 (two) times daily.         Marland Kitchen triamcinolone cream (KENALOG) 0.5 %   Topical   Apply 1 application topically 2 (two) times daily. To rash on legs   30 g   3    BP 173/111  Pulse 63  Temp(Src) 97.7 F (36.5 C) (Oral)  Resp 23  Ht 4\' 8"  (1.422 m)  Wt 92 lb (41.731 kg)  BMI 20.64 kg/m2  SpO2 94% Physical Exam  Nursing note and vitals reviewed. Constitutional: She appears well-developed and well-nourished. No distress.  Frail, elderly  HENT:  Head: Normocephalic and atraumatic.  Right Ear: External ear normal.  Left Ear: External ear normal.  Eyes: Conjunctivae are normal. Right eye exhibits no discharge. Left eye exhibits no discharge. No scleral icterus.  Neck: Neck supple. No tracheal deviation present.  Cardiovascular: Normal rate, regular rhythm and intact distal  pulses.   Pulmonary/Chest: Effort normal and breath sounds normal. No stridor. No respiratory distress. She has no wheezes. She has no rales.  Abdominal: Soft. Bowel sounds are normal. She exhibits no distension. There is no tenderness. There is no rebound and no guarding.  Musculoskeletal: She exhibits tenderness. She exhibits no edema.       Right hip: She exhibits decreased range of motion, tenderness and deformity.  Shortened right lower extremity, foot externally rotated, distal neurovascular intact  Neurological: She is alert. She has normal strength. No sensory deficit. Cranial nerve deficit:  no gross defecits noted. She exhibits normal muscle tone. She displays no seizure activity. Coordination normal.  Skin: Skin is warm and dry. No rash noted.  Psychiatric: She has a normal mood and affect.    ED Course  Procedural sedation Date/Time: 08/07/2013 11:29 PM Performed by: Linwood Dibbles R Authorized by: Linwood Dibbles R Consent: Verbal consent obtained. written consent obtained. Risks and benefits: risks, benefits and alternatives were discussed Consent given by: patient Time out: Immediately prior to procedure a "time out" was called to verify the correct patient, procedure, equipment, support staff and site/side marked as required. Patient sedated: yes Sedatives: propofol Sedation end date/time: 08/07/2013 11:29 PM Vitals: Vital signs were monitored during sedation. Patient tolerance: Patient tolerated the procedure well with no immediate complications. Comments: See nursing notes for stop and start times.  Supplemental oxygen provided.  Few assisted ventilation with bag valve mask for 02 sat 92%.  Tolerated well.     (including critical care time) Labs Review Labs Reviewed  BASIC METABOLIC PANEL - Abnormal; Notable for the following:    Potassium 3.4 (*)    Glucose, Bld 131 (*)    GFR calc non Af Amer 69 (*)    GFR calc Af Amer 80 (*)    All other components within normal limits   CBC WITH DIFFERENTIAL - Abnormal; Notable for the following:    Hemoglobin 11.6 (*)    HCT 34.8 (*)    All other components within normal limits  TYPE AND SCREEN   Imaging Review Dg Hip Complete Right  08/07/2013   CLINICAL DATA:  Hip pain.  EXAM: RIGHT HIP - COMPLETE 2+ VIEW  COMPARISON:  09/24/2012  FINDINGS: Again noted is superior dislocation of the right hip replacement, similar to prior study. No fracture. Diffuse  osteopenia. Mild degenerative changes in the left hip.  IMPRESSION: Superior dislocation of the right hip replacement.   Electronically Signed   By: Charlett Nose M.D.   On: 08/07/2013 20:59    EKG Interpretation   None       MDM   1. Dislocated hip, right, initial encounter    Reduction attempted by Dr Janee Morn.  Several attempts unfortunately without success.  Propofol given in 15-30 mg doses. Dr Janee Morn plans on taking patient to the OR     Celene Kras, MD 08/07/13 [phone]

## 2013-08-07 NOTE — ED Notes (Signed)
Pt transported to OR with anesthesiologist at bedside. Per EDP and Ortho MD request. Consent signed. VSS.

## 2013-08-07 NOTE — ED Notes (Signed)
Onset today sitting in a chair tried to stomp on a bug heard pop in right hip. EMS called shortening and rotation right lower extremity with pain 9/10 per EMS.  IV Left FA 20g Fentanyl 50 mcg given pain currently 3/10. Alert answering and following commands appropriate.

## 2013-08-07 NOTE — Anesthesia Preprocedure Evaluation (Signed)
Anesthesia Evaluation  Patient identified by MRN, date of birth, ID band Patient awake    Reviewed: Allergy & Precautions, H&P , NPO status , Patient's Chart, lab work & pertinent test results  History of Anesthesia Complications Negative for: history of anesthetic complications  Airway Mallampati: II TM Distance: >3 FB Neck ROM: Full    Dental  (+) Teeth Intact and Dental Advisory Given   Pulmonary asthma , COPD   Pulmonary exam normal       Cardiovascular hypertension, + dysrhythmias     Neuro/Psych  Headaches, PSYCHIATRIC DISORDERS Anxiety Depression    GI/Hepatic negative GI ROS, Neg liver ROS,   Endo/Other  diabetes  Renal/GU Renal InsufficiencyRenal disease     Musculoskeletal   Abdominal   Peds  Hematology   Anesthesia Other Findings   Reproductive/Obstetrics                           Anesthesia Physical Anesthesia Plan  ASA: III  Anesthesia Plan: General   Post-op Pain Management:    Induction: Intravenous  Airway Management Planned: Mask  Additional Equipment:   Intra-op Plan:   Post-operative Plan:   Informed Consent: I have reviewed the patients History and Physical, chart, labs and discussed the procedure including the risks, benefits and alternatives for the proposed anesthesia with the patient or authorized representative who has indicated his/her understanding and acceptance.   Dental advisory given  Plan Discussed with: CRNA, Anesthesiologist and Surgeon  Anesthesia Plan Comments:         Anesthesia Quick Evaluation

## 2013-08-07 NOTE — ED Notes (Signed)
X-ray at bedside

## 2013-08-08 ENCOUNTER — Emergency Department (HOSPITAL_COMMUNITY): Payer: Medicare Other

## 2013-08-08 ENCOUNTER — Encounter (HOSPITAL_COMMUNITY)
Admission: EM | Disposition: A | Discharge status: Skilled Nursing Facility | Payer: Self-pay | Source: Home / Self Care | Attending: Orthopaedic Surgery

## 2013-08-08 ENCOUNTER — Encounter (HOSPITAL_COMMUNITY): Payer: Medicare Other | Admitting: Anesthesiology

## 2013-08-08 ENCOUNTER — Encounter (HOSPITAL_COMMUNITY): Payer: Self-pay | Admitting: Certified Registered"

## 2013-08-08 ENCOUNTER — Inpatient Hospital Stay (HOSPITAL_COMMUNITY): Payer: Medicare Other | Admitting: Anesthesiology

## 2013-08-08 DIAGNOSIS — T84028A Dislocation of other internal joint prosthesis, initial encounter: Secondary | ICD-10-CM

## 2013-08-08 HISTORY — PX: TOTAL HIP REVISION: SHX763

## 2013-08-08 SURGERY — TOTAL HIP REVISION
Anesthesia: General | Site: Hip | Laterality: Right | Wound class: Clean

## 2013-08-08 MED ORDER — 0.9 % SODIUM CHLORIDE (POUR BTL) OPTIME
TOPICAL | Status: DC | PRN
  Administered 2013-08-08: 1000 mL

## 2013-08-08 MED ORDER — SUCCINYLCHOLINE CHLORIDE 20 MG/ML IJ SOLN
INTRAMUSCULAR | Status: DC | PRN
  Administered 2013-08-07: 60 mg via INTRAVENOUS
  Administered 2013-08-08: 40 mg via INTRAVENOUS

## 2013-08-08 MED ORDER — TRIAMCINOLONE ACETONIDE 0.5 % EX CREA
1.0000 "application " | TOPICAL_CREAM | Freq: Two times a day (BID) | CUTANEOUS | Status: DC
  Administered 2013-08-08 – 2013-08-13 (×10): 1 via TOPICAL
  Filled 2013-08-08: qty 15

## 2013-08-08 MED ORDER — LIDOCAINE HCL (CARDIAC) 20 MG/ML IV SOLN
INTRAVENOUS | Status: DC | PRN
  Administered 2013-08-08: 60 mg via INTRAVENOUS

## 2013-08-08 MED ORDER — HYDROCODONE-ACETAMINOPHEN 5-325 MG PO TABS
1.0000 | ORAL_TABLET | Freq: Four times a day (QID) | ORAL | Status: DC | PRN
  Administered 2013-08-08 – 2013-08-09 (×3): 1 via ORAL
  Filled 2013-08-08 (×4): qty 1

## 2013-08-08 MED ORDER — DOCUSATE SODIUM 100 MG PO CAPS
100.0000 mg | ORAL_CAPSULE | Freq: Two times a day (BID) | ORAL | Status: DC
  Administered 2013-08-08 – 2013-08-12 (×8): 100 mg via ORAL
  Filled 2013-08-08 (×15): qty 1

## 2013-08-08 MED ORDER — CEFAZOLIN SODIUM-DEXTROSE 2-3 GM-% IV SOLR
2.0000 g | Freq: Four times a day (QID) | INTRAVENOUS | Status: AC
  Administered 2013-08-08 – 2013-08-09 (×2): 2 g via INTRAVENOUS
  Filled 2013-08-08 (×2): qty 50

## 2013-08-08 MED ORDER — METOCLOPRAMIDE HCL 10 MG PO TABS: 5.0000 mg | ORAL_TABLET | Freq: Three times a day (TID) | ORAL | Status: DC | PRN

## 2013-08-08 MED ORDER — TIZANIDINE HCL 2 MG PO TABS
2.0000 mg | ORAL_TABLET | Freq: Two times a day (BID) | ORAL | Status: DC
  Administered 2013-08-08 – 2013-08-15 (×14): 2 mg via ORAL
  Filled 2013-08-08 (×17): qty 1

## 2013-08-08 MED ORDER — ALUM & MAG HYDROXIDE-SIMETH 200-200-20 MG/5ML PO SUSP: 30.0000 mL | ORAL | Status: DC | PRN

## 2013-08-08 MED ORDER — ACETAMINOPHEN 650 MG RE SUPP: 650.0000 mg | Freq: Four times a day (QID) | RECTAL | Status: DC | PRN

## 2013-08-08 MED ORDER — ONDANSETRON HCL 4 MG/2ML IJ SOLN: 4.0000 mg | Freq: Once | INTRAMUSCULAR | Status: DC | PRN

## 2013-08-08 MED ORDER — NEOSTIGMINE METHYLSULFATE 1 MG/ML IJ SOLN
INTRAMUSCULAR | Status: DC | PRN
  Administered 2013-08-08: 3 mg via INTRAVENOUS

## 2013-08-08 MED ORDER — LABETALOL HCL 5 MG/ML IV SOLN
INTRAVENOUS | Status: DC | PRN
  Administered 2013-08-08: 5 mg via INTRAVENOUS

## 2013-08-08 MED ORDER — ONDANSETRON HCL 4 MG/2ML IJ SOLN: 4.0000 mg | Freq: Four times a day (QID) | INTRAMUSCULAR | Status: DC | PRN

## 2013-08-08 MED ORDER — TRANEXAMIC ACID 100 MG/ML IV SOLN
1000.0000 mg | INTRAVENOUS | Status: AC
  Administered 2013-08-08: 1000 mg via INTRAVENOUS
  Filled 2013-08-08: qty 10

## 2013-08-08 MED ORDER — ONDANSETRON HCL 4 MG PO TABS: 4.0000 mg | ORAL_TABLET | Freq: Four times a day (QID) | ORAL | Status: DC | PRN

## 2013-08-08 MED ORDER — ONDANSETRON HCL 4 MG/2ML IJ SOLN
INTRAMUSCULAR | Status: DC | PRN
  Administered 2013-08-08: 4 mg via INTRAVENOUS

## 2013-08-08 MED ORDER — ACETAMINOPHEN 325 MG PO TABS
650.0000 mg | ORAL_TABLET | Freq: Four times a day (QID) | ORAL | Status: DC | PRN
  Administered 2013-08-11 – 2013-08-13 (×2): 650 mg via ORAL
  Filled 2013-08-08 (×4): qty 2

## 2013-08-08 MED ORDER — ASPIRIN EC 325 MG PO TBEC
325.0000 mg | DELAYED_RELEASE_TABLET | Freq: Two times a day (BID) | ORAL | Status: DC
  Administered 2013-08-09 – 2013-08-13 (×8): 325 mg via ORAL
  Filled 2013-08-08 (×15): qty 1

## 2013-08-08 MED ORDER — ARTIFICIAL TEARS OP OINT
TOPICAL_OINTMENT | OPHTHALMIC | Status: DC | PRN
  Administered 2013-08-07: 1 via OPHTHALMIC

## 2013-08-08 MED ORDER — METOCLOPRAMIDE HCL 5 MG/ML IJ SOLN: 5.0000 mg | Freq: Three times a day (TID) | INTRAMUSCULAR | Status: DC | PRN

## 2013-08-08 MED ORDER — PROPOFOL 10 MG/ML IV BOLUS
INTRAVENOUS | Status: DC | PRN
  Administered 2013-08-08: 100 mg via INTRAVENOUS

## 2013-08-08 MED ORDER — FERROUS SULFATE 325 (65 FE) MG PO TABS
325.0000 mg | ORAL_TABLET | Freq: Three times a day (TID) | ORAL | Status: DC
  Administered 2013-08-08 – 2013-08-15 (×17): 325 mg via ORAL
  Filled 2013-08-08 (×23): qty 1

## 2013-08-08 MED ORDER — SODIUM CHLORIDE 0.9 % IV SOLN
INTRAVENOUS | Status: DC | PRN
  Administered 2013-08-07: via INTRAVENOUS

## 2013-08-08 MED ORDER — DIPHENHYDRAMINE HCL 12.5 MG/5ML PO ELIX: 12.5000 mg | ORAL_SOLUTION | ORAL | Status: DC | PRN

## 2013-08-08 MED ORDER — ROCURONIUM BROMIDE 100 MG/10ML IV SOLN
INTRAVENOUS | Status: DC | PRN
  Administered 2013-08-08: 40 mg via INTRAVENOUS

## 2013-08-08 MED ORDER — SUFENTANIL CITRATE 50 MCG/ML IV SOLN
INTRAVENOUS | Status: DC | PRN
  Administered 2013-08-08: 10 ug via INTRAVENOUS

## 2013-08-08 MED ORDER — GLYCOPYRROLATE 0.2 MG/ML IJ SOLN
INTRAMUSCULAR | Status: DC | PRN
  Administered 2013-08-08: 0.4 mg via INTRAVENOUS

## 2013-08-08 MED ORDER — TEMAZEPAM 15 MG PO CAPS: 15.0000 mg | ORAL_CAPSULE | Freq: Every evening | ORAL | Status: DC | PRN

## 2013-08-08 MED ORDER — DEXTROSE IN LACTATED RINGERS 5 % IV SOLN
75.0000 mL/h | INTRAVENOUS | Status: DC
  Administered 2013-08-08: 75 mL/h via INTRAVENOUS

## 2013-08-08 MED ORDER — MORPHINE SULFATE 2 MG/ML IJ SOLN: 1.0000 mg | INTRAMUSCULAR | Status: DC | PRN

## 2013-08-08 MED ORDER — ACETAMINOPHEN 10 MG/ML IV SOLN
1000.0000 mg | INTRAVENOUS | Status: AC
  Administered 2013-08-08: 1000 mg via INTRAVENOUS
  Filled 2013-08-08: qty 100

## 2013-08-08 MED ORDER — FENTANYL CITRATE 0.05 MG/ML IJ SOLN: 25.0000 ug | INTRAMUSCULAR | Status: DC | PRN

## 2013-08-08 MED ORDER — OXYCODONE HCL 5 MG PO TABS
5.0000 mg | ORAL_TABLET | ORAL | Status: DC | PRN
  Administered 2013-08-09: 5 mg via ORAL
  Filled 2013-08-08: qty 2

## 2013-08-08 MED ORDER — CEFAZOLIN SODIUM-DEXTROSE 2-3 GM-% IV SOLR
INTRAVENOUS | Status: AC
  Administered 2013-08-08: 2 g via INTRAVENOUS
  Filled 2013-08-08: qty 50

## 2013-08-08 MED ORDER — EPHEDRINE SULFATE 50 MG/ML IJ SOLN
INTRAMUSCULAR | Status: DC | PRN
  Administered 2013-08-08 (×2): 10 mg via INTRAVENOUS

## 2013-08-08 MED ORDER — KCL IN DEXTROSE-NACL 20-5-0.45 MEQ/L-%-% IV SOLN
INTRAVENOUS | Status: DC
  Administered 2013-08-08: 02:00:00 via INTRAVENOUS
  Filled 2013-08-08 (×3): qty 1000

## 2013-08-08 MED ORDER — BUPIVACAINE-EPINEPHRINE PF 0.25-1:200000 % IJ SOLN
INTRAMUSCULAR | Status: AC
  Filled 2013-08-08: qty 30

## 2013-08-08 MED ORDER — CLONAZEPAM 1 MG PO TABS
1.0000 mg | ORAL_TABLET | Freq: Every day | ORAL | Status: DC
  Administered 2013-08-08 – 2013-08-14 (×8): 1 mg via ORAL
  Filled 2013-08-08 (×8): qty 1

## 2013-08-08 MED ORDER — LACTATED RINGERS IV SOLN
INTRAVENOUS | Status: DC | PRN
  Administered 2013-08-08: 14:00:00 via INTRAVENOUS

## 2013-08-08 MED ORDER — MENTHOL 3 MG MT LOZG: 1.0000 | LOZENGE | OROMUCOSAL | Status: DC | PRN

## 2013-08-08 MED ORDER — LACTATED RINGERS IV SOLN
Freq: Once | INTRAVENOUS | Status: AC
  Administered 2013-08-08: 14:00:00 via INTRAVENOUS

## 2013-08-08 MED ORDER — PHENOL 1.4 % MT LIQD: 1.0000 | OROMUCOSAL | Status: DC | PRN

## 2013-08-08 MED ORDER — BUPIVACAINE-EPINEPHRINE PF 0.25-1:200000 % IJ SOLN
INTRAMUSCULAR | Status: DC | PRN
  Administered 2013-08-08: 20 mL

## 2013-08-08 SURGICAL SUPPLY — 62 items
BLADE SURG ROTATE 9660 (MISCELLANEOUS) IMPLANT
BRUSH FEMORAL CANAL (MISCELLANEOUS) IMPLANT
CLOTH BEACON ORANGE TIMEOUT ST (SAFETY) ×2 IMPLANT
COVER SURGICAL LIGHT HANDLE (MISCELLANEOUS) ×2 IMPLANT
DECANTER SPIKE VIAL GLASS SM (MISCELLANEOUS) ×1 IMPLANT
DRAPE INCISE IOBAN 66X45 STRL (DRAPES) IMPLANT
DRAPE ORTHO SPLIT 77X108 STRL (DRAPES) ×4
DRAPE PROXIMA HALF (DRAPES) ×2 IMPLANT
DRAPE SURG ORHT 6 SPLT 77X108 (DRAPES) ×2 IMPLANT
DRAPE U-SHAPE 47X51 STRL (DRAPES) ×2 IMPLANT
DRILL BIT 7/64X5 (BIT) ×2 IMPLANT
DRSG ADAPTIC 3X8 NADH LF (GAUZE/BANDAGES/DRESSINGS) ×2 IMPLANT
DRSG MEPITEL 4X7.2 (GAUZE/BANDAGES/DRESSINGS) ×1 IMPLANT
DRSG PAD ABDOMINAL 8X10 ST (GAUZE/BANDAGES/DRESSINGS) ×4 IMPLANT
DURAPREP 26ML APPLICATOR (WOUND CARE) ×2 IMPLANT
ELECT BLADE 6.5 EXT (BLADE) IMPLANT
ELECT REM PT RETURN 9FT ADLT (ELECTROSURGICAL) ×2
ELECTRODE REM PT RTRN 9FT ADLT (ELECTROSURGICAL) ×1 IMPLANT
EVACUATOR 1/8 PVC DRAIN (DRAIN) IMPLANT
GLOVE BIO SURGEON STRL SZ8.5 (GLOVE) ×2 IMPLANT
GLOVE BIOGEL PI IND STRL 8 (GLOVE) ×1 IMPLANT
GLOVE BIOGEL PI IND STRL 8.5 (GLOVE) ×1 IMPLANT
GLOVE BIOGEL PI INDICATOR 8 (GLOVE) ×1
GLOVE BIOGEL PI INDICATOR 8.5 (GLOVE) ×1
GLOVE SS BIOGEL STRL SZ 8 (GLOVE) ×1 IMPLANT
GLOVE SUPERSENSE BIOGEL SZ 8 (GLOVE) ×1
GOWN PREVENTION PLUS XLARGE (GOWN DISPOSABLE) ×6 IMPLANT
GOWN PREVENTION PLUS XXLARGE (GOWN DISPOSABLE) ×2 IMPLANT
GOWN STRL NON-REIN LRG LVL3 (GOWN DISPOSABLE) ×2 IMPLANT
HANDPIECE INTERPULSE COAX TIP (DISPOSABLE)
HEAD FEM STD 28X+1.5 STRL (Hips) ×1 IMPLANT
HOOD PEEL AWAY FACE SHEILD DIS (HOOD) ×4 IMPLANT
IMMOBILIZER KNEE 20 (SOFTGOODS)
IMMOBILIZER KNEE 20 THIGH 36 (SOFTGOODS) IMPLANT
IMMOBILIZER KNEE 22 UNIV (SOFTGOODS) IMPLANT
IMMOBILIZER KNEE 24 THIGH 36 (MISCELLANEOUS) IMPLANT
IMMOBILIZER KNEE 24 UNIV (MISCELLANEOUS)
KIT BASIN OR (CUSTOM PROCEDURE TRAY) ×2 IMPLANT
KIT ROOM TURNOVER OR (KITS) ×2 IMPLANT
LINER ACETABULAR 28MM +4 ESCON (Hips) ×1 IMPLANT
MANIFOLD NEPTUNE II (INSTRUMENTS) ×2 IMPLANT
NDL 1/2 CIR MAYO (NEEDLE) IMPLANT
NEEDLE 1/2 CIR MAYO (NEEDLE) IMPLANT
NS IRRIG 1000ML POUR BTL (IV SOLUTION) ×2 IMPLANT
PACK TOTAL JOINT (CUSTOM PROCEDURE TRAY) ×2 IMPLANT
PAD ARMBOARD 7.5X6 YLW CONV (MISCELLANEOUS) ×4 IMPLANT
PRESSURIZER FEMORAL UNIV (MISCELLANEOUS) IMPLANT
SET HNDPC FAN SPRY TIP SCT (DISPOSABLE) IMPLANT
SPONGE GAUZE 4X4 12PLY (GAUZE/BANDAGES/DRESSINGS) ×2 IMPLANT
STAPLER VISISTAT 35W (STAPLE) ×2 IMPLANT
SUCTION FRAZIER TIP 10 FR DISP (SUCTIONS) ×2 IMPLANT
SUT ETHIBOND NAB CT1 #1 30IN (SUTURE) ×8 IMPLANT
SUT VIC AB 0 CT1 27 (SUTURE) ×2
SUT VIC AB 0 CT1 27XBRD ANBCTR (SUTURE) ×1 IMPLANT
SUT VIC AB 1 CTB1 27 (SUTURE) ×6 IMPLANT
SUT VIC AB 2-0 CT1 27 (SUTURE) ×2
SUT VIC AB 2-0 CT1 TAPERPNT 27 (SUTURE) ×1 IMPLANT
TOWEL OR 17X24 6PK STRL BLUE (TOWEL DISPOSABLE) ×2 IMPLANT
TOWEL OR 17X26 10 PK STRL BLUE (TOWEL DISPOSABLE) ×2 IMPLANT
TOWER CARTRIDGE SMART MIX (DISPOSABLE) IMPLANT
TRAY FOLEY CATH 16FRSI W/METER (SET/KITS/TRAYS/PACK) IMPLANT
WATER STERILE IRR 1000ML POUR (IV SOLUTION) ×8 IMPLANT

## 2013-08-08 NOTE — Preoperative (Signed)
Beta Blockers   Reason not to administer Beta Blockers:Not Applicable 

## 2013-08-08 NOTE — Progress Notes (Signed)
INITIAL NUTRITION ASSESSMENT  DOCUMENTATION CODES Per approved criteria  -Severe malnutrition in the context of chronic illness   INTERVENTION: Recommend Ensure Complete po BID, each supplement provides 350 kcal and 13 grams of protein, once diet permits. RD to continue to follow nutrition care plan.  NUTRITION DIAGNOSIS: Increased nutrient needs related to post-op heailng as evidenced by estimated needs.   Goal: Diet advancement. Intake to meet >90% of estimated nutrition needs.  Monitor:  weight trends, lab trends, I/O's, diet advancement  Reason for Assessment: Malnutrition Screening Tool  79 y.o. female  Admitting Dx: Dislocated right total hip arthroplasty  ASSESSMENT: PMHx significant for COPD, anxiety, dementia, depression, adult FTT, and DM. Patient is s/p R total hip replacement in 2010, having undergone 4-5 dislocations previously, the last in December, 2013. Admitted with dislocated R total hip arthroplasty.   Underwent attempted closed reduction of R total hip dislocation on 11/11. Plan on revision today per ortho team's notes.  Pt with 12% wt loss x 8 months. Pt unable to provide hx, no family at bedside. NPO for procedure.  Potassium was low on admission at 3.4.  Nutrition Focused Physical Exam:  Subcutaneous Fat:  Orbital Region: moderate depletion Upper Arm Region: moderate depletion Thoracic and Lumbar Region: n/a  Muscle:  Temple Region: severe depletion Clavicle Bone Region: severe depletion Clavicle and Acromion Bone Region: severe depletion Scapular Bone Region: n/a Dorsal Hand: n/a Patellar Region: n/a Anterior Thigh Region: n/a Posterior Calf Region: n/a  Edema: none  Pt meets criteria for severe MALNUTRITION in the context of chronic illness as evidenced by 12% wt loss x 8 months and severe muscle mass loss.  RD reviewed nursing home records, pt was on a Regular diet PTA with no oral nutrition supplements ordered.   Height: Ht  Readings from Last 1 Encounters:  08/07/13 4\' 8"  (1.422 m)    Weight: Wt Readings from Last 1 Encounters:  08/07/13 92 lb (41.731 kg)    Ideal Body Weight: 94 lb  % Ideal Body Weight: 98%  Wt Readings from Last 10 Encounters:  08/07/13 92 lb (41.731 kg)  08/07/13 92 lb (41.731 kg)  08/07/13 92 lb (41.731 kg)  07/10/13 86 lb (39.009 kg)  03/24/13 101 lb (45.813 kg)  12/15/12 105 lb 12.8 oz (47.991 kg)    Usual Body Weight: 105 lb  % Usual Body Weight: 88%  BMI:  Body mass index is 20.64 kg/(m^2). WNL  Estimated Nutritional Needs: Kcal: 1250 - 1400 Protein: 45 - 55 g Fluid: 1.2 - 1.5 liters  Skin: intact  Diet Order:   NPO  EDUCATION NEEDS: -No education needs identified at this time   Intake/Output Summary (Last 24 hours) at 08/08/13 1024 Last data filed at 08/08/13 0454  Gross per 24 hour  Intake   1000 ml  Output    150 ml  Net    850 ml    Last BM: 11/10  Labs:   Recent Labs Lab 08/07/13 2149  NA 141  K 3.4*  CL 106  CO2 26  BUN 17  CREATININE 0.77  CALCIUM 9.0  GLUCOSE 131*    CBG (last 3)  No results found for this basename: GLUCAP,  in the last 72 hours  Scheduled Meds: . clonazePAM  1 mg Oral QHS  . tiZANidine  2 mg Oral BID  . triamcinolone cream  1 application Topical BID    Continuous Infusions: . dextrose 5 % and 0.45 % NaCl with KCl 20 mEq/L 75 mL/hr  at 08/08/13 0226    Past Medical History  Diagnosis Date  . COPD (chronic obstructive pulmonary disease)   . Hypertension   . Oral aphthae   . Orthostatic hypotension   . Senile osteoporosis   . Atrophy of vulva   . Unspecified urinary incontinence   . Urinary frequency   . Unspecified vitamin D deficiency   . Anxiety state, unspecified   . Restless legs syndrome (RLS)   . Unspecified disorder of kidney and ureter   . Atrioventricular block, unspecified   . Closed dislocation of shoulder, unspecified site   . Anemia, unspecified   . Altered mental status   .  Lumbago   . Transient disorder of initiating or maintaining sleep   . Cellulitis and abscess of unspecified site   . Sebaceous cyst   . Insomnia, unspecified   . Dementia in conditions classified elsewhere without behavioral disturbance(294.10)   . Depressive disorder, not elsewhere classified   . Abnormality of gait   . Adult failure to thrive   . Headache(784.0)   . Type II or unspecified type diabetes mellitus without mention of complication, not stated as uncontrolled   . Gout, unspecified   . Extrinsic asthma, unspecified   . Chronic kidney disease, stage I   . Osteoarthrosis, unspecified whether generalized or localized, unspecified site   . Spinal stenosis, unspecified region other than cervical   . Edema   . Tachycardia, unspecified     Past Surgical History  Procedure Laterality Date  . Hip surgery      (R) hip replacement  . Cesarean section    . Abdominal hysterectomy      partial  . Back surgery      fusion  . Breast surgery      bilateral breast reduction  . Eye surgery      bilateral cataract  . Basal cell carcinoma excision    . Basal cell carcinoma excision      face    Jarold Motto MS, RD, LDN Pager: [phone] After-hours pager: [phone]

## 2013-08-08 NOTE — Anesthesia Postprocedure Evaluation (Signed)
Anesthesia Post Note  Patient: [name]  Procedure(s) Performed: Procedure(s) (LRB): ATTEMPTED CLOSED MANIPULATION HIP (Right)  Anesthesia type: general  Patient location: PACU  Post pain: Pain level controlled  Post assessment: Patient's Cardiovascular Status Stable  Last Vitals:  Filed Vitals:   08/08/13 0039  BP: 175/76  Pulse: 51  Temp:   Resp: 22    Post vital signs: Reviewed and stable  Level of consciousness: sedated  Complications: No apparent anesthesia complications

## 2013-08-08 NOTE — Anesthesia Postprocedure Evaluation (Signed)
  Anesthesia Post-op Note  Patient: [name]  Procedure(s) Performed: Procedure(s): TOTAL HIP REVISION- right (Right)  Patient Location: PACU  Anesthesia Type:General  Level of Consciousness: awake and lethargic  Airway and Oxygen Therapy: Patient Spontanous Breathing  Post-op Pain: mild  Post-op Assessment: Post-op Vital signs reviewed  Post-op Vital Signs: stable  Complications: No apparent anesthesia complications

## 2013-08-08 NOTE — Anesthesia Procedure Notes (Signed)
Procedure Name: Intubation Date/Time: 08/08/2013 3:08 PM Performed by: Charm Barges, Antwane Grose R Pre-anesthesia Checklist: Patient identified, Emergency Drugs available, Suction available, Patient being monitored and Timeout performed Patient Re-evaluated:Patient Re-evaluated prior to inductionOxygen Delivery Method: Circle system utilized Preoxygenation: Pre-oxygenation with 100% oxygen Intubation Type: IV induction Ventilation: Mask ventilation without difficulty Laryngoscope Size: Mac and 3 Grade View: Grade III Tube type: Oral Number of attempts: 2 (First attempt into esophagus, immediately recognized and ETT removed, AOI on second attempt) Airway Equipment and Method: Stylet Placement Confirmation: ETT inserted through vocal cords under direct vision,  positive ETCO2 and breath sounds checked- equal and bilateral Secured at: 20 cm Tube secured with: Tape Dental Injury: Teeth and Oropharynx as per pre-operative assessment

## 2013-08-08 NOTE — Interval H&P Note (Signed)
History and Physical Interval Note:  08/08/2013 2:14 PM  [name]  has presented today for surgery, with the diagnosis of Right Hip Fx  The various methods of treatment have been discussed with the patient and family. After consideration of risks, benefits and other options for treatment, the patient has consented to  Procedure(s): TOTAL HIP REVISION (Right) as a surgical intervention .  The patient's history has been reviewed, patient examined, no change in status, stable for surgery.  I have reviewed the patient's chart and labs.  Questions were answered to the patient's satisfaction.     Marea Reasner G

## 2013-08-08 NOTE — Progress Notes (Signed)
[name] well known to me. About 4 years from [facility] which has come out of place probably 4 times always with deep flexion generally pulling up her socks.  Recently moved to nursing home and last night pulling up socks it came out again.  Unable to reduce in OR.  Xrays show good version and position of components.  Plan on revision with a constrained liner today.  Discussed with daughter, [name].

## 2013-08-08 NOTE — H&P (Signed)
ORTHOPAEDIC CONSULTATION HISTORY & PHYSICAL REQUESTING PHYSICIAN: No att. providers found  Chief Complaint: Dislocated right total hip arthroplasty  HPI: [name] is a 79 y.o. female who is status post right total hip replacement in 2010, having undergone 4-5 dislocations previously, the last in December, 2013. Apparently all of these have been reduced closed, either in the ER or OR.  Past Medical History  Diagnosis Date  . COPD (chronic obstructive pulmonary disease)   . Hypertension   . Oral aphthae   . Orthostatic hypotension   . Senile osteoporosis   . Atrophy of vulva   . Unspecified urinary incontinence   . Urinary frequency   . Unspecified vitamin D deficiency   . Anxiety state, unspecified   . Restless legs syndrome (RLS)   . Unspecified disorder of kidney and ureter   . Atrioventricular block, unspecified   . Closed dislocation of shoulder, unspecified site   . Anemia, unspecified   . Altered mental status   . Lumbago   . Transient disorder of initiating or maintaining sleep   . Cellulitis and abscess of unspecified site   . Sebaceous cyst   . Insomnia, unspecified   . Dementia in conditions classified elsewhere without behavioral disturbance(294.10)   . Depressive disorder, not elsewhere classified   . Abnormality of gait   . Adult failure to thrive   . Headache(784.0)   . Type II or unspecified type diabetes mellitus without mention of complication, not stated as uncontrolled   . Gout, unspecified   . Extrinsic asthma, unspecified   . Chronic kidney disease, stage I   . Osteoarthrosis, unspecified whether generalized or localized, unspecified site   . Spinal stenosis, unspecified region other than cervical   . Edema   . Tachycardia, unspecified    Past Surgical History  Procedure Laterality Date  . Hip surgery      (R) hip replacement  . Cesarean section    . Abdominal hysterectomy      partial  . Back surgery      fusion  . Breast surgery       bilateral breast reduction  . Eye surgery      bilateral cataract  . Basal cell carcinoma excision    . Basal cell carcinoma excision      face   History   Social History  . Marital Status: Widowed    Spouse Name: N/A    Number of Children: N/A  . Years of Education: N/A   Social History Main Topics  . Smoking status: Never Smoker   . Smokeless tobacco: None  . Alcohol Use: No  . Drug Use: No  . Sexual Activity: None   Other Topics Concern  . None   Social History Narrative  . None   Family History  Problem Relation Age of Onset  . Diabetes Mother   . Diabetes Father   . Stroke Sister   . Heart attack Brother    Allergies  Allergen Reactions  . Sulfa Antibiotics Shortness Of Breath    Worsens asthma  . Clonidine Derivatives   . Lactose Intolerance (Gi)    Prior to Admission medications   Medication Sig Start Date End Date Taking? Authorizing Provider  clonazePAM (KLONOPIN) 1 MG tablet Take 1 mg by mouth at bedtime.    Yes Historical Provider, MD  HYDROcodone-acetaminophen (NORCO/VICODIN) 5-325 MG per tablet Take 1 tablet by mouth every 6 (six) hours as needed for pain.   Yes Historical Provider, MD  tiZANidine (ZANAFLEX) 2 MG tablet Take 2 mg by mouth 2 (two) times daily.   Yes Historical Provider, MD  triamcinolone cream (KENALOG) 0.5 % Apply 1 application topically 2 (two) times daily. To rash on legs 12/15/12  Yes Tiffany L Reed, DO   Dg Hip Complete Right  08/07/2013   CLINICAL DATA:  Hip pain.  EXAM: RIGHT HIP - COMPLETE 2+ VIEW  COMPARISON:  09/24/2012  FINDINGS: Again noted is superior dislocation of the right hip replacement, similar to prior study. No fracture. Diffuse osteopenia. Mild degenerative changes in the left hip.  IMPRESSION: Superior dislocation of the right hip replacement.   Electronically Signed   By: Charlett Nose M.D.   On: 08/07/2013 20:59    Positive ROS: All other systems have been reviewed and were otherwise negative with the  exception of those mentioned in the HPI and as above.  Physical Exam: Vitals: Refer to EMR. Constitutional:  WD, WN, NAD HEENT:  NCAT, EOMI Neuro/Psych:   appropriate mood & affect Lymphatic: No generalized LE edema or lymphadenopathy Extremities / MSK:  The extremities are normal with respect to appearance, ranges of motion, joint stability, muscle strength/tone, sensation, & perfusion except as otherwise noted:   The right leg is shorter than the left, with diminished motion with pain. The hip is held slightly internally rotated and flexed. Long bones appear stable to palpation and motion, no tenderness at the knee.  Assessment: Dislocated right total hip  Plan: Under conscious sedation in the emergency department, closed reduction was unsuccessful. Discussion was held with patient's daughter regarding a plan for attempted closed reduction under general anesthesia in the operating room using fluoroscopic guidance. Consent was obtained.  Cliffton Asters Janee Morn, MD     Mobile [phone] Orthopaedic & Hand Surgery [facility] [address] [place], [place]  [address] [phone]

## 2013-08-08 NOTE — Transfer of Care (Signed)
Immediate Anesthesia Transfer of Care Note  Patient: [name]  Procedure(s) Performed: Procedure(s): ATTEMPTED CLOSED MANIPULATION HIP (Right)  Patient Location: PACU  Anesthesia Type:General  Level of Consciousness: awake, alert  and oriented  Airway & Oxygen Therapy: Patient Spontanous Breathing and Patient connected to nasal cannula oxygen  Post-op Assessment: Report given to PACU RN, Post -op Vital signs reviewed and stable and Patient moving all extremities  Post vital signs: Reviewed and stable  Complications: No apparent anesthesia complications

## 2013-08-08 NOTE — Op Note (Signed)
08/07/2013 - 08/08/2013  12:25 AM  PATIENT:  [name]  79 y.o. female  PRE-OPERATIVE DIAGNOSIS:  Right total hip dislocation  POST-OPERATIVE DIAGNOSIS:  Same  PROCEDURE:  Attempted closed reduction right total hip dislocation  SURGEON: Cliffton Asters. Janee Morn, MD  PHYSICIAN ASSISTANT: None  ANESTHESIA:  general  SPECIMENS:  None  DRAINS:   None  PREOPERATIVE INDICATIONS:  [name] is a  80 y.o. female with a diagnosis of  Dislocated right total hip, failed closed reduction under sedation in the emergency department  The risks benefits and alternatives were discussed with the patient and her daughter preoperatively including but not limited to the risks of fracture, failure to obtain reduction, and cardiopulmonary complications. Consent was obtained.  OPERATIVE IMPLANTS: None  OPERATIVE PROCEDURE:  A surgical "time-out" was performed during which the planned procedure, proposed operative site, and the correct patient identity were compared to the operative consent and agreement confirmed by the circulating nurse according to current facility policy.    After general anesthesia was administered, closed reduction was attempted multiple times using C-arm for guidance. Adequate traction could be established and the hip was brought down to the level of the acetabulum, but remain lateral never fall into the cup. AP and lateral images obtained with the hip held in this position indicated that it should have fallen into the socket, but didn't. Assumption was made that there was a mechanical block to reduction. Patient was awakened and taken to the recovery room.  Further treatment will be directed by Dr. Jerl Santos.

## 2013-08-08 NOTE — Anesthesia Procedure Notes (Signed)
Date/Time: 08/07/2013 11:50 PM Performed by: Luster Landsberg Pre-anesthesia Checklist: Patient identified, Emergency Drugs available, Suction available and Patient being monitored Patient Re-evaluated:Patient Re-evaluated prior to inductionOxygen Delivery Method: Circle system utilized Preoxygenation: Pre-oxygenation with 100% oxygen Ventilation: Mask ventilation without difficulty and Oral airway inserted - appropriate to patient size Comments: Mask ventilation throughout case

## 2013-08-08 NOTE — Transfer of Care (Signed)
Immediate Anesthesia Transfer of Care Note  Patient: [name]  Procedure(s) Performed: Procedure(s): TOTAL HIP REVISION- right (Right)  Patient Location: PACU  Anesthesia Type:General  Level of Consciousness: awake and oriented  Airway & Oxygen Therapy: Patient Spontanous Breathing and Patient connected to nasal cannula oxygen  Post-op Assessment: Report given to PACU RN, Post -op Vital signs reviewed and stable and Patient moving all extremities  Post vital signs: Reviewed and stable  Complications: No apparent anesthesia complications

## 2013-08-08 NOTE — Brief Op Note (Signed)
[name] [id number] 08/08/2013   PRE-OP DIAGNOSIS: dislocated right THR  POST-OP DIAGNOSIS: same  PROCEDURE: right THR revision  ANESTHESIA: GA  [name] G   Dictation #:  [id number]

## 2013-08-08 NOTE — Anesthesia Preprocedure Evaluation (Addendum)
Anesthesia Evaluation  Patient identified by MRN, date of birth, ID band Patient awake    Reviewed: Allergy & Precautions, H&P , NPO status , Patient's Chart, lab work & pertinent test results  Airway Mallampati: II  Neck ROM: full    Dental   Pulmonary asthma , COPD         Cardiovascular hypertension, + dysrhythmias     Neuro/Psych  Headaches, Anxiety Depression    GI/Hepatic   Endo/Other  diabetes, Type 2  Renal/GU      Musculoskeletal   Abdominal   Peds  Hematology   Anesthesia Other Findings Pt is a DNR.  After discussion with patient's daughter, the decision was made to temporarily suspend DNR for periop period.  Reproductive/Obstetrics                          Anesthesia Physical Anesthesia Plan  ASA: III  Anesthesia Plan: General   Post-op Pain Management:    Induction: Intravenous  Airway Management Planned: Oral ETT  Additional Equipment:   Intra-op Plan:   Post-operative Plan: Extubation in OR  Informed Consent: I have reviewed the patients History and Physical, chart, labs and discussed the procedure including the risks, benefits and alternatives for the proposed anesthesia with the patient or authorized representative who has indicated his/her understanding and acceptance.     Plan Discussed with: CRNA, Anesthesiologist and Surgeon  Anesthesia Plan Comments:         Anesthesia Quick Evaluation

## 2013-08-09 ENCOUNTER — Encounter (HOSPITAL_COMMUNITY): Payer: Self-pay | Admitting: Orthopedic Surgery

## 2013-08-09 ENCOUNTER — Inpatient Hospital Stay (HOSPITAL_COMMUNITY): Payer: Medicare Other

## 2013-08-09 DIAGNOSIS — F0280 Dementia in other diseases classified elsewhere without behavioral disturbance: Secondary | ICD-10-CM

## 2013-08-09 DIAGNOSIS — R627 Adult failure to thrive: Secondary | ICD-10-CM

## 2013-08-09 DIAGNOSIS — R05 Cough: Secondary | ICD-10-CM | POA: Diagnosis present

## 2013-08-09 LAB — BASIC METABOLIC PANEL
Calcium: 7.9 mg/dL — ABNORMAL LOW (ref 8.4–10.5)
Chloride: 108 mEq/L (ref 96–112)
GFR calc non Af Amer: 71 mL/min — ABNORMAL LOW (ref >90)
Potassium: 3.9 mEq/L (ref 3.5–5.1)
Sodium: 139 mEq/L (ref 135–145)

## 2013-08-09 LAB — PRO B NATRIURETIC PEPTIDE: Pro B Natriuretic peptide (BNP): 22853 pg/mL — ABNORMAL HIGH (ref 0–450)

## 2013-08-09 LAB — CBC
HCT: 32.3 % — ABNORMAL LOW (ref 36.0–46.0)
MCH: 29 pg (ref 26.0–34.0)
MCHC: 31.6 g/dL (ref 30.0–36.0)
Platelets: 175 10*3/uL (ref 150–400)
WBC: 6.3 10*3/uL (ref 4.0–10.5)

## 2013-08-09 MED ORDER — FUROSEMIDE 10 MG/ML IJ SOLN
20.0000 mg | Freq: Once | INTRAMUSCULAR | Status: AC
  Administered 2013-08-09: 20 mg via INTRAVENOUS
  Filled 2013-08-09: qty 2

## 2013-08-09 MED ORDER — SODIUM CHLORIDE 0.9 % IV BOLUS (SEPSIS)
250.0000 mL | Freq: Once | INTRAVENOUS | Status: AC
  Administered 2013-08-09: 250 mL via INTRAVENOUS

## 2013-08-09 MED ORDER — POTASSIUM CHLORIDE CRYS ER 20 MEQ PO TBCR
20.0000 meq | EXTENDED_RELEASE_TABLET | Freq: Two times a day (BID) | ORAL | Status: DC
  Administered 2013-08-09: 20 meq via ORAL
  Filled 2013-08-09 (×3): qty 1

## 2013-08-09 NOTE — Progress Notes (Signed)
Chaplain's Note: pt was in bed using oxygen, opened eyes and stated, "I don't feel so good"; pt closed her eyes went back to sleep.  Chaplain provided prayer of comfort and peace for pt.  Willa S. Manson Passey, M.Div., [facility]

## 2013-08-09 NOTE — Progress Notes (Signed)
[facility] LCSW NOTE Patient was up in her chair, complaining of pain and requesting to go back to bed. The nurse, Koleen Nimrod, came in to give patient her medication. Patient was verbal, but seemed to have a bit of confusion. She thought her daughter and grandchildren were just behind the closet door. No family was at the bedside. SW left a message for her daughter [name]. This is NOT a HOSPICE RELATED admission. Team to follow to provide education and support patient and family..  Please call [phone] with any questions, concerns or changes in patient's condition.  [name] Lonon,LCSW [phone])

## 2013-08-09 NOTE — Evaluation (Signed)
Physical Therapy Evaluation Patient Details Name: [name] MRN: [id number] DOB: 1916-03-18 Today's Date: 08/09/2013 Time: 0735-0800 PT Time Calculation (min): 25 min  PT Assessment / Plan / Recommendation History of Present Illness  Pt is s/p R total hip revision.  Clinical Impression  Pt with decreased strength and mobility, currently unable to gait and requires max A for all transfers.  Pt will benefit from skilled PT to address deficits and decrease burden of care.  Will benefit from SNF after acute care stay to continue to progress mobility.    PT Assessment  Patient needs continued PT services    Follow Up Recommendations  SNF    Does the patient have the potential to tolerate intense rehabilitation      Barriers to Discharge        Equipment Recommendations  None recommended by PT    Recommendations for Other Services OT consult   Frequency Min 5X/week    Precautions / Restrictions Precautions Precautions: Anterior Hip;Fall Restrictions RLE Weight Bearing: Weight bearing as tolerated   Pertinent Vitals/Pain Pt c/o 8/10 pain with mobility, RN aware, ice applied      Mobility  Bed Mobility Bed Mobility: Supine to Sit Supine to Sit: 2: Max assist Details for Bed Mobility Assistance: pt with strong posterior lean, requires assist for LEs and trunk due to pain and weakness Transfers Transfers: Sit to Stand;Stand Pivot Transfers;Stand to Sit Sit to Stand: 2: Max assist Stand to Sit: 2: Max assist Stand Pivot Transfers: 2: Max assist Details for Transfer Assistance: Pt requires max A for anterior wt shift, max A with SPT with RW due to decreased wt bearing on R LE, impaired sequencing, pt unable to take wt on UEs to decrease LE pain    Exercises     PT Diagnosis: Difficulty walking;Acute pain;Generalized weakness  PT Problem List: Decreased balance;Decreased strength;Decreased mobility;Decreased activity tolerance;Decreased knowledge of use of DME;Pain PT  Treatment Interventions: DME instruction;Gait training;Therapeutic activities;Functional mobility training;Balance training;Patient/family education;Therapeutic exercise;Cognitive remediation;Manual techniques;Neuromuscular re-education;Modalities     PT Goals(Current goals can be found in the care plan section) Acute Rehab PT Goals Patient Stated Goal: stop the pain PT Goal Formulation: With patient Time For Goal Achievement: 08/16/13 Potential to Achieve Goals: Good  Visit Information  Last PT Received On: 08/09/13 Assistance Needed: +1 History of Present Illness: Pt is s/p R total hip revision.       Prior Functioning  Home Living Family/patient expects to be discharged to:: Assisted living Home Equipment: Walker - 2 wheels Additional Comments: Pt states she lives alone at a faciltiy Prior Function Level of Independence: Independent with assistive device(s) Comments: used RW at all times    Cognition  Cognition Arousal/Alertness: Awake/alert Behavior During Therapy: WFL for tasks assessed/performed Overall Cognitive Status: No family/caregiver present to determine baseline cognitive functioning    Extremity/Trunk Assessment Lower Extremity Assessment Lower Extremity Assessment: RLE deficits/detail;Generalized weakness RLE Deficits / Details: AROM and PROM limited due to pain RLE Sensation: decreased light touch Cervical / Trunk Assessment Cervical / Trunk Assessment: Kyphotic   Balance Static Sitting Balance Static Sitting - Balance Support: Feet supported Static Sitting - Level of Assistance: 5: Stand by assistance Dynamic Sitting Balance Dynamic Sitting - Balance Support: Feet supported Dynamic Sitting - Level of Assistance: 4: Min assist  End of Session PT - End of Session Equipment Utilized During Treatment: Gait belt Activity Tolerance: Patient limited by pain;Patient limited by fatigue Patient left: in chair;with call bell/phone within reach;with nursing/sitter  in  room Nurse Communication: Mobility status  GP     Ronzell Laban 08/09/2013, 9:27 AM

## 2013-08-09 NOTE — Progress Notes (Signed)
Room 5N 17 - [name] - [facility] RN Visit-[name] RN  NON RELATED and NON COVERED admission.  [facility] diagnosis is depressive disorder.  Pt is DNR code with hospice - noted a full code here on this admission.  Pt is a resident of [facility].   No family present. Patient's home medication list is on shadow chart.   Pt admitted following dislocation of total hip prosthesis 11/10. Pt in surgery at time of visit - having ORIF to reposition prosthesis.  Please call [facility] @ [phone]- ask for RN Liaison or after hours,ask for on-call RN with any hospice needs.   Thank you.  Joneen Boers, RN  [facility]  Hospice Liaison  [phone])

## 2013-08-09 NOTE — Consult Note (Addendum)
[facility] Medical Consultation  [name] YNW:[id number] DOB: Aug 15, 1916 DOA: 08/07/2013 PCP: Bufford Spikes, DO   Requesting physician: Jerl Santos Date of consultation: 08/09/13 Reason for consultation: cough, edema   Impression/Recommendations Principal Problem: cough HTN Dementia    79 y/o female with PMH of mild Alzheimer's (recently worse), severe depression with failure to thrive, asthma, lumbar spinal stenosis with neurogenic claudication, orthostatic hypotension, restless legs syndrome, DMII on diet and prior gout was admitted with hip dislocation, underwent right THR revision 11/11;   1. R hip dislocation; s/p THR 11/11; defer to ortho   2. Cough productive, afebrile, no leukocytosis  -obtain CXR r/o infection; echo, BNP/echo r/o CHF  3. Probable underlying CHF; I/O positive; patient is not in acute respiratory distress; -monitor urine output; prn IVF; obtain echo, BNP, CXR;  4. Failure to thrive, alzheimers dementia -need to resume hospice care; d/c benadryl due to confusion; decrease opioids if possible    5. Anemia; no s/s of acute bleeding; cont monitor   I d/w patient, nurse at the bedside; called the family no response; will try again/need to clearify code status and goals of care   Addendum: d/w her daughter at cell: [phone]; patient was taking some lasix at home, which was d/c few weeks ago; patient is DNR; will f/u echo, CXR, labs; give IV lasix;   I will followup again tomorrow. Please contact me if I can be of assistance in the meanwhile. Thank you for this consultation.  Chief Complaint: hip dislocation   HPI:  79 y/o female with PMH of mild Alzheimer's (recently worse), severe depression with failure to thrive, asthma, lumbar spinal stenosis with neurogenic claudication, orthostatic hypotension, restless legs syndrome, DMII on diet and prior gout was admitted with hip dislocation, underwent right THR revision 11/11;  -hospital ist is  consulted for edema, cough; patient reports intermittent cough mild productive for few days; denies SOB, no chest pain, no dizziness, no palpitations, no nausea, vomiting or diarrhea;  -apparently patient was under hospice care prior to hospitalization;   Review of Systems:  Review of Systems  Constitutional: Positive for weight loss. Negative for fever, chills and diaphoresis.  HENT: Positive for congestion. Negative for ear discharge and sore throat.   Eyes: Negative for blurred vision.  Respiratory: Positive for cough. Negative for shortness of breath, wheezing and stridor.   Cardiovascular: Positive for claudication and leg swelling. Negative for chest pain, palpitations and orthopnea.  Gastrointestinal: Negative for heartburn.  Genitourinary: Negative for dysuria.  Musculoskeletal: Positive for joint pain.  Skin: Negative for itching and rash.  Neurological: Positive for weakness. Negative for dizziness, tingling, sensory change and speech change.  Endo/Heme/Allergies: Negative for polydipsia.  Psychiatric/Behavioral: Negative for suicidal ideas, hallucinations and substance abuse.     Past Medical History  Diagnosis Date  . COPD (chronic obstructive pulmonary disease)   . Hypertension   . Oral aphthae   . Orthostatic hypotension   . Senile osteoporosis   . Atrophy of vulva   . Unspecified urinary incontinence   . Urinary frequency   . Unspecified vitamin D deficiency   . Anxiety state, unspecified   . Restless legs syndrome (RLS)   . Unspecified disorder of kidney and ureter   . Atrioventricular block, unspecified   . Closed dislocation of shoulder, unspecified site   . Anemia, unspecified   . Altered mental status   . Lumbago   . Transient disorder of initiating or maintaining sleep   . Cellulitis and abscess of unspecified site   .  Sebaceous cyst   . Insomnia, unspecified   . Dementia in conditions classified elsewhere without behavioral disturbance(294.10)   .  Depressive disorder, not elsewhere classified   . Abnormality of gait   . Adult failure to thrive   . Headache(784.0)   . Type II or unspecified type diabetes mellitus without mention of complication, not stated as uncontrolled   . Gout, unspecified   . Extrinsic asthma, unspecified   . Chronic kidney disease, stage I   . Osteoarthrosis, unspecified whether generalized or localized, unspecified site   . Spinal stenosis, unspecified region other than cervical   . Edema   . Tachycardia, unspecified    Past Surgical History  Procedure Laterality Date  . Hip surgery      (R) hip replacement  . Cesarean section    . Abdominal hysterectomy      partial  . Back surgery      fusion  . Breast surgery      bilateral breast reduction  . Eye surgery      bilateral cataract  . Basal cell carcinoma excision    . Basal cell carcinoma excision      face   Social History:  reports that she has never smoked. She does not have any smokeless tobacco history on file. She reports that she does not drink alcohol or use illicit drugs.  Allergies  Allergen Reactions  . Sulfa Antibiotics Shortness Of Breath    Worsens asthma  . Clonidine Derivatives   . Lactose Intolerance (Gi)    Family History  Problem Relation Age of Onset  . Diabetes Mother   . Diabetes Father   . Stroke Sister   . Heart attack Brother     Prior to Admission medications   Medication Sig Start Date End Date Taking? Authorizing Provider  clonazePAM (KLONOPIN) 1 MG tablet Take 1 mg by mouth at bedtime.    Yes Historical Provider, MD  HYDROcodone-acetaminophen (NORCO/VICODIN) 5-325 MG per tablet Take 1 tablet by mouth every 6 (six) hours as needed for pain.   Yes Historical Provider, MD  tiZANidine (ZANAFLEX) 2 MG tablet Take 2 mg by mouth 2 (two) times daily.   Yes Historical Provider, MD  triamcinolone cream (KENALOG) 0.5 % Apply 1 application topically 2 (two) times daily. To rash on legs 12/15/12  Yes Kermit Balo, DO    Physical Exam: Blood pressure 138/73, pulse 58, temperature 97.5 F (36.4 C), temperature source Oral, resp. rate 16, height 4\' 8"  (1.422 m), weight 41.731 kg (92 lb), SpO2 96.00%. Filed Vitals:   08/09/13 0526  BP: 138/73  Pulse: 58  Temp: 97.5 F (36.4 C)  Resp: 16     General:  alert  Eyes: EOM-I, perrla   ENT: no oral ulcers   Neck: supple, +JVD  Cardiovascular: S1,s2, diastolic mr; trrr  Respiratory: LL crackles    Abdomen: soft, nt nd   Skin: ecchymosis   Musculoskeletal: LE edema   Psychiatric: non hallucinations   Neurologic: CN 2-12 intact, motor LE limited due to pain   Labs on Admission:  Basic Metabolic Panel:  Recent Labs Lab 08/07/13 2149 08/09/13 0428  NA 141 139  K 3.4* 3.9  CL 106 108  CO2 26 20  GLUCOSE 131* 118*  BUN 17 13  CREATININE 0.77 0.71  CALCIUM 9.0 7.9*   Liver Function Tests: No results found for this basename: AST, ALT, ALKPHOS, BILITOT, PROT, ALBUMIN,  in the last 168 hours No results found for this  basename: LIPASE, AMYLASE,  in the last 168 hours No results found for this basename: AMMONIA,  in the last 168 hours CBC:  Recent Labs Lab 08/07/13 2149 08/09/13 0428  WBC 6.5 6.3  NEUTROABS 4.6  --   HGB 11.6* 10.2*  HCT 34.8* 32.3*  MCV 89.2 91.8  PLT 219 175   Cardiac Enzymes: No results found for this basename: CKTOTAL, CKMB, CKMBINDEX, TROPONINI,  in the last 168 hours BNP: No components found with this basename: POCBNP,  CBG: No results found for this basename: GLUCAP,  in the last 168 hours  Radiological Exams on Admission: Dg Hip 1 View Right  08/08/2013   CLINICAL DATA:  Right hip closed reduction.  EXAM: RIGHT HIP - 1 VIEW  COMPARISON:  Yesterday.  FINDINGS: Three C arm views of the right hip demonstrate the femoral component of the right hip prosthesis overlying various portions of the acetabular component. The femoral component does not appear normally located on any of the views.  IMPRESSION:  Varying positions of the femoral head component of the right hip prosthesis with no images showing a normal relationship with the acetabular component.   Electronically Signed   By: Gordan Payment M.D.   On: 08/08/2013 01:17   Dg Hip Complete Right  08/07/2013   CLINICAL DATA:  Hip pain.  EXAM: RIGHT HIP - COMPLETE 2+ VIEW  COMPARISON:  09/24/2012  FINDINGS: Again noted is superior dislocation of the right hip replacement, similar to prior study. No fracture. Diffuse osteopenia. Mild degenerative changes in the left hip.  IMPRESSION: Superior dislocation of the right hip replacement.   Electronically Signed   By: Charlett Nose M.D.   On: 08/07/2013 20:59   Dg Hip Portable 1 View Right  08/08/2013   CLINICAL DATA:  Status post attempted reduction of a right hip dislocation.  EXAM: PORTABLE RIGHT HIP - 1 VIEW  COMPARISON:  Earlier on the same date.  FINDINGS: Again demonstrated is superior dislocation of the femoral head component of a right total hip prosthesis. No fracture seen. Diffuse osteopenia. Lower lumbar spine fixation hardware and bone fusion. Arterial calcifications.  IMPRESSION: Continued superior dislocation of the femoral component of the right total hip prosthesis.   Electronically Signed   By: Gordan Payment M.D.   On: 08/08/2013 00:40   Dg C-arm 1-60 Min-no Report  08/08/2013   CLINICAL DATA: closed reduction right hip   C-ARM 1-60 MINUTES  Fluoroscopy was utilized by the requesting physician.  No radiographic  interpretation.     EKG: Independently reviewed. NSR, t wave depression, inf/lateral ischemia   Time spent: >35 minutes   Esperanza Sheets [facility] Pager [phone]  If 7PM-7AM, please contact night-coverage [email or web address] Password [email or web address] 08/09/2013, 1:06 PM

## 2013-08-09 NOTE — Progress Notes (Signed)
Pt has had bolus NS along with IV fluids.  Output in F/C has been <157ml of dark amber urine.  Bladder scan showed 81ml (performed twice for accuracy).  (+) JVD on left side, no pitting edema bilat LE.  MD aware.  See MAR for new orders.  Pt is resting quietly in bed at this time.

## 2013-08-09 NOTE — Progress Notes (Signed)
Room 5N 17 - [name] - [facility] RN Visit-R.Jamyron Redd RN  NON RELATED and NON COVERED admission.  [facility] diagnosis is depressive disorder.  Pt is DNR code with hospice - noted a full code here on this admission.    Pt alert, sitting up in lounge chair, with complaints of pain in R/hip area at 5/10.  Pt complains of full bladder feeling - knows she has foley catheter, but has had full feeling x 1 hour.  Staff  NT and RN notified.   Pt confused, states her dtr is next door and that she lives with her dtr - pt lives in  [facility].   No family present. Patient's home medication list is on shadow chart.   Pt admitted following dislocation of total hip prosthesis 11/10.  Pt states she bent over and turned her foot out and prosthesis popped out of place.  Pt underwent ORIF R/THR to reposition femoral component.  Please call [facility] @ [phone]- ask for RN Liaison or after hours,ask for on-call RN with any hospice needs.   Thank you.  Joneen Boers, RN  [facility]  Hospice Liaison  [phone])

## 2013-08-09 NOTE — Progress Notes (Signed)
Pt's daughter is at bedside at this time.  Gave her MD pager number so she may be updated for goal/plan.

## 2013-08-09 NOTE — Progress Notes (Signed)
Subjective: 1 Day Post-Op Procedure(s) (LRB): TOTAL HIP REVISION- right (Right) Low urine output . Continue IV and encourage PO's, continue to monitor HgB. Activity level:  WBAT Diet tolerance:  Light eating  Voiding:  Catheter in to be removed later today if output impriving Patient reports pain as 3 on 0-10 scale.    Objective: Vital signs in last 24 hours: Temp:  [97 F (36.1 C)-98.6 F (37 C)] 97.5 F (36.4 C) (11/12 0526) Pulse Rate:  [42-74] 58 (11/12 0526) Resp:  [16-19] 16 (11/12 0526) BP: (125-156)/(56-73) 138/73 mmHg (11/12 0526) SpO2:  [91 %-100 %] 96 % (11/12 0526)  Labs:  Recent Labs  08/07/13 2149 08/09/13 0428  HGB 11.6* 10.2*    Recent Labs  08/07/13 2149 08/09/13 0428  WBC 6.5 6.3  RBC 3.90 3.52*  HCT 34.8* 32.3*  PLT 219 175    Recent Labs  08/07/13 2149 08/09/13 0428  NA 141 139  K 3.4* 3.9  CL 106 108  CO2 26 20  BUN 17 13  CREATININE 0.77 0.71  GLUCOSE 131* 118*  CALCIUM 9.0 7.9*   No results found for this basename: LABPT, INR,  in the last 72 hours  Physical Exam:  Neurologically intact ABD soft Neurovascular intact Sensation intact distally Intact pulses distally Dorsiflexion/Plantar flexion intact Incision: dressing C/D/I  Assessment/Plan:  1 Day Post-Op Procedure(s) (LRB): TOTAL HIP REVISION- right (Right) Advance diet Up with therapy Continue foley due to urinary output monitoring Continue ASA/SCD's when medically stable will be d/c to SNF WBAT, expect slow progress, cont to monitor urinary output, will recheck hemoglobin tomorrow     [name] R 08/09/2013, 8:02 AM

## 2013-08-10 DIAGNOSIS — J189 Pneumonia, unspecified organism: Secondary | ICD-10-CM

## 2013-08-10 LAB — CBC
MCV: 91.1 fL (ref 78.0–100.0)
Platelets: 210 10*3/uL (ref 150–400)
RDW: 14.7 % (ref 11.5–15.5)
WBC: 8.6 10*3/uL (ref 4.0–10.5)

## 2013-08-10 LAB — BASIC METABOLIC PANEL
CO2: 25 mEq/L (ref 19–32)
Chloride: 106 mEq/L (ref 96–112)
Creatinine, Ser: 0.81 mg/dL (ref 0.50–1.10)
GFR calc Af Amer: 69 mL/min — ABNORMAL LOW (ref >90)
GFR calc non Af Amer: 60 mL/min — ABNORMAL LOW (ref >90)
Sodium: 139 mEq/L (ref 135–145)

## 2013-08-10 MED ORDER — ENSURE COMPLETE PO LIQD
237.0000 mL | Freq: Two times a day (BID) | ORAL | Status: DC
  Administered 2013-08-10 – 2013-08-13 (×5): 237 mL via ORAL

## 2013-08-10 MED ORDER — POTASSIUM CHLORIDE CRYS ER 10 MEQ PO TBCR
10.0000 meq | EXTENDED_RELEASE_TABLET | Freq: Once | ORAL | Status: AC
  Administered 2013-08-10: 10 meq via ORAL
  Filled 2013-08-10: qty 1

## 2013-08-10 MED ORDER — DEXTROSE 5 % IV SOLN
500.0000 mg | INTRAVENOUS | Status: DC
  Administered 2013-08-10 – 2013-08-13 (×4): 500 mg via INTRAVENOUS
  Filled 2013-08-10 (×5): qty 0.5

## 2013-08-10 MED ORDER — FUROSEMIDE 20 MG PO TABS
10.0000 mg | ORAL_TABLET | Freq: Once | ORAL | Status: AC
  Administered 2013-08-10: 10 mg via ORAL
  Filled 2013-08-10: qty 0.5

## 2013-08-10 MED ORDER — VANCOMYCIN HCL 500 MG IV SOLR
500.0000 mg | INTRAVENOUS | Status: DC
  Administered 2013-08-10 – 2013-08-12 (×3): 500 mg via INTRAVENOUS
  Filled 2013-08-10 (×4): qty 500

## 2013-08-10 MED ORDER — MORPHINE SULFATE 2 MG/ML IJ SOLN
1.0000 mg | INTRAMUSCULAR | Status: DC | PRN
  Filled 2013-08-10: qty 1

## 2013-08-10 NOTE — Progress Notes (Signed)
NUTRITION FOLLOW UP  DOCUMENTATION CODES  Per approved criteria  -Severe malnutrition in the context of chronic illness    Intervention:   Ensure Complete po BID, each supplement provides 350 kcal and 13 grams of protein.  Nutrition Dx:   Increased nutrient needs related to post-op heailng as evidenced by estimated needs; ongoing.   Goal:  Intake to meet >90% of estimated nutrition needs, not met.  Monitor:   PO intake, supplement acceptance, weight trend, labs   Assessment:   PMHx significant for COPD, anxiety, dementia, depression, adult FTT, and DM. Patient is s/p R total hip replacement in 2010, having undergone 4-5 dislocations previously, the last in December, 2013. Admitted with dislocated R total hip arthroplasty. Pt is under the care of Hospice due to her severe alzheimer's dementia.  Plan for pt to d/c to SNF. Pt eating some, will order ensure. Pt unable to answer any questions.     Height: Ht Readings from Last 1 Encounters:  08/07/13 4\' 8"  (1.422 m)    Weight Status:   Wt Readings from Last 1 Encounters:  08/07/13 92 lb (41.731 kg)  No new weight  Re-estimated needs:  Kcal: 1250 - 1400  Protein: 45 - 55 g  Fluid: 1.2 - 1.5 liters  Skin: left hip incision   Diet Order: General Meal Completion: 0-50%   Intake/Output Summary (Last 24 hours) at 08/10/13 1151 Last data filed at 08/10/13 1100  Gross per 24 hour  Intake    525 ml  Output   1575 ml  Net  -1050 ml    Last BM: 11/10   Labs:   Recent Labs Lab 08/07/13 2149 08/09/13 0428 08/10/13 0725  NA 141 139 139  K 3.4* 3.9 4.2  CL 106 108 106  CO2 26 20 25   BUN 17 13 15   CREATININE 0.77 0.71 0.81  CALCIUM 9.0 7.9* 8.5  GLUCOSE 131* 118* 93    CBG (last 3)  No results found for this basename: GLUCAP,  in the last 72 hours  Scheduled Meds: . aspirin EC  325 mg Oral BID PC  . ceFEPime (MAXIPIME) IV  500 mg Intravenous Q24H  . clonazePAM  1 mg Oral QHS  . docusate sodium  100 mg Oral  BID  . ferrous sulfate  325 mg Oral TID PC  . tiZANidine  2 mg Oral BID  . triamcinolone cream  1 application Topical BID  . vancomycin  500 mg Intravenous Q24H    Continuous Infusions:  Kendell Bane RD, LDN, CNSC [phone] Pager [phone] After Hours Pager

## 2013-08-10 NOTE — Progress Notes (Signed)
  Echocardiogram 2D Echocardiogram has been performed.  Arvil Chaco 08/10/2013, 2:44 PM

## 2013-08-10 NOTE — Progress Notes (Signed)
Subjective: 2 Days Post-Op Procedure(s) (LRB): TOTAL HIP REVISION- right (Right) Feeling much better today. Answering questions appropriately. Medical consult suspects early pneumonia and have started her on antibiotics. Pain level is tolerable. Activity level:  May be out of bed weightbearing as tolerated with maximal assist. Diet tolerance:  Light eating Voiding:  Catheter out Patient reports pain as 3 on 0-10 scale.    Objective: Vital signs in last 24 hours: Temp:  [97.1 F (36.2 C)-99.8 F (37.7 C)] 98.3 F (36.8 C) (11/13 0529) Pulse Rate:  [61-88] 61 (11/13 0529) Resp:  [16] 16 (11/13 0529) BP: (128-146)/(62-84) 136/68 mmHg (11/13 0529) SpO2:  [95 %-100 %] 100 % (11/13 0529)  Labs:  Recent Labs  08/07/13 2149 08/09/13 0428 08/10/13 0725  HGB 11.6* 10.2* 11.1*    Recent Labs  08/09/13 0428 08/10/13 0725  WBC 6.3 8.6  RBC 3.52* 3.80*  HCT 32.3* 34.6*  PLT 175 210    Recent Labs  08/09/13 0428 08/10/13 0725  NA 139 139  K 3.9 4.2  CL 108 106  CO2 20 25  BUN 13 15  CREATININE 0.71 0.81  GLUCOSE 118* 93  CALCIUM 7.9* 8.5   No results found for this basename: LABPT, INR,  in the last 72 hours  Physical Exam:  Neurologically intact ABD soft Neurovascular intact Sensation intact distally Intact pulses distally Dorsiflexion/Plantar flexion intact No cellulitis present Compartment soft  Assessment/Plan:  2 Days Post-Op Procedure(s) (LRB): TOTAL HIP REVISION- right (Right) Medical consult started IV antibiotics suspected pneumonia. Really appreciate their help. Pain management with very little pain medication. [name] seems more alert and oriented today. CBC including WBCs and hemoglobin stable. May be out of bed weightbearing as tolerated with maximal assist. Will need skilled nursing facility placement on discharge when medically appropriate per med consult team. I have alerted [name] a medical social worker for this need. Continue ASA/SCDs  for DVT prophylaxis. We'll discharge to SNF per medical team when appropriate.    [name] R 08/10/2013, 10:52 AM

## 2013-08-10 NOTE — Progress Notes (Addendum)
Room MC-5N17 - Cleon Dew - [facility] RN Visit-R.Murial Beam RN  NON RELATED, NON COVERED ADMISSION.  [facility] diagnosis Depressive Disorder.   Pt is DNR code.    Pt alert, confused, happily eating a [employer] donut supplied by staff RN.  Pt remains confused, complaining of pain in R/hip w/movement at 5/10 otherwise, no pain in hip.  No family present.  Patient's home medication list is on shadow chart.   Discussed with staff RN who states pt is being started on Cephepime and Vanc to rule out PNA.  RN states pt very confused, calling for RN's constantly stating they are supposed to be with her "all the time".   Pt refusing breakfast except for donut and coffee.  Per Ortho consult, pt may DC to SNF as recommended by PT when deemed stable by Medical Attending.   Please call [facility] @ [phone]- ask for RN Liaison or after hours,ask for on-call RN with any hospice needs.   Thank you.  Joneen Boers, RN  [facility]  Hospice Liaison  [phone])

## 2013-08-10 NOTE — Progress Notes (Signed)
ANTIBIOTIC CONSULT NOTE - INITIAL  Pharmacy Consult for vancomycin and cefepime Indication: rule out pneumonia  Labs:  Recent Labs  08/07/13 2149 08/09/13 0428  WBC 6.5 6.3  HGB 11.6* 10.2*  PLT 219 175  CREATININE 0.77 0.71   Estimated Creatinine Clearance: 24.1 ml/min (by C-G formula based on Cr of 0.71).   Microbiology: Recent Results (from the past 720 hour(s))  SURGICAL PCR SCREEN     Status: None   Collection Time    08/08/13 11:48 AM      Result Value Range Status   MRSA, PCR NEGATIVE  NEGATIVE Final   Staphylococcus aureus NEGATIVE  NEGATIVE Final   Comment:            The Xpert SA Assay (FDA     approved for NASAL specimens     in patients over 74 years of age),     is one component of     a comprehensive surveillance     program.  Test performance has     been validated by [employer] for patients greater     than or equal to 58 year old.     It is not intended     to diagnose infection nor to     guide or monitor treatment.    Medical History: Past Medical History  Diagnosis Date  . COPD (chronic obstructive pulmonary disease)   . Hypertension   . Oral aphthae   . Orthostatic hypotension   . Senile osteoporosis   . Atrophy of vulva   . Unspecified urinary incontinence   . Urinary frequency   . Unspecified vitamin D deficiency   . Anxiety state, unspecified   . Restless legs syndrome (RLS)   . Unspecified disorder of kidney and ureter   . Atrioventricular block, unspecified   . Closed dislocation of shoulder, unspecified site   . Anemia, unspecified   . Altered mental status   . Lumbago   . Transient disorder of initiating or maintaining sleep   . Cellulitis and abscess of unspecified site   . Sebaceous cyst   . Insomnia, unspecified   . Dementia in conditions classified elsewhere without behavioral disturbance(294.10)   . Depressive disorder, not elsewhere classified   . Abnormality of gait   . Adult failure to thrive   .  Headache(784.0)   . Type II or unspecified type diabetes mellitus without mention of complication, not stated as uncontrolled   . Gout, unspecified   . Extrinsic asthma, unspecified   . Chronic kidney disease, stage I   . Osteoarthrosis, unspecified whether generalized or localized, unspecified site   . Spinal stenosis, unspecified region other than cervical   . Edema   . Tachycardia, unspecified     Medications:  Prescriptions prior to admission  Medication Sig Dispense Refill  . clonazePAM (KLONOPIN) 1 MG tablet Take 1 mg by mouth at bedtime.       Marland Kitchen HYDROcodone-acetaminophen (NORCO/VICODIN) 5-325 MG per tablet Take 1 tablet by mouth every 6 (six) hours as needed for pain.      Marland Kitchen tiZANidine (ZANAFLEX) 2 MG tablet Take 2 mg by mouth 2 (two) times daily.      Marland Kitchen triamcinolone cream (KENALOG) 0.5 % Apply 1 application topically 2 (two) times daily. To rash on legs  30 g  3   Scheduled:  . aspirin EC  325 mg Oral BID PC  . ceFEPime (MAXIPIME) IV  500 mg Intravenous Q24H  .  clonazePAM  1 mg Oral QHS  . docusate sodium  100 mg Oral BID  . ferrous sulfate  325 mg Oral TID PC  . tiZANidine  2 mg Oral BID  . triamcinolone cream  1 application Topical BID  . vancomycin  500 mg Intravenous Q24H    Assessment: 79yo female admitted 2d ago for closed reduction of dislocated hip under general anesthesia, now w/ patchy infiltrate in right lung, to begin IV ABX.  Goal of Therapy:  Vancomycin trough level 15-20 mcg/ml  Plan:  Will begin vancomycin 500mg  IV Q24H and cefepime 500mg  IV Q24H and monitor CBC, Cx, levels prn.  Vernard Gambles, PharmD, BCPS  08/10/2013,7:45 AM

## 2013-08-10 NOTE — Progress Notes (Signed)
[facility] PROGRESS NOTE  ALICIA SEIB ZOX:[id number] DOB: September 27, 1916 DOA: 08/07/2013 PCP: Bufford Spikes, DO  Assessment/Plan:  1-PNA: Patient with cough, chest x ray with : Patchy infiltrate within the right lung with left lower lobe consolidation and associated effusion. I have started Vancomycin and cefepime to cover for PNA. She will need repeat chest x ray.   2- R hip dislocation; s/p THR 11/11; per  ortho   3-Failure to thrive, alzheimers dementia  -stable.   4- Anemia; no s/s of acute bleeding; cont monitor. Hb stable.   5-Hypoxemia: Acute respiratory failure; multifactorial secondary to pna plus component pulmonary edema, HF. Suspect diastolic dysfunction. Patient with JVD and lung crackles on exam. Increase BNP. ECHO pending. Will give another dose of lasix 10 mg. Strict I and O.   Thank you for the consult. Will follow along with you.   Code Status: DNR Family Communication:  Disposition Plan: SNF when stable.    Procedures: right THR revision   Antibiotics:  Vancomycin 11-13  Cefepime 11-13  HPI/Subjective: Alert , awake. following command. Denies dyspnea.   Objective: Filed Vitals:   08/10/13 0529  BP: 136/68  Pulse: 61  Temp: 98.3 F (36.8 C)  Resp: 16    Intake/Output Summary (Last 24 hours) at 08/10/13 1304 Last data filed at 08/10/13 1100  Gross per 24 hour  Intake    450 ml  Output   1575 ml  Net  -1125 ml   Filed Weights   08/07/13 2012  Weight: 41.731 kg (92 lb)    Exam:   General:  No acute distress.   Cardiovascular: S 1, S 2 RRR  Respiratory: Bilateral crackles, no wheeze.   Abdomen: BS present, soft, nt  Musculoskeletal: trace edema.   Data Reviewed: Basic Metabolic Panel:  Recent Labs Lab 08/07/13 2149 08/09/13 0428 08/10/13 0725  NA 141 139 139  K 3.4* 3.9 4.2  CL 106 108 106  CO2 26 20 25   GLUCOSE 131* 118* 93  BUN 17 13 15   CREATININE 0.77 0.71 0.81  CALCIUM 9.0 7.9* 8.5   Liver Function  Tests: No results found for this basename: AST, ALT, ALKPHOS, BILITOT, PROT, ALBUMIN,  in the last 168 hours No results found for this basename: LIPASE, AMYLASE,  in the last 168 hours No results found for this basename: AMMONIA,  in the last 168 hours CBC:  Recent Labs Lab 08/07/13 2149 08/09/13 0428 08/10/13 0725  WBC 6.5 6.3 8.6  NEUTROABS 4.6  --   --   HGB 11.6* 10.2* 11.1*  HCT 34.8* 32.3* 34.6*  MCV 89.2 91.8 91.1  PLT 219 175 210   Cardiac Enzymes: No results found for this basename: CKTOTAL, CKMB, CKMBINDEX, TROPONINI,  in the last 168 hours BNP (last 3 results)  Recent Labs  08/09/13 0430  PROBNP 22853.0*   CBG: No results found for this basename: GLUCAP,  in the last 168 hours  Recent Results (from the past 240 hour(s))  SURGICAL PCR SCREEN     Status: None   Collection Time    08/08/13 11:48 AM      Result Value Range Status   MRSA, PCR NEGATIVE  NEGATIVE Final   Staphylococcus aureus NEGATIVE  NEGATIVE Final   Comment:            The Xpert SA Assay (FDA     approved for NASAL specimens     in patients over 69 years of age),     is one component  of     a comprehensive surveillance     program.  Test performance has     been validated by [facility] for patients greater     than or equal to 19 year old.     It is not intended     to diagnose infection nor to     guide or monitor treatment.     Studies: Dg Chest 1 View  08/09/2013   CLINICAL DATA:  Cough  EXAM: CHEST - 1 VIEW  COMPARISON:  11/03/2012  FINDINGS: Cardiac shadow remains enlarged. Left basilar consolidation with associated effusion is now seen. Patchy infiltrative changes are noted in the mid and lower right lung as well. No acute bony abnormality is seen.  IMPRESSION: Patchy infiltrate within the right lung with left lower lobe consolidation and associated effusion. Followup films are recommended.   Electronically Signed   By: Alcide Clever M.D.   On: 08/09/2013 17:47    Scheduled  Meds: . aspirin EC  325 mg Oral BID PC  . ceFEPime (MAXIPIME) IV  500 mg Intravenous Q24H  . clonazePAM  1 mg Oral QHS  . docusate sodium  100 mg Oral BID  . feeding supplement (ENSURE COMPLETE)  237 mL Oral BID BM  . ferrous sulfate  325 mg Oral TID PC  . tiZANidine  2 mg Oral BID  . triamcinolone cream  1 application Topical BID  . vancomycin  500 mg Intravenous Q24H   Continuous Infusions:   Principal Problem:   Failed total hip arthroplasty with dislocation Active Problems:   Alzheimer's disease   HYPERTENSION   ABNORMAL EKG   Cough    Time spent: 35 minutes.     Heston Widener  [facility] Pager [phone]. If 7PM-7AM, please contact night-coverage at [email or web address], password [facility] 08/10/2013, 1:04 PM  LOS: 3 days

## 2013-08-10 NOTE — Progress Notes (Signed)
OT Cancellation Note and Discharge  Patient Details Name: CHINAZA ROOKE MRN: [id number] DOB: 1916-07-27   Cancelled Treatment:    Reason Eval/Treat Not Completed: Other (comment). Plan is for pt to go to SNF, will defer to that facility as they deem appropriate since pt is Medicare, has dementia, confusion, was at ALF PTA, and under Hospice care. Acute OT will sign off.  Evette Georges [phone] 08/10/2013, 10:15 AM

## 2013-08-10 NOTE — Progress Notes (Signed)
Physical Therapy Treatment Patient Details Name: [name] MRN: [id number] DOB: Aug 27, 1916 Today's Date: 08/10/2013 Time: 1440-1506 PT Time Calculation (min): 26 min  PT Assessment / Plan / Recommendation  History of Present Illness Pt is s/p R total hip revision.   PT Comments   Pt was very cooperative and had an awareness of situation. Pt was able to participate with mobility when given a lot of visual and tactile cues. Pt ambulated a couple of steps today with +2 assist using a RW. Agree with d/c plans and recommend continued PT in the acute setting in preparation for the next venue of care.  Follow Up Recommendations  SNF     Does the patient have the potential to tolerate intense rehabilitation     Barriers to Discharge        Equipment Recommendations  None recommended by PT    Recommendations for Other Services    Frequency     Progress towards PT Goals Progress towards PT goals: Progressing toward goals (slowly)  Plan Current plan remains appropriate    Precautions / Restrictions Precautions Precautions: Anterior Hip;Fall Restrictions RLE Weight Bearing: Weight bearing as tolerated   Pertinent Vitals/Pain     Mobility  Bed Mobility Bed Mobility: Supine to Sit Supine to Sit: 1: +2 Total assist Supine to Sit: Patient Percentage: 30% Details for Bed Mobility Assistance: max cues for technique, encouraged pt to assist more with bed mobility she was ablt to when given tactile cues and increased time. Transfers Transfers: Sit to Stand;Stand to Sit Sit to Stand: 1: +2 Total assist;With upper extremity assist Sit to Stand: Patient Percentage: 40% Stand to Sit: 1: +2 Total assist;With upper extremity assist Stand to Sit: Patient Percentage: 40% Details for Transfer Assistance: cues for upright posture and use of hands on walker for increased support, cues for sequencing Ambulation/Gait Ambulation/Gait Assistance: 1: +2 Total assist Ambulation/Gait: Patient  Percentage: 40% Ambulation Distance (Feet): 5 Feet Assistive device: Rolling walker Ambulation/Gait Assistance Details: cues for sequencing and walker placement, decreased weight shift on R LE Gait Pattern: Step-to pattern;Decreased stride length;Decreased stance time - right;Decreased step length - left Gait velocity: decreased Stairs: No    Exercises Total Joint Exercises Ankle Circles/Pumps: AROM;Strengthening;Both;10 reps;Supine Heel Slides: AAROM;Strengthening;Left;10 reps;Supine Hip ABduction/ADduction: AAROM;Strengthening;Left;10 reps;Supine Long Arc Quad: AAROM;Strengthening;Left;10 reps;Seated   PT Diagnosis:    PT Problem List:   PT Treatment Interventions:     PT Goals (current goals can now be found in the care plan section)    Visit Information  Last PT Received On: 08/10/13 Assistance Needed: +2 (for gait) History of Present Illness: Pt is s/p R total hip revision.    Subjective Data      Cognition  Cognition Arousal/Alertness: Awake/alert Behavior During Therapy: WFL for tasks assessed/performed Overall Cognitive Status: No family/caregiver present to determine baseline cognitive functioning (followed commands, awareness of situation)    Balance  Balance Balance Assessed: Yes Static Sitting Balance Static Sitting - Balance Support: Bilateral upper extremity supported Static Sitting - Level of Assistance: 5: Stand by assistance Static Standing Balance Static Standing - Balance Support: Bilateral upper extremity supported Static Standing - Level of Assistance: 4: Min assist  End of Session PT - End of Session Equipment Utilized During Treatment: Gait belt Activity Tolerance: Patient tolerated treatment well Patient left: in chair;with call bell/phone within reach;with chair alarm set Nurse Communication: Mobility status   GP     Greggory Stallion 08/10/2013, 3:10 PM

## 2013-08-11 LAB — CBC
HCT: 30.6 % — ABNORMAL LOW (ref 36.0–46.0)
MCV: 89.7 fL (ref 78.0–100.0)
Platelets: 197 10*3/uL (ref 150–400)
RBC: 3.41 MIL/uL — ABNORMAL LOW (ref 3.87–5.11)
RDW: 14.7 % (ref 11.5–15.5)
WBC: 7.6 10*3/uL (ref 4.0–10.5)

## 2013-08-11 LAB — BASIC METABOLIC PANEL
BUN: 16 mg/dL (ref 6–23)
Chloride: 107 mEq/L (ref 96–112)
GFR calc Af Amer: 82 mL/min — ABNORMAL LOW (ref >90)
GFR calc non Af Amer: 71 mL/min — ABNORMAL LOW (ref >90)
Potassium: 4 mEq/L (ref 3.5–5.1)
Sodium: 141 mEq/L (ref 135–145)

## 2013-08-11 LAB — TROPONIN I: Troponin I: <0.3 ng/mL (ref <0.30)

## 2013-08-11 MED ORDER — FUROSEMIDE 10 MG/ML IJ SOLN
INTRAMUSCULAR | Status: AC
  Filled 2013-08-11: qty 2

## 2013-08-11 MED ORDER — FUROSEMIDE 10 MG/ML IJ SOLN
20.0000 mg | Freq: Every day | INTRAMUSCULAR | Status: DC
  Administered 2013-08-12 – 2013-08-13 (×2): 20 mg via INTRAVENOUS
  Filled 2013-08-11 (×3): qty 2

## 2013-08-11 MED ORDER — FUROSEMIDE 20 MG PO TABS: 20.0000 mg | ORAL_TABLET | Freq: Every day | ORAL | Status: DC

## 2013-08-11 MED ORDER — FUROSEMIDE 10 MG/ML IJ SOLN
20.0000 mg | Freq: Once | INTRAMUSCULAR | Status: AC
  Administered 2013-08-11: 20 mg via INTRAVENOUS

## 2013-08-11 NOTE — Clinical Social Work Psychosocial (Signed)
Clinical Social Work Department  BRIEF PSYCHOSOCIAL ASSESSMENT  Patient: [name]  Account Number: [id number]  Admit date: 08/07/13 Clinical Social Worker Sabino Niemann, MSW Date/Time: 08/10/13 Referred by: Physician Date Referred: 08/08/13 Referred for   SNF Placement   Other Referral:  Interview type: Patient's daughter. Patient is not oriented to time or place  Other interview type: PSYCHOSOCIAL DATA  Living Status: Admitted from facility: [facility] Level of care: ALF Primary support name: [name] Primary support relationship to patient: Daughter Degree of support available:  Strong and vested  CURRENT CONCERNS  Current Concerns   Post-Acute Placement   Other Concerns:  SOCIAL WORK ASSESSMENT / PLAN  CSW spoke with patient's daughter over the phone re: PT recommendation for SNF.   Pt lives at [facility] but will need rehab prior to returning  CSW explained placement process and answered questions.   Pt's daughter reports no preference at this time  CSW completed FL2 and initiated SNF search.     Assessment/plan status: Information/Referral to [facility]  Other assessment/ plan:  Information/referral to community resources:  SNF   PTAR  PATIENT'S/FAMILY'S RESPONSE TO PLAN OF CARE:  Pt's daugheter  reports she is agreeable to her mother going [facility] in order to increase strength and independence with mobility prior to returning home  Pt's daughter verbalized understanding of placement process and appreciation for CSW assist.   Sabino Niemann, MSW, [employer] [phone]

## 2013-08-11 NOTE — Progress Notes (Signed)
Pt sitting up in recliner in hall.  She reports her breathing is better.  Denied pain.  Affect pleasant.  No family present.  Reviewed chart.  Pt continues on antibiotics.  PT recommends Skilled Nursing Facility.    This is not a hospice related admission.  Pt is currently under hospice services for Depressive Disorder with related Anorexia, abnormal loss of weight, and FTT.    Please Notify [facility] of any Pt movements at [phone].  Elijah Birk RN, [facility] RN, [facility] [phone]

## 2013-08-11 NOTE — Progress Notes (Signed)
[facility] PROGRESS NOTE  [name]:[id number] DOB: 07-28-1916 DOA: 08/07/2013 PCP: Bufford Spikes, DO  Assessment/Plan:  1-PNA: Patient with cough, chest x ray with : Patchy infiltrate within the right lung with left lower lobe consolidation and associated effusion. Continue with Vancomycin and cefepime to cover for PNA day 2. Will repeat chest x ray tomorrow.   2- R hip dislocation; s/p THR 11/11; per  ortho   3-Failure to thrive, alzheimers dementia  -stable.   4- Anemia; no s/s of acute bleeding; cont monitor. Hb today at 9.9  5-Hypoxemia: Acute respiratory failure; multifactorial secondary to pna plus component pulmonary edema, HF. Diastolic dysfunction. Will give lasix 20 IV today. Will probably repeat another dose tomorrow. ECHO show hypokinesis. Unclear if new. Discussed results with daughter, will treat for heart failure. No aggressive testing. Will defer cardiology consultation for now.   Suspect patient will be in the hospital over the weekend.   Thank you for the consult. Will follow along with you.   Code Status: DNR Family Communication:  Disposition Plan: SNF when stable.    Procedures: right THR revision   Antibiotics:  Vancomycin 11-13  Cefepime 11-13  HPI/Subjective: Sitting in chair, in no acute distress. Feeling well.    Objective: Filed Vitals:   08/11/13 0458  BP: 161/80  Pulse: 63  Temp: 98.9 F (37.2 C)  Resp: 16    Intake/Output Summary (Last 24 hours) at 08/11/13 1315 Last data filed at 08/11/13 0900  Gross per 24 hour  Intake    450 ml  Output    950 ml  Net   -500 ml   Filed Weights   08/07/13 2012  Weight: 41.731 kg (92 lb)    Exam:   General:  No acute distress.   Cardiovascular: S 1, S 2 RRR  Respiratory: Bilateral crackles, no wheeze.   Abdomen: BS present, soft, nt  Musculoskeletal: trace edema.   Data Reviewed: Basic Metabolic Panel:  Recent Labs Lab 08/07/13 2149 08/09/13 0428  08/10/13 0725 08/11/13 0602  NA 141 139 139 141  K 3.4* 3.9 4.2 4.0  CL 106 108 106 107  CO2 26 20 25 26   GLUCOSE 131* 118* 93 109*  BUN 17 13 15 16   CREATININE 0.77 0.71 0.81 0.72  CALCIUM 9.0 7.9* 8.5 7.9*   Liver Function Tests: No results found for this basename: AST, ALT, ALKPHOS, BILITOT, PROT, ALBUMIN,  in the last 168 hours No results found for this basename: LIPASE, AMYLASE,  in the last 168 hours No results found for this basename: AMMONIA,  in the last 168 hours CBC:  Recent Labs Lab 08/07/13 2149 08/09/13 0428 08/10/13 0725 08/11/13 0602  WBC 6.5 6.3 8.6 7.6  NEUTROABS 4.6  --   --   --   HGB 11.6* 10.2* 11.1* 9.9*  HCT 34.8* 32.3* 34.6* 30.6*  MCV 89.2 91.8 91.1 89.7  PLT 219 175 210 197   Cardiac Enzymes: No results found for this basename: CKTOTAL, CKMB, CKMBINDEX, TROPONINI,  in the last 168 hours BNP (last 3 results)  Recent Labs  08/09/13 0430  PROBNP 22853.0*   CBG: No results found for this basename: GLUCAP,  in the last 168 hours  Recent Results (from the past 240 hour(s))  SURGICAL PCR SCREEN     Status: None   Collection Time    08/08/13 11:48 AM      Result Value Range Status   MRSA, PCR NEGATIVE  NEGATIVE Final   Staphylococcus aureus  NEGATIVE  NEGATIVE Final   Comment:            The Xpert SA Assay (FDA     approved for NASAL specimens     in patients over 77 years of age),     is one component of     a comprehensive surveillance     program.  Test performance has     been validated by [employer] for patients greater     than or equal to 77 year old.     It is not intended     to diagnose infection nor to     guide or monitor treatment.     Studies: Dg Chest 1 View  08/09/2013   CLINICAL DATA:  Cough  EXAM: CHEST - 1 VIEW  COMPARISON:  11/03/2012  FINDINGS: Cardiac shadow remains enlarged. Left basilar consolidation with associated effusion is now seen. Patchy infiltrative changes are noted in the mid and lower right  lung as well. No acute bony abnormality is seen.  IMPRESSION: Patchy infiltrate within the right lung with left lower lobe consolidation and associated effusion. Followup films are recommended.   Electronically Signed   By: Alcide Clever M.D.   On: 08/09/2013 17:47    Scheduled Meds: . aspirin EC  325 mg Oral BID PC  . ceFEPime (MAXIPIME) IV  500 mg Intravenous Q24H  . clonazePAM  1 mg Oral QHS  . docusate sodium  100 mg Oral BID  . feeding supplement (ENSURE COMPLETE)  237 mL Oral BID BM  . ferrous sulfate  325 mg Oral TID PC  . furosemide      . tiZANidine  2 mg Oral BID  . triamcinolone cream  1 application Topical BID  . vancomycin  500 mg Intravenous Q24H   Continuous Infusions:   Principal Problem:   Failed total hip arthroplasty with dislocation Active Problems:   Alzheimer's disease   HYPERTENSION   ABNORMAL EKG   Cough   PNA (pneumonia)    Time spent: 25 minutes.     [name]  [facility] Pager [phone]. If 7PM-7AM, please contact night-coverage at [email or web address], password [facility] 08/11/2013, 1:15 PM  LOS: 4 days

## 2013-08-11 NOTE — Op Note (Signed)
[name], [name] NO.:  [id number]  MEDICAL RECORD NO.:  [id number]  LOCATION:  [id number]                        FACILITY:  [facility]  PHYSICIAN:  Lubertha Basque. Claudie Brickhouse, M.D.DATE OF BIRTH:  1916/08/21  DATE OF PROCEDURE:  08/08/2013 DATE OF DISCHARGE:                              OPERATIVE REPORT   PREOPERATIVE DIAGNOSIS:  Dislocated right total hip replacement.  POSTOPERATIVE DIAGNOSIS:  Dislocated right total hip replacement.  PROCEDURE:  Right total hip replacement revision.  ANESTHESIA:  General.  ATTENDING SURGEON:  Lubertha Basque. Jerl Santos, MD.  ASSISTANTElodia Florence, PA  INDICATION FOR PROCEDURE:  The patient is a 79 year old woman about 4 or 5 years from a right hip replacement.  She has had some trouble with posterior instability since and dislocated on several occasions.  Last night she again tried to bend over and put on her socks and twisted her knee in and it popped out at the hip.  An attempt to closed reduction in the emergency room was unsuccessful and one in the operating room was also unsuccessful under general anesthetic.  We elected to revise her at this point through a constrained liner.  Informed operative consent was obtained from the patient and her daughter after discussion of possible complications including reaction to anesthesia, infection, neurovascular injury, and fracture.  SUMMARY OF FINDINGS AND PROCEDURE:  Under general anesthesia through her old posterior incision, a dislocated right total hip replacement was approached.  Everything same intact and well seated.  The poly was in the appropriate position.  We elected to convert this to a constrained system.  I removed her ball and placed a 28+ 1.5 stainless steel hip ball and then on the acetabular side we placed a GBF polyethylene constrained acetabular liner by DePuy which measured 50 x 28 and was +4 neutral.  We then reduced things and placed the stabilized in ring. This seemed to  give [place] very good stability in all positions.  She was closed and admitted for appropriate care.  DESCRIPTION OF PROCEDURE:  The patient was taken to the operating suite where general anesthetic was applied without difficulty.  She was positioned in lateral decubitus position with the right hip up.  Hip positioners were utilized, axillary roll was placed, and bony prominences were padded.  She was then prepped and draped in normal sterile fashion.  After the administration of IV Kefzol, an appropriate time-out, a posterior hip approach was taken through her old incision. Dissection was carried down through a paucity of adipose tissue to the ITB band gluteus maximus fascia which were incised.  These were retracted and the remaining posterior capsule was tagged and reflected. The hip was already dislocated.  I simply removed the ball with a bone tamp and hammer.  This came off very easily.  We then exposed the entire acetabulum.  The shell seemed very stable.  We removed the polyethylene liner.  I then exposed all the edges of this and placed the aforementioned constrained polyethylene which was a +4 neutral component.  We then placed a smaller 28 hip ball which was slightly longer at +1.5 in her original ball.  We then reduced the hip and it popped  back into place.  We then were able to put the metal ring down around the constrained liner lip as directed in the protocol.  The wound was then thoroughly irrigated followed by reapproximation of the posterior capsule to the greater trochanteric region with nonabsorbable suture in interrupted fashion.  We again irrigated.  Deep tissues along the ITB and gluteus maximus fascia were reapproximated with #1 Vicryl in an interrupted fashion.  We then reapproximated the subcutaneous tissues in 2 layers and closed the skin with staples.  A Mepilex dressing was applied.  Estimated blood loss and intraoperative fluids can be obtained from anesthesia  records.  We did use tranexamic acid towards the end of the case.  DISPOSITION:  The patient was extubated in the operating room and taken to recovery room in stable condition.  She was to be admitted back to the [facility] for appropriate postop care to include perioperative antibiotics and aspirin for DVT prophylaxis.     Lubertha Basque Jerl Santos, M.D.     PGD/MEDQ  D:  08/08/2013  T:  08/09/2013  Job:  [id number]

## 2013-08-11 NOTE — Progress Notes (Signed)
Physical Therapy Treatment Patient Details Name: [name] MRN: [id number] DOB: [birth year] Today's Date: 08/11/2013 Time: 4098-1191 PT Time Calculation (min): 29 min  PT Assessment / Plan / Recommendation  History of Present Illness Pt is s/p R total hip revision.   PT Comments   Pt much improved with gait and mobility today, able to perform transfers and gait at min A level.  Able to gait 120' without rest break.  Pt reports she feels "much better".  Pt will continue to benefit from SNF stay to increase functional independence because she lives alone.  Follow Up Recommendations  SNF     Does the patient have the potential to tolerate intense rehabilitation     Barriers to Discharge        Equipment Recommendations  None recommended by PT    Recommendations for Other Services    Frequency Min 5X/week   Progress towards PT Goals Progress towards PT goals: Goals met and updated - see care plan  Plan Current plan remains appropriate    Precautions / Restrictions Precautions Precautions: Anterior Hip;Fall Restrictions RLE Weight Bearing: Weight bearing as tolerated   Pertinent Vitals/Pain No c/o pain, pt states she rec'd meds prior to treatment.    Mobility  Bed Mobility Bed Mobility: Sitting - Scoot to Edge of Bed Sitting - Scoot to Edge of Bed: 2: Max assist Details for Bed Mobility Assistance: manual facilitation for wt shifts, cuing for each step of progress, pt tends to lean posteriorly Transfers Sit to Stand: 4: Min assist;From chair/3-in-1 Stand to Sit: To chair/3-in-1;4: Min assist Details for Transfer Assistance: assist for anterior wt shift Ambulation/Gait Ambulation/Gait Assistance: 4: Min assist Ambulation Distance (Feet): 120 Feet Assistive device: Rolling walker Ambulation/Gait Assistance Details: step through pattern, good use of RW, no LOB    Exercises Total Joint Exercises Ankle Circles/Pumps: AROM;Both;10 reps Heel Slides: AROM;Both;10  reps Hip ABduction/ADduction: AROM;Both;10 reps Long Arc Quad: AROM;Both;10 reps   PT Diagnosis:    PT Problem List:   PT Treatment Interventions:     PT Goals (current goals can now be found in the care plan section) Acute Rehab PT Goals Time For Goal Achievement: 08/18/13  Visit Information  Last PT Received On: 08/11/13 Assistance Needed: +1 History of Present Illness: Pt is s/p R total hip revision.    Subjective Data      Cognition  Cognition Arousal/Alertness: Awake/alert Behavior During Therapy: WFL for tasks assessed/performed Overall Cognitive Status: Within Functional Limits for tasks assessed    Balance     End of Session PT - End of Session Equipment Utilized During Treatment: Gait belt Activity Tolerance: Patient tolerated treatment well Patient left: in chair;with chair alarm set;with nursing/sitter in room;with call bell/phone within reach Nurse Communication: Mobility status   GP     DONAWERTH,KAREN 08/11/2013, 10:44 AM

## 2013-08-11 NOTE — Progress Notes (Signed)
Subjective: 3 Days Post-Op Procedure(s) (LRB): TOTAL HIP REVISION- right (Right) Working well with physical therapy.  Walked 120 feet.  Continues IV antibiotics for pneumonia.  We will need a SNF on discharge.  Medical team to repeat chest x-ray Saturday. Activity level:  Ambulating with therapy Diet tolerance:  Light heating Voiding:  okay Patient reports pain as 3 on 0-10 scale.    Objective: Vital signs in last 24 hours: Temp:  [97.6 F (36.4 C)-98.9 F (37.2 C)] 98.9 F (37.2 C) (11/14 0458) Pulse Rate:  [58-86] 63 (11/14 0458) Resp:  [16-18] 16 (11/14 0458) BP: (128-161)/(58-80) 161/80 mmHg (11/14 0458) SpO2:  [95 %-96 %] 96 % (11/14 0458)  Labs:  Recent Labs  08/09/13 0428 08/10/13 0725 08/11/13 0602  HGB 10.2* 11.1* 9.9*    Recent Labs  08/10/13 0725 08/11/13 0602  WBC 8.6 7.6  RBC 3.80* 3.41*  HCT 34.6* 30.6*  PLT 210 197    Recent Labs  08/10/13 0725 08/11/13 0602  NA 139 141  K 4.2 4.0  CL 106 107  CO2 25 26  BUN 15 16  CREATININE 0.81 0.72  GLUCOSE 93 109*  CALCIUM 8.5 7.9*   No results found for this basename: LABPT, INR,  in the last 72 hours  Physical Exam:  Neurologically intact ABD soft Neurovascular intact Sensation intact distally Intact pulses distally Dorsiflexion/Plantar flexion intact No cellulitis present Compartment soft  Assessment/Plan:  3 Days Post-Op Procedure(s) (LRB): TOTAL HIP REVISION- right (Right) Advance diet Up with therapy Continue ASA 325 twice a day for DVT prophylaxis/SCDs. Continues on antibiotics per medical team for pneumonia. Potential SNF on  Sunday/Monday when stable and approved by med team May continue to be out of bed with therapy.  Weightbearing as tolerated. CBC stable.  Asahel Risden R 08/11/2013, 1:13 PM

## 2013-08-12 ENCOUNTER — Inpatient Hospital Stay (HOSPITAL_COMMUNITY): Payer: Medicare Other

## 2013-08-12 LAB — BASIC METABOLIC PANEL
BUN: 14 mg/dL (ref 6–23)
Creatinine, Ser: 0.64 mg/dL (ref 0.50–1.10)
GFR calc Af Amer: 85 mL/min — ABNORMAL LOW (ref >90)
GFR calc non Af Amer: 73 mL/min — ABNORMAL LOW (ref >90)
Potassium: 3.9 mEq/L (ref 3.5–5.1)

## 2013-08-12 LAB — CBC
HCT: 35.5 % — ABNORMAL LOW (ref 36.0–46.0)
MCH: 29.5 pg (ref 26.0–34.0)
MCHC: 33 g/dL (ref 30.0–36.0)
Platelets: 256 10*3/uL (ref 150–400)
RDW: 14.5 % (ref 11.5–15.5)

## 2013-08-12 LAB — MAGNESIUM: Magnesium: 1.4 mg/dL — ABNORMAL LOW (ref 1.5–2.5)

## 2013-08-12 MED ORDER — MAGNESIUM SULFATE 40 MG/ML IJ SOLN
2.0000 g | Freq: Once | INTRAMUSCULAR | Status: AC
  Administered 2013-08-12: 2 g via INTRAVENOUS
  Filled 2013-08-12: qty 50

## 2013-08-12 MED ORDER — HYDRALAZINE HCL 20 MG/ML IJ SOLN
10.0000 mg | Freq: Three times a day (TID) | INTRAMUSCULAR | Status: DC | PRN
  Administered 2013-08-14: 10 mg via INTRAVENOUS
  Filled 2013-08-12 (×2): qty 1

## 2013-08-12 NOTE — Progress Notes (Signed)
Subjective: 4 Days Post-Op Procedure(s) (LRB): TOTAL HIP REVISION- right (Right) No obvious confusion. Feeling much better. Very talkative. Chest x-ray from today with some improvement. CBC stable Activity level:  Able to ambulate with assistance. Diet tolerance:  Light  eating Voiding:  Okay Patient reports pain as 2 on 0-10 scale.    Objective: Vital signs in last 24 hours: Temp:  [97.1 F (36.2 C)-98.9 F (37.2 C)] 98.9 F (37.2 C) (11/15 0521) Pulse Rate:  [62-77] 71 (11/15 0521) Resp:  [17-20] 18 (11/15 0521) BP: (128-181)/(45-98) 181/80 mmHg (11/15 0521) SpO2:  [94 %-97 %] 94 % (11/15 0521)  Labs:  Recent Labs  08/10/13 0725 08/11/13 0602 08/12/13 0421  HGB 11.1* 9.9* 11.7*    Recent Labs  08/11/13 0602 08/12/13 0421  WBC 7.6 7.4  RBC 3.41* 3.96  HCT 30.6* 35.5*  PLT 197 256    Recent Labs  08/11/13 0602 08/12/13 0421  NA 141 143  K 4.0 3.9  CL 107 105  CO2 26 27  BUN 16 14  CREATININE 0.72 0.64  GLUCOSE 109* 87  CALCIUM 7.9* 8.8   No results found for this basename: LABPT, INR,  in the last 72 hours  Physical Exam:  Neurologically intact ABD soft Neurovascular intact Sensation intact distally Intact pulses distally Dorsiflexion/Plantar flexion intact No cellulitis present Compartment soft  Assessment/Plan:  4 Days Post-Op Procedure(s) (LRB): TOTAL HIP REVISION- right (Right) Advance diet Up with therapy Discharge to [facility] Sunday/Monday. Continue ASA 325 twice a day/SCDs. Continue IV antibiotics per medicine consult. May be weightbearing as tolerated on both legs.    Haili Donofrio R 08/12/2013, 9:45 AM

## 2013-08-12 NOTE — Progress Notes (Addendum)
[facility] PROGRESS NOTE  [name] ZOX:[id number] DOB: [birth year] DOA: 08/07/2013 PCP: Bufford Spikes, DO  Assessment/Plan: 1-PNA: Patient with cough, chest x ray with : Patchy infiltrate within the right lung with left lower lobe consolidation and associated effusion. Continue with Vancomycin and cefepime to cover for PNA day 3. Chest x ray with improved infiltrates decrease pleural effusion. Will transition to oral antibiotic on Monday. Will probably use Levaquin.  2- R hip dislocation; s/p THR 11/11; per  ortho  3-Failure to thrive, alzheimers dementia  -stable.  4- Anemia; no s/s of acute bleeding; cont monitor. Hb today at 11.7 5-Hypoxemia: Acute respiratory failure; Acute Diastolic HF exacerbation; multifactorial secondary to pna plus component  HF. ECHO show hypokinesis. Unclear if new. Discussed results with daughter, will treat for heart failure. No aggressive testing.  -Continue with IV lasix while on IV antibiotics.  -Repeat renal function in am.  6-Hypomagnesia. Replete with IV mg. This will likely help with Prolong QT interval.  7-Hypertension: PRN hydralazine.   Thank you for the consult. Will follow along with you.   Code Status: DNR Family Communication:  Disposition Plan: SNF likely Monday.    Procedures: right THR revision   Antibiotics:  Vancomycin 11-13  Cefepime 11-13  HPI/Subjective: Sitting in chair, in no acute distress. Feeling well.    Objective: Filed Vitals:   08/12/13 0521  BP: 181/80  Pulse: 71  Temp: 98.9 F (37.2 C)  Resp: 18    Intake/Output Summary (Last 24 hours) at 08/12/13 1152 Last data filed at 08/12/13 1024  Gross per 24 hour  Intake    565 ml  Output    610 ml  Net    -45 ml   Filed Weights   08/07/13 2012  Weight: 41.731 kg (92 lb)    Exam:   General:  No acute distress.   Cardiovascular: S 1, S 2 RRR.   Respiratory: no significant crackles.   Abdomen: BS present, soft, nt  Musculoskeletal:  trace edema.   Data Reviewed: Basic Metabolic Panel:  Recent Labs Lab 08/07/13 2149 08/09/13 0428 08/10/13 0725 08/11/13 0602 08/12/13 0421  NA 141 139 139 141 143  K 3.4* 3.9 4.2 4.0 3.9  CL 106 108 106 107 105  CO2 26 20 25 26 27   GLUCOSE 131* 118* 93 109* 87  BUN 17 13 15 16 14   CREATININE 0.77 0.71 0.81 0.72 0.64  CALCIUM 9.0 7.9* 8.5 7.9* 8.8  MG  --   --   --   --  1.4*   Liver Function Tests: No results found for this basename: AST, ALT, ALKPHOS, BILITOT, PROT, ALBUMIN,  in the last 168 hours No results found for this basename: LIPASE, AMYLASE,  in the last 168 hours No results found for this basename: AMMONIA,  in the last 168 hours CBC:  Recent Labs Lab 08/07/13 2149 08/09/13 0428 08/10/13 0725 08/11/13 0602 08/12/13 0421  WBC 6.5 6.3 8.6 7.6 7.4  NEUTROABS 4.6  --   --   --   --   HGB 11.6* 10.2* 11.1* 9.9* 11.7*  HCT 34.8* 32.3* 34.6* 30.6* 35.5*  MCV 89.2 91.8 91.1 89.7 89.6  PLT 219 175 210 197 256   Cardiac Enzymes:  Recent Labs Lab 08/11/13 1230  TROPONINI <0.30   BNP (last 3 results)  Recent Labs  08/09/13 0430  PROBNP 22853.0*   CBG: No results found for this basename: GLUCAP,  in the last 168 hours  Recent Results (from the  past 240 hour(s))  SURGICAL PCR SCREEN     Status: None   Collection Time    08/08/13 11:48 AM      Result Value Range Status   MRSA, PCR NEGATIVE  NEGATIVE Final   Staphylococcus aureus NEGATIVE  NEGATIVE Final   Comment:            The Xpert SA Assay (FDA     approved for NASAL specimens     in patients over 4 years of age),     is one component of     a comprehensive surveillance     program.  Test performance has     been validated by [employer] for patients greater     than or equal to 108 year old.     It is not intended     to diagnose infection nor to     guide or monitor treatment.     Studies: Dg Chest 2 View  08/12/2013   CLINICAL DATA:  Pneumonia and shortness of breath.  EXAM:  CHEST  2 VIEW  COMPARISON:  One-view chest 08/09/2013.  FINDINGS: The heart is enlarged. Bibasilar airspace disease has improved. A moderate-sized left pleural effusion persists. A small right pleural effusion is evident. Chronic interstitial coarsening is present in the upper lungs. A remote right humeral fracture is evident.  IMPRESSION: 1. Improving bilateral pleural effusions and airspace disease. This likely reflects atelectasis. Infection cannot be excluded. 2. Stable cardiomegaly. 3. Atherosclerotic disease of the thoracic aorta.   Electronically Signed   By: Gennette Pac M.D.   On: 08/12/2013 07:52    Scheduled Meds: . aspirin EC  325 mg Oral BID PC  . ceFEPime (MAXIPIME) IV  500 mg Intravenous Q24H  . clonazePAM  1 mg Oral QHS  . docusate sodium  100 mg Oral BID  . feeding supplement (ENSURE COMPLETE)  237 mL Oral BID BM  . ferrous sulfate  325 mg Oral TID PC  . furosemide  20 mg Intravenous Daily  . tiZANidine  2 mg Oral BID  . triamcinolone cream  1 application Topical BID  . vancomycin  500 mg Intravenous Q24H   Continuous Infusions:   Principal Problem:   Failed total hip arthroplasty with dislocation Active Problems:   Alzheimer's disease   HYPERTENSION   ABNORMAL EKG   Cough   PNA (pneumonia)    Time spent: 25 minutes.     Kason Benak  [facility] Pager [phone]. If 7PM-7AM, please contact night-coverage at [email or web address], password [facility] 08/12/2013, 11:52 AM  LOS: 5 days

## 2013-08-13 LAB — BASIC METABOLIC PANEL
BUN: 11 mg/dL (ref 6–23)
Creatinine, Ser: 0.57 mg/dL (ref 0.50–1.10)
GFR calc Af Amer: 88 mL/min — ABNORMAL LOW (ref >90)
GFR calc non Af Amer: 76 mL/min — ABNORMAL LOW (ref >90)
Potassium: 3.4 mEq/L — ABNORMAL LOW (ref 3.5–5.1)

## 2013-08-13 MED ORDER — POTASSIUM CHLORIDE CRYS ER 20 MEQ PO TBCR
40.0000 meq | EXTENDED_RELEASE_TABLET | Freq: Once | ORAL | Status: AC
  Administered 2013-08-13: 40 meq via ORAL
  Filled 2013-08-13: qty 2

## 2013-08-13 NOTE — Progress Notes (Signed)
Reported to RN per daughter's request, pt to be evaluated by PT for IPR.  Also, patient's clothes are at bedside for transport tomorrow 08/14/13.

## 2013-08-13 NOTE — Progress Notes (Signed)
ANTIBIOTIC CONSULT NOTE  Pharmacy Consult for Cefepime Indication: rule out pneumonia  Labs:  Recent Labs  08/11/13 0602 08/12/13 0421  WBC 7.6 7.4  HGB 9.9* 11.7*  PLT 197 256  CREATININE 0.72 0.64   Estimated Creatinine Clearance: 24.1 ml/min (by C-G formula based on Cr of 0.64).    Assessment: 79yo female admitted 2d ago for closed reduction of dislocated hip under general anesthesia, started antibiotics for pneumonia.  Completed course of vancomycin, now continues on cefepime with plans to transition to po antibiotics Monday  Goal of Therapy:  Appropriate dosing  Plan:  1) Continue Cefepime 500 mg iv Q 24 hours 2) Dose of Levaquin should by 750 mg po Q 48 hours at discharge  Thank you. Okey Regal, PharmD [phone] 08/13/2013,9:35 AM

## 2013-08-13 NOTE — Progress Notes (Signed)
[facility] PROGRESS NOTE  [name] PEREZGARCIA Haley:[id number] DOB: May 21, 1916 DOA: 08/07/2013 PCP: Bufford Spikes, DO  Assessment/Plan: 1-PNA: Patient with cough, chest x ray with : Patchy infiltrate within the right lung with left lower lobe consolidation and associated effusion. Continue cefepime to cover for PNA day 4. Chest x ray with improved infiltrates decrease pleural effusion. Will transition to oral antibiotic on Monday. Will probably use Levaquin. Received 3 days of Vancomycin.  2- R hip dislocation; s/p THR 11/11; per  ortho  3-Failure to thrive, alzheimers dementia  -stable.  4- Anemia; no s/s of acute bleeding; cont monitor. Hb  at 11.7 5-Hypoxemia: Acute respiratory failure; Acute Diastolic HF exacerbation; multifactorial secondary to pna plus component  HF. ECHO show hypokinesis. Unclear if new. Discussed results with daughter, will treat for heart failure. No aggressive testing.  -Continue with IV lasix while on IV antibiotics.  -Repeat renal function in am.  6-Hypomagnesia. Replaced with IV. Repeat Mg in am.  7-Hypertension: PRN hydralazine.   Thank you for the consult. Will follow along with you.   Code Status: DNR Family Communication:  Disposition Plan: SNF likely Monday.    Procedures: right THR revision   Antibiotics:  Vancomycin 11-13  Cefepime 11-13  HPI/Subjective: Sitting in bed, breathing better. She wanted to ambulate.     Objective: Filed Vitals:   08/13/13 0827  BP:   Pulse:   Temp:   Resp: 16    Intake/Output Summary (Last 24 hours) at 08/13/13 1116 Last data filed at 08/13/13 0945  Gross per 24 hour  Intake    530 ml  Output    200 ml  Net    330 ml   Filed Weights   08/07/13 2012  Weight: 41.731 kg (92 lb)    Exam:   General:  No acute distress.   Cardiovascular: S 1, S 2 RRR.   Respiratory: no significant crackles.   Abdomen: BS present, soft, nt  Musculoskeletal: trace edema.   Data Reviewed: Basic Metabolic  Panel:  Recent Labs Lab 08/09/13 0428 08/10/13 0725 08/11/13 0602 08/12/13 0421 08/13/13 0905  NA 139 139 141 143 142  K 3.9 4.2 4.0 3.9 3.4*  CL 108 106 107 105 102  CO2 20 25 26 27 29   GLUCOSE 118* 93 109* 87 103*  BUN 13 15 16 14 11   CREATININE 0.71 0.81 0.72 0.64 0.57  CALCIUM 7.9* 8.5 7.9* 8.8 9.1  MG  --   --   --  1.4*  --    Liver Function Tests: No results found for this basename: AST, ALT, ALKPHOS, BILITOT, PROT, ALBUMIN,  in the last 168 hours No results found for this basename: LIPASE, AMYLASE,  in the last 168 hours No results found for this basename: AMMONIA,  in the last 168 hours CBC:  Recent Labs Lab 08/07/13 2149 08/09/13 0428 08/10/13 0725 08/11/13 0602 08/12/13 0421  WBC 6.5 6.3 8.6 7.6 7.4  NEUTROABS 4.6  --   --   --   --   HGB 11.6* 10.2* 11.1* 9.9* 11.7*  HCT 34.8* 32.3* 34.6* 30.6* 35.5*  MCV 89.2 91.8 91.1 89.7 89.6  PLT 219 175 210 197 256   Cardiac Enzymes:  Recent Labs Lab 08/11/13 1230  TROPONINI <0.30   BNP (last 3 results)  Recent Labs  08/09/13 0430  PROBNP 22853.0*   CBG: No results found for this basename: GLUCAP,  in the last 168 hours  Recent Results (from the past 240 hour(s))  SURGICAL  PCR SCREEN     Status: None   Collection Time    08/08/13 11:48 AM      Result Value Range Status   MRSA, PCR NEGATIVE  NEGATIVE Final   Staphylococcus aureus NEGATIVE  NEGATIVE Final   Comment:            The Xpert SA Assay (FDA     approved for NASAL specimens     in patients over 77 years of age),     is one component of     a comprehensive surveillance     program.  Test performance has     been validated by [employer] for patients greater     than or equal to 77 year old.     It is not intended     to diagnose infection nor to     guide or monitor treatment.     Studies: Dg Chest 2 View  08/12/2013   CLINICAL DATA:  Pneumonia and shortness of breath.  EXAM: CHEST  2 VIEW  COMPARISON:  One-view chest  08/09/2013.  FINDINGS: The heart is enlarged. Bibasilar airspace disease has improved. A moderate-sized left pleural effusion persists. A small right pleural effusion is evident. Chronic interstitial coarsening is present in the upper lungs. A remote right humeral fracture is evident.  IMPRESSION: 1. Improving bilateral pleural effusions and airspace disease. This likely reflects atelectasis. Infection cannot be excluded. 2. Stable cardiomegaly. 3. Atherosclerotic disease of the thoracic aorta.   Electronically Signed   By: Gennette Pac M.D.   On: 08/12/2013 07:52    Scheduled Meds: . aspirin EC  325 mg Oral BID PC  . ceFEPime (MAXIPIME) IV  500 mg Intravenous Q24H  . clonazePAM  1 mg Oral QHS  . docusate sodium  100 mg Oral BID  . feeding supplement (ENSURE COMPLETE)  237 mL Oral BID BM  . ferrous sulfate  325 mg Oral TID PC  . furosemide  20 mg Intravenous Daily  . potassium chloride  40 mEq Oral Once  . tiZANidine  2 mg Oral BID  . triamcinolone cream  1 application Topical BID   Continuous Infusions:   Principal Problem:   Failed total hip arthroplasty with dislocation Active Problems:   Alzheimer's disease   HYPERTENSION   ABNORMAL EKG   Cough   PNA (pneumonia)    Time spent: 25 minutes.     Anaiza Behrens  [facility] Pager [phone]. If 7PM-7AM, please contact night-coverage at [email or web address], password [facility] 08/13/2013, 11:16 AM  LOS: 6 days

## 2013-08-13 NOTE — Progress Notes (Signed)
Subjective: 5 Days Post-Op Procedure(s) (LRB): TOTAL HIP REVISION- right (Right) Patient needed a sitter to monitor her activities as she is a fall risk and was trying to get up on her own. May be ready for SNF Monday if okay with medical consult. Pharmacy has recommended Levaquin 750 mg every 48 hours x8 days. Activity level:  Working with therapy maximal assist. Diet tolerance:  Eating light Voiding:  Okay Patient reports pain as 2 on 0-10 scale.    Objective: Vital signs in last 24 hours: Temp:  [98 F (36.7 C)-98.3 F (36.8 C)] 98.2 F (36.8 C) (11/16 0614) Pulse Rate:  [62-73] 73 (11/16 0614) Resp:  [16-20] 16 (11/16 0827) BP: (138-185)/(77-86) 138/79 mmHg (11/16 0614) SpO2:  [95 %-98 %] 95 % (11/16 0827)  Labs:  Recent Labs  08/11/13 0602 08/12/13 0421  HGB 9.9* 11.7*    Recent Labs  08/11/13 0602 08/12/13 0421  WBC 7.6 7.4  RBC 3.41* 3.96  HCT 30.6* 35.5*  PLT 197 256    Recent Labs  08/12/13 0421 08/13/13 0905  NA 143 142  K 3.9 3.4*  CL 105 102  CO2 27 29  BUN 14 11  CREATININE 0.64 0.57  GLUCOSE 87 103*  CALCIUM 8.8 9.1   No results found for this basename: LABPT, INR,  in the last 72 hours  Physical Exam:  Neurologically intact ABD soft Neurovascular intact Sensation intact distally Intact pulses distally Dorsiflexion/Plantar flexion intact No cellulitis present Compartment soft  Assessment/Plan:  5 Days Post-Op Procedure(s) (LRB): TOTAL HIP REVISION- right (Right) Advance diet Up with therapy Discharge to SNF in 1-2 days. She is walking the halls with therapy now. Probably transition to by mouth antibiotics per medicine consult soon. Continue ASA 325 twice a day x4 weeks total.    [name] R 08/13/2013, 11:20 AM

## 2013-08-14 ENCOUNTER — Encounter (HOSPITAL_COMMUNITY): Payer: Self-pay | Admitting: Orthopaedic Surgery

## 2013-08-14 DIAGNOSIS — E43 Unspecified severe protein-calorie malnutrition: Secondary | ICD-10-CM | POA: Diagnosis present

## 2013-08-14 LAB — BASIC METABOLIC PANEL
CO2: 33 mEq/L — ABNORMAL HIGH (ref 19–32)
Calcium: 9.4 mg/dL (ref 8.4–10.5)
Chloride: 101 mEq/L (ref 96–112)
Creatinine, Ser: 0.65 mg/dL (ref 0.50–1.10)
GFR calc Af Amer: 85 mL/min — ABNORMAL LOW (ref >90)

## 2013-08-14 LAB — MAGNESIUM: Magnesium: 1.9 mg/dL (ref 1.5–2.5)

## 2013-08-14 MED ORDER — ASPIRIN 325 MG PO TBEC: 325.0000 mg | DELAYED_RELEASE_TABLET | Freq: Two times a day (BID) | ORAL | Status: DC

## 2013-08-14 MED ORDER — LEVOFLOXACIN 250 MG PO TABS
250.0000 mg | ORAL_TABLET | Freq: Every day | ORAL | Status: DC
  Administered 2013-08-14 – 2013-08-15 (×2): 250 mg via ORAL
  Filled 2013-08-14 (×2): qty 1

## 2013-08-14 MED ORDER — POTASSIUM CHLORIDE CRYS ER 20 MEQ PO TBCR: 20.0000 meq | EXTENDED_RELEASE_TABLET | Freq: Every day | ORAL | Status: DC

## 2013-08-14 MED ORDER — HYDROCODONE-ACETAMINOPHEN 5-325 MG PO TABS: 1.0000 | ORAL_TABLET | Freq: Four times a day (QID) | ORAL | Status: DC | PRN

## 2013-08-14 MED ORDER — ACETAMINOPHEN 325 MG PO TABS: 650.0000 mg | ORAL_TABLET | Freq: Four times a day (QID) | ORAL | Status: DC | PRN

## 2013-08-14 MED ORDER — LEVOFLOXACIN 250 MG PO TABS: 250.0000 mg | ORAL_TABLET | Freq: Every day | ORAL | Status: DC

## 2013-08-14 MED ORDER — FUROSEMIDE 20 MG PO TABS
20.0000 mg | ORAL_TABLET | Freq: Every day | ORAL | Status: DC
  Filled 2013-08-14 (×2): qty 1

## 2013-08-14 MED ORDER — ENSURE COMPLETE PO LIQD: 237.0000 mL | Freq: Two times a day (BID) | ORAL | Status: DC

## 2013-08-14 MED ORDER — FUROSEMIDE 20 MG PO TABS: 20.0000 mg | ORAL_TABLET | Freq: Every day | ORAL | Status: DC

## 2013-08-14 MED ORDER — CLONAZEPAM 1 MG PO TABS: 1.0000 mg | ORAL_TABLET | Freq: Every day | ORAL | Status: DC

## 2013-08-14 NOTE — Progress Notes (Signed)
Subjective: 6 Days Post-Op Procedure(s) (LRB): TOTAL HIP REVISION- right (Right) Severe malnutrition noted by dietitian. Patient put on supplements. Cleared by medical team for skilled nursing facility placement today we'll continue Levaquin for 5 days. Activity level:  Walking weightbearing as tolerated Diet tolerance:  Eating also taking supplements for nutritional support Voiding:  Okay Patient reports pain as 2 on 0-10 scale.    Objective: Vital signs in last 24 hours: Temp:  [97.5 F (36.4 C)-98.3 F (36.8 C)] 98.3 F (36.8 C) (11/17 0557) Pulse Rate:  [57-70] 65 (11/17 0557) Resp:  [16-20] 16 (11/17 0557) BP: (154-157)/(65-91) 155/90 mmHg (11/17 0557) SpO2:  [95 %-96 %] 96 % (11/17 0557)  Labs:  Recent Labs  08/12/13 0421  HGB 11.7*    Recent Labs  08/12/13 0421  WBC 7.4  RBC 3.96  HCT 35.5*  PLT 256    Recent Labs  08/13/13 0905 08/14/13 0625  NA 142 143  K 3.4* 3.6  CL 102 101  CO2 29 33*  BUN 11 12  CREATININE 0.57 0.65  GLUCOSE 103* 104*  CALCIUM 9.1 9.4   No results found for this basename: LABPT, INR,  in the last 72 hours  Physical Exam:  Neurologically intact ABD soft Neurovascular intact Sensation intact distally Intact pulses distally Dorsiflexion/Plantar flexion intact No cellulitis present Compartment soft  Assessment/Plan:  6 Days Post-Op Procedure(s) (LRB): TOTAL HIP REVISION- right (Right) Advance diet Up with therapy Discharge to SNF today if bed okay Continue nutritional supplements for malnutrition and Levaquin ASA 3251 pill twice a day x4 weeks. Weightbearing as tolerated and return to office in 2 weeks. Prescriptions are in chart.    Babs Dabbs R 08/14/2013, 9:35 AM

## 2013-08-14 NOTE — Progress Notes (Signed)
CSW spoke with the Pt's daughter concerning SNF Placement. [name] [phone])   Pt's daughter would like [facility] or [facility].   CSW notified both facilities and sent D/C summary and AVS for review.   Pt's daughter does know that a decision for placement is tomorrow and the Pt will need to be d/c'd.   [employer] [facility] stated they will contact CSW in the a.m.      Leron Croak [facility]  4N 1-16;  6N1-16 Phone: [phone]     Leron Croak [facility]  [facility]  4N 1-16;  [phone] Phone: [phone]

## 2013-08-14 NOTE — Discharge Summary (Addendum)
Patient ID: [name] MRN: [id number] DOB/AGE: Oct 23, 1915 79 y.o.  Admit date: 08/07/2013 Discharge date: 08/15/2013  Admission Diagnoses:  Principal Problem:   Failed total hip arthroplasty with dislocation Active Problems:   Severe malnutrition   Alzheimer's disease   HYPERTENSION   ABNORMAL EKG   Cough   PNA (pneumonia)   Discharge Diagnoses:  Same  Past Medical History  Diagnosis Date  . COPD (chronic obstructive pulmonary disease)   . Hypertension   . Oral aphthae   . Orthostatic hypotension   . Senile osteoporosis   . Atrophy of vulva   . Unspecified urinary incontinence   . Urinary frequency   . Unspecified vitamin D deficiency   . Anxiety state, unspecified   . Restless legs syndrome (RLS)   . Unspecified disorder of kidney and ureter   . Atrioventricular block, unspecified   . Closed dislocation of shoulder, unspecified site   . Anemia, unspecified   . Altered mental status   . Lumbago   . Transient disorder of initiating or maintaining sleep   . Cellulitis and abscess of unspecified site   . Sebaceous cyst   . Insomnia, unspecified   . Dementia in conditions classified elsewhere without behavioral disturbance(294.10)   . Depressive disorder, not elsewhere classified   . Abnormality of gait   . Adult failure to thrive   . Headache(784.0)   . Type II or unspecified type diabetes mellitus without mention of complication, not stated as uncontrolled   . Gout, unspecified   . Extrinsic asthma, unspecified   . Chronic kidney disease, stage I   . Osteoarthrosis, unspecified whether generalized or localized, unspecified site   . Spinal stenosis, unspecified region other than cervical   . Edema   . Tachycardia, unspecified     Surgeries: Procedure(s): TOTAL HIP REVISION- right on 08/07/2013 - 08/08/2013   Consultants: Treatment Team:  Mahala Menghini, MD  Discharged Condition: Improved  Hospital Course: SALA TAGUE is an 79 y.o. female who  was admitted 08/07/2013 for operative treatment ofFailed total hip arthroplasty with dislocation. Patient has severe unremitting pain that affects sleep, daily activities, and work/hobbies. After pre-op clearance the patient was taken to the operating room on 08/07/2013 - 08/08/2013 and underwent  Procedure(s): TOTAL HIP REVISION- right.    Patient was given perioperative antibiotics: Anti-infectives   Start     Dose/Rate Route Frequency Ordered Stop   08/14/13 1000  levofloxacin (LEVAQUIN) tablet 250 mg     250 mg Oral Daily 08/14/13 0754     08/14/13 0000  levofloxacin (LEVAQUIN) 250 MG tablet     250 mg Oral Daily 08/14/13 0801     08/10/13 1000  ceFEPIme (MAXIPIME) 500 mg in dextrose 5 % 50 mL IVPB  Status:  Discontinued     500 mg 100 mL/hr over 30 Minutes Intravenous Every 24 hours 08/10/13 0744 08/14/13 0754   08/10/13 0800  vancomycin (VANCOCIN) 500 mg in sodium chloride 0.9 % 100 mL IVPB  Status:  Discontinued     500 mg 100 mL/hr over 60 Minutes Intravenous Every 24 hours 08/10/13 0744 08/13/13 0745   08/08/13 1900  ceFAZolin (ANCEF) IVPB 2 g/50 mL premix     2 g 100 mL/hr over 30 Minutes Intravenous Every 6 hours 08/08/13 1845 08/09/13 0133   08/08/13 1452  ceFAZolin (ANCEF) 2-3 GM-% IVPB SOLR    Comments:  Merril Abbe   : cabinet override      08/08/13 1452 08/08/13 1520  Patient was given sequential compression devices, early ambulation, and chemoprophylaxis to prevent DVT.  Patient benefited maximally from hospital stay and there were no complications.    Recent vital signs: Patient Vitals for the past 24 hrs:  BP Temp Temp src Pulse Resp SpO2  08/14/13 0800 - - - - 16 96 %  08/14/13 0557 155/90 mmHg 98.3 F (36.8 C) Oral 65 16 96 %  08/13/13 2100 157/65 mmHg 97.5 F (36.4 C) Oral 57 16 95 %  08/13/13 1614 - - - - 16 95 %  08/13/13 1555 154/91 mmHg 97.7 F (36.5 C) Oral 70 20 96 %  08/13/13 1214 - - - - 16 95 %     Recent laboratory studies:  Recent  Labs  08/12/13 0421 08/13/13 0905 08/14/13 0625  WBC 7.4  --   --   HGB 11.7*  --   --   HCT 35.5*  --   --   PLT 256  --   --   NA 143 142 143  K 3.9 3.4* 3.6  CL 105 102 101  CO2 27 29 33*  BUN 14 11 12   CREATININE 0.64 0.57 0.65  GLUCOSE 87 103* 104*  CALCIUM 8.8 9.1 9.4     Discharge Medications:     Medication List         acetaminophen 325 MG tablet  Commonly known as:  TYLENOL  Take 2 tablets (650 mg total) by mouth every 6 (six) hours as needed for mild pain (or Fever >/= 101).     aspirin 325 MG EC tablet  Take 1 tablet (325 mg total) by mouth 2 (two) times daily after a meal.     clonazePAM 1 MG tablet  Commonly known as:  KLONOPIN  Take 1 tablet (1 mg total) by mouth at bedtime.     feeding supplement (ENSURE COMPLETE) Liqd  Take 237 mLs by mouth 2 (two) times daily between meals.     furosemide 20 MG tablet  Commonly known as:  LASIX  Take 1 tablet (20 mg total) by mouth daily.     HYDROcodone-acetaminophen 5-325 MG per tablet  Commonly known as:  NORCO/VICODIN  Take 1 tablet by mouth every 6 (six) hours as needed for moderate pain.     levofloxacin 250 MG tablet  Commonly known as:  LEVAQUIN  Take 1 tablet (250 mg total) by mouth daily.     potassium chloride SA 20 MEQ tablet  Commonly known as:  K-DUR,KLOR-CON  Take 1 tablet (20 mEq total) by mouth daily.     tiZANidine 2 MG tablet  Commonly known as:  ZANAFLEX  Take 2 mg by mouth 2 (two) times daily.     triamcinolone cream 0.5 %  Commonly known as:  KENALOG  Apply 1 application topically 2 (two) times daily. To rash on legs       may be weightbearing as tolerated. Continue ASA 3251 pill twice a day x4 weeks. Staples out at 2 weeks from surgery. Return to office Dr. Jerl Santos 2 weeks. May change dressing when necessary.  Diagnostic Studies: Dg Chest 1 View  08/09/2013   CLINICAL DATA:  Cough  EXAM: CHEST - 1 VIEW  COMPARISON:  11/03/2012  FINDINGS: Cardiac shadow remains enlarged. Left  basilar consolidation with associated effusion is now seen. Patchy infiltrative changes are noted in the mid and lower right lung as well. No acute bony abnormality is seen.  IMPRESSION: Patchy infiltrate within the right lung with left  lower lobe consolidation and associated effusion. Followup films are recommended.   Electronically Signed   By: Alcide Clever M.D.   On: 08/09/2013 17:47   Dg Chest 2 View  08/12/2013   CLINICAL DATA:  Pneumonia and shortness of breath.  EXAM: CHEST  2 VIEW  COMPARISON:  One-view chest 08/09/2013.  FINDINGS: The heart is enlarged. Bibasilar airspace disease has improved. A moderate-sized left pleural effusion persists. A small right pleural effusion is evident. Chronic interstitial coarsening is present in the upper lungs. A remote right humeral fracture is evident.  IMPRESSION: 1. Improving bilateral pleural effusions and airspace disease. This likely reflects atelectasis. Infection cannot be excluded. 2. Stable cardiomegaly. 3. Atherosclerotic disease of the thoracic aorta.   Electronically Signed   By: Gennette Pac M.D.   On: 08/12/2013 07:52   Dg Hip 1 View Right  08/08/2013   CLINICAL DATA:  Right hip closed reduction.  EXAM: RIGHT HIP - 1 VIEW  COMPARISON:  Yesterday.  FINDINGS: Three C arm views of the right hip demonstrate the femoral component of the right hip prosthesis overlying various portions of the acetabular component. The femoral component does not appear normally located on any of the views.  IMPRESSION: Varying positions of the femoral head component of the right hip prosthesis with no images showing a normal relationship with the acetabular component.   Electronically Signed   By: Gordan Payment M.D.   On: 08/08/2013 01:17   Dg Hip Complete Right  08/07/2013   CLINICAL DATA:  Hip pain.  EXAM: RIGHT HIP - COMPLETE 2+ VIEW  COMPARISON:  09/24/2012  FINDINGS: Again noted is superior dislocation of the right hip replacement, similar to prior study. No  fracture. Diffuse osteopenia. Mild degenerative changes in the left hip.  IMPRESSION: Superior dislocation of the right hip replacement.   Electronically Signed   By: Charlett Nose M.D.   On: 08/07/2013 20:59   Dg Hip Portable 1 View Right  08/08/2013   CLINICAL DATA:  Status post attempted reduction of a right hip dislocation.  EXAM: PORTABLE RIGHT HIP - 1 VIEW  COMPARISON:  Earlier on the same date.  FINDINGS: Again demonstrated is superior dislocation of the femoral head component of a right total hip prosthesis. No fracture seen. Diffuse osteopenia. Lower lumbar spine fixation hardware and bone fusion. Arterial calcifications.  IMPRESSION: Continued superior dislocation of the femoral component of the right total hip prosthesis.   Electronically Signed   By: Gordan Payment M.D.   On: 08/08/2013 00:40   Dg C-arm 1-60 Min-no Report  08/08/2013   CLINICAL DATA: closed reduction right hip   C-ARM 1-60 MINUTES  Fluoroscopy was utilized by the requesting physician.  No radiographic  interpretation.     Disposition: 01-Home or Self Care      Discharge Orders   Future Orders Complete By Expires   Call MD / Call 911  As directed    Comments:     If you experience chest pain or shortness of breath, CALL 911 and be transported to the hospital emergency room.  If you develope a fever above 101 F, pus (white drainage) or increased drainage or redness at the wound, or calf pain, call your surgeon's office.   Constipation Prevention  As directed    Comments:     Drink plenty of fluids.  Prune juice may be helpful.  You may use a stool softener, such as Colace (over the counter) 100 mg twice a day.  Use  MiraLax (over the counter) for constipation as needed.   Diet - low sodium heart healthy  As directed    Increase activity slowly as tolerated  As directed       Follow-up Information   Follow up with DALLDORF,PETER G, MD. Call in 2 weeks.   Specialty:  Orthopedic Surgery   Contact information:   [address] [place] [address] [phone]        Signed: Prince Rome 08/14/2013, 9:40 AM

## 2013-08-14 NOTE — Clinical Documentation Improvement (Signed)
   08/14/13 note doc "FTT" per dietitian note meets "Severe malnutrition in the context of chronic illness" , please document appropriate nutritional clinical condition.  Thank you   Possible Clinical Conditions?  Severe Malnutrition    Protein Calorie Malnutrition  Severe Protein Calorie Malnutrition  Cachexia    Other Condition Cannot clinically determine  BMI 20.64   Risk Factors: s/p hip replacement, PNA, Acute Resp Failure, Acute Diastolic Heart Failure Exacerbation   Recommendation : Ensure Complete po BID, each supplement provides 350 kcal and 13 grams of protein.  Thank You, Lavonda Jumbo ,RN Clinical Documentation Specialist:  [phone]  [facility]- Health Information Management

## 2013-08-14 NOTE — Progress Notes (Signed)
   CSW provided list of bed offers for SNF facilities.    CSW to f/u for bed selection and further d/c planning.    Bufford Helms LCSWA  [facility]  4N 1-16;  6N1-16 Phone: [phone]     

## 2013-08-14 NOTE — Progress Notes (Signed)
[facility] PROGRESS NOTE  [name] ZOX:[id number] DOB: 1916-01-25 DOA: 08/07/2013 PCP: [name] Spikes, DO  Assessment/Plan: 1-PNA: Patient with cough, chest x ray with : Patchy infiltrate within the right lung with left lower lobe consolidation and associated effusion. Received cefepime for 4 days. Chest x ray with improved infiltrates decrease pleural effusion. Received 3 days of Vancomycin. Will provide 5 days of Levaquin.   2- R hip dislocation; s/p THR 11/11; per  ortho  3-Failure to thrive, alzheimers dementia  -stable.  4- Anemia; no s/s of acute bleeding; cont monitor. Hb  at 11.7 5-Hypoxemia: Acute respiratory failure; Acute Diastolic HF exacerbation; multifactorial secondary to pna plus component  HF. ECHO show hypokinesis. Unclear if new. Discussed results with daughter, will treat for heart failure. No aggressive testing.  -Transition to oral lasix 20 mg daily.  -Need B-met to follow renal function at facility.    6-Hypomagnesia. Replaced. Mg at 1.9.  7-Hypertension: PRN hydralazine.   Thank you for the consult. Patient stable to be transfer. Will sign off. Please call with questions.   Code Status: DNR Family Communication:  Disposition Plan: SNF likely Monday.    Procedures: right THR revision   Antibiotics:  Vancomycin 11-13  Cefepime 11-13  HPI/Subjective: Sitting in bed, breathing better. Had 2 bowel movement.     Objective: Filed Vitals:   08/14/13 0557  BP: 155/90  Pulse: 65  Temp: 98.3 F (36.8 C)  Resp: 16    Intake/Output Summary (Last 24 hours) at 08/14/13 0801 Last data filed at 08/14/13 0557  Gross per 24 hour  Intake    410 ml  Output      0 ml  Net    410 ml   Filed Weights   08/07/13 2012  Weight: 41.731 kg (92 lb)    Exam:   General:  No acute distress.   Cardiovascular: S 1, S 2 RRR.   Respiratory: no significant crackles.   Abdomen: BS present, soft, nt  Musculoskeletal: trace edema.   Data  Reviewed: Basic Metabolic Panel:  Recent Labs Lab 08/10/13 0725 08/11/13 0602 08/12/13 0421 08/13/13 0905 08/14/13 0625  NA 139 141 143 142 143  K 4.2 4.0 3.9 3.4* 3.6  CL 106 107 105 102 101  CO2 25 26 27 29  33*  GLUCOSE 93 109* 87 103* 104*  BUN 15 16 14 11 12   CREATININE 0.81 0.72 0.64 0.57 0.65  CALCIUM 8.5 7.9* 8.8 9.1 9.4  MG  --   --  1.4*  --  1.9   Liver Function Tests: No results found for this basename: AST, ALT, ALKPHOS, BILITOT, PROT, ALBUMIN,  in the last 168 hours No results found for this basename: LIPASE, AMYLASE,  in the last 168 hours No results found for this basename: AMMONIA,  in the last 168 hours CBC:  Recent Labs Lab 08/07/13 2149 08/09/13 0428 08/10/13 0725 08/11/13 0602 08/12/13 0421  WBC 6.5 6.3 8.6 7.6 7.4  NEUTROABS 4.6  --   --   --   --   HGB 11.6* 10.2* 11.1* 9.9* 11.7*  HCT 34.8* 32.3* 34.6* 30.6* 35.5*  MCV 89.2 91.8 91.1 89.7 89.6  PLT 219 175 210 197 256   Cardiac Enzymes:  Recent Labs Lab 08/11/13 1230  TROPONINI <0.30   BNP (last 3 results)  Recent Labs  08/09/13 0430  PROBNP 22853.0*   CBG: No results found for this basename: GLUCAP,  in the last 168 hours  Recent Results (from the past  240 hour(s))  SURGICAL PCR SCREEN     Status: None   Collection Time    08/08/13 11:48 AM      Result Value Range Status   MRSA, PCR NEGATIVE  NEGATIVE Final   Staphylococcus aureus NEGATIVE  NEGATIVE Final   Comment:            The Xpert SA Assay (FDA     approved for NASAL specimens     in patients over 72 years of age),     is one component of     a comprehensive surveillance     program.  Test performance has     been validated by [employer] for patients greater     than or equal to 4 year old.     It is not intended     to diagnose infection nor to     guide or monitor treatment.     Studies: No results found.  Scheduled Meds: . aspirin EC  325 mg Oral BID PC  . clonazePAM  1 mg Oral QHS  . docusate  sodium  100 mg Oral BID  . feeding supplement (ENSURE COMPLETE)  237 mL Oral BID BM  . ferrous sulfate  325 mg Oral TID PC  . furosemide  20 mg Oral Daily  . levofloxacin  250 mg Oral Daily  . tiZANidine  2 mg Oral BID  . triamcinolone cream  1 application Topical BID   Continuous Infusions:   Principal Problem:   Failed total hip arthroplasty with dislocation Active Problems:   Alzheimer's disease   HYPERTENSION   ABNORMAL EKG   Cough   PNA (pneumonia)    Time spent: 25 minutes.     Huy Majid  [facility] Pager [phone]. If 7PM-7AM, please contact night-coverage at [email or web address], password [facility] 08/14/2013, 8:01 AM  LOS: 7 days

## 2013-08-14 NOTE — Progress Notes (Signed)
Pt sitting up in chair fully dressed.  Walker in front of her.   She reported only intermittent pain in her hip.  Pt pleasant and smiling.    Reviewed chart.  Pt possible discharge today?.  SNF vs IPR.  This is not a hospice related admission.  Please notify [facility] of any Pt movements at [phone]/  Elijah Birk RN, [facility] RN, [facility] [place] [phone]

## 2013-08-14 NOTE — Progress Notes (Signed)
Physical Therapy Treatment Patient Details Name: [name] MRN: [id number] DOB: Apr 23, 1916 Today's Date: 08/14/2013 Time: 4098-1191 PT Time Calculation (min): 24 min  PT Assessment / Plan / Recommendation  History of Present Illness Pt is s/p R total hip revision direct anterior approach   PT Comments   Pt progressing towards physical therapy goals. During gait training, pt ambulated on room air, and did not report any SOB or dizziness/lightheadedness. Pt was at 94% saturation on room air during exercise, and after gait training, was at 70%. Supplemental O2 was again donned at 1.5L/min and O2 saturation increased to 99% within 30 seconds of pursed-lip breathing.   Follow Up Recommendations  SNF     Does the patient have the potential to tolerate intense rehabilitation     Barriers to Discharge        Equipment Recommendations  None recommended by PT    Recommendations for Other Services    Frequency Min 5X/week   Progress towards PT Goals Progress towards PT goals: Progressing toward goals  Plan Current plan remains appropriate    Precautions / Restrictions Precautions Precautions: Fall Restrictions Weight Bearing Restrictions: Yes RLE Weight Bearing: Weight bearing as tolerated   Pertinent Vitals/Pain See PT comments     Mobility  Bed Mobility Bed Mobility: Not assessed Details for Bed Mobility Assistance: Pt sitting up in straight back chair with falls camera on when PT entered. Pt reports she cannot sit in the recliner because it is so uncomfortable.  Transfers Transfers: Sit to Stand;Stand to Sit Sit to Stand: 4: Min guard;From chair/3-in-1;With upper extremity assist Stand to Sit: 4: Min guard;To chair/3-in-1;With upper extremity assist Details for Transfer Assistance: Pt was able to perform transfer to full stand with no physical assist. Pt pulled up on walker despite VC's to push from chair or bed rail which was close enough to act as an  armrest. Ambulation/Gait Ambulation/Gait Assistance: 4: Min guard Ambulation Distance (Feet): 175 Feet Assistive device: Rolling walker Ambulation/Gait Assistance Details: VC's for walker placement and improved posture. Pt ambulated on room air and did not report any SOB or dizziness/lightheadedness. Gait Pattern: Step-through pattern;Decreased stride length;Decreased stance time - right Gait velocity: decreased    Exercises Total Joint Exercises Hip ABduction/ADduction: 15 reps Long Arc Quad: 15 reps   PT Diagnosis:    PT Problem List:   PT Treatment Interventions:     PT Goals (current goals can now be found in the care plan section) Acute Rehab PT Goals Patient Stated Goal: stop the pain PT Goal Formulation: With patient Time For Goal Achievement: 08/18/13 Potential to Achieve Goals: Good  Visit Information  Last PT Received On: 08/14/13 Assistance Needed: +1 History of Present Illness: Pt is s/p R total hip revision direct anterior approach    Subjective Data  Subjective: "They just put this oxygen on me about 15 minutes ago. I don't know why." Patient Stated Goal: stop the pain   Cognition  Cognition Arousal/Alertness: Awake/alert Behavior During Therapy: WFL for tasks assessed/performed Overall Cognitive Status: Within Functional Limits for tasks assessed    Balance     End of Session PT - End of Session Equipment Utilized During Treatment: Gait belt Activity Tolerance: Patient tolerated treatment well Patient left: in chair;with chair alarm set;with call bell/phone within reach;Other (comment) (Falls camera on) Nurse Communication: Mobility status;Other (comment) (O2 sats after ambulation)   GP     Ruthann Cancer 08/14/2013, 3:04 PM  Ruthann Cancer, PT, DPT [phone]

## 2013-08-14 NOTE — Progress Notes (Signed)
Patient was sitting up in a chair in her room, fully dressed. She reported some discomfort due to sitting so long-nurse (Adrienne) notified. She was alert and oriented to self and able to respond appropriately to questions asked of her. No family was present at the bedside. SNF placement currently being sought for patient. SW has made several attempts to contact her daughter to no avail. Ongoing support to patient and family as needed. SW attempted to call her while present ([phone]) and left a message for her.  This is a NON-RELATED ADMISSION. Please call [phone] for any changes in condition or concerns.   [name] P. Lonon,LCSW [phone])

## 2013-08-14 NOTE — Progress Notes (Signed)
Chaplain's Note: Upon arrival, pt ambulated from rest room to chair with use of walker and nurse, Hansel Starling close by, assisted in chair.  Nurse assisted pt with putting on oxygen.  Pt denied having pain; pt stated she was not really sure of how she felt.  Pt welcomed having prayer.   Chaplain provided supportive spiritual presence, expressed words of care and had prayer of comfort/peace.  Willa S. Court Joy., [facility]

## 2013-08-14 NOTE — Progress Notes (Signed)
Pt O2 sats measured at 79 following ambulation with PT. Pt now on 2L O2 and at 95%. MD made aware. Will continue to monitor.

## 2013-08-14 NOTE — OR Nursing (Signed)
Late entry for patient depature based on anesthesia records.

## 2013-08-15 DIAGNOSIS — Z471 Aftercare following joint replacement surgery: Secondary | ICD-10-CM | POA: Diagnosis not present

## 2013-08-15 DIAGNOSIS — R262 Difficulty in walking, not elsewhere classified: Secondary | ICD-10-CM | POA: Diagnosis not present

## 2013-08-15 DIAGNOSIS — S43006A Unspecified dislocation of unspecified shoulder joint, initial encounter: Secondary | ICD-10-CM

## 2013-08-15 DIAGNOSIS — R9431 Abnormal electrocardiogram [ECG] [EKG]: Secondary | ICD-10-CM | POA: Diagnosis not present

## 2013-08-15 DIAGNOSIS — R29818 Other symptoms and signs involving the nervous system: Secondary | ICD-10-CM | POA: Diagnosis not present

## 2013-08-15 DIAGNOSIS — Z96649 Presence of unspecified artificial hip joint: Secondary | ICD-10-CM | POA: Diagnosis not present

## 2013-08-15 DIAGNOSIS — I10 Essential (primary) hypertension: Secondary | ICD-10-CM | POA: Diagnosis not present

## 2013-08-15 DIAGNOSIS — S73006A Unspecified dislocation of unspecified hip, initial encounter: Secondary | ICD-10-CM

## 2013-08-15 DIAGNOSIS — E41 Nutritional marasmus: Secondary | ICD-10-CM | POA: Diagnosis not present

## 2013-08-15 DIAGNOSIS — F329 Major depressive disorder, single episode, unspecified: Secondary | ICD-10-CM | POA: Diagnosis not present

## 2013-08-15 DIAGNOSIS — F0280 Dementia in other diseases classified elsewhere without behavioral disturbance: Secondary | ICD-10-CM | POA: Diagnosis not present

## 2013-08-15 DIAGNOSIS — J189 Pneumonia, unspecified organism: Secondary | ICD-10-CM

## 2013-08-15 DIAGNOSIS — R279 Unspecified lack of coordination: Secondary | ICD-10-CM | POA: Diagnosis not present

## 2013-08-15 NOTE — Progress Notes (Signed)
LCSW met with patient and her daughter/POA, [name] to complete REVOCATION paperwork. Patient/family revoked her hospice benefit today to utilize her Medicare benefit to cover rehabilitation services at a SNF ([facility]). Patient revocation effective today 08/15/13.

## 2013-08-15 NOTE — Progress Notes (Addendum)
D/C summarry updated for possible SNF placement  Will also order inpatient rehab consult as a possibility

## 2013-08-15 NOTE — Progress Notes (Signed)
Room  [facility] RN Visit-R.Dalinda Heidt RN  NON RELATED AND NON COVERED ADMISSION - [facility] diagnosis is depressive disorder.   Pt is DNR code.    Pt alert, confused, sitting up in lounge chair, dressed for discharge to SNF rehab.  Pt had complaints of R/hip pain stating not too bad at time of visit.  Dtr present and [facility] SW [place]. Patient's home medication list is on shadow chart.   Pt revoking [facility] services to enter SNF for rehab x 2 weeks per dtr.  Dtr states then pt wants to return to [facility] and will contact [facility] for re-admission to [facility].    Please call [facility] @ [phone]- ask for RN Liaison or after hours,ask for on-call RN with any hospice needs.   Thank you.  Joneen Boers, RN  [facility]  Hospice Liaison  [phone])

## 2013-08-15 NOTE — Progress Notes (Signed)
Patient discharged to [facility]. Report called to Ander Slade, RN at [facility]. Patients IV was removed and patient left unit in a stable condition via wheelchair.

## 2013-08-15 NOTE — Progress Notes (Signed)
Thank you for consult on [name]. Patient is a resident of ALF and will require 24 hours supervision past discharge due to safety concerns.  She is progressing well with therapies would recommend follow up therapies at SNF as recommended by PT. Note that bed is available today. Will defer CIR consult.

## 2013-08-15 NOTE — Progress Notes (Signed)
NP notified regarding pt's BP 187/81 HR 111.  Pt lost IV access for IV Hydralazine and refused to have it restart.  Daughter [name] is also aware of BP.  No order received.

## 2013-08-15 NOTE — Progress Notes (Signed)
[facility] PROGRESS NOTE  [name] YNW:[id number] DOB: 08-14-1916 DOA: 08/07/2013 PCP: Bufford Spikes, DO  Assessment/Plan: 1-PNA: Patient with cough, chest x ray with : Patchy infiltrate within the right lung with left lower lobe consolidation and associated effusion. Received cefepime for 4 days. Chest x ray with improved infiltrates decrease pleural effusion. Received 3 days of Vancomycin. Will provide 5 days of Levaquin.   2- R hip dislocation; s/p THR 11/11; per  ortho  3-Failure to thrive, alzheimers dementia  -stable.  4- Anemia; no s/s of acute bleeding; cont monitor. Hb  at 11.7 5-Hypoxemia: Acute respiratory failure; Acute Diastolic HF exacerbation; multifactorial secondary to pna plus component  HF. ECHO show hypokinesis. Unclear if new. Discussed results with daughter, will treat for heart failure. No aggressive testing.  -Transition to oral lasix 20 mg daily.  -Need B-met to follow renal function at facility.  -Need oxygen on ambulation.    6-Hypomagnesia. Replaced. Mg at 1.9.  7-Hypertension: continue with lasix. Could consider oral hydralazine if BP increases.  8-Transient tachycardia: resolved. Monitor. HR at 70.   Thank you for the consult. Patient stable to be transfer to SNF vs CIR.   Code Status: DNR Family Communication:  Disposition Plan: SNF likely Monday.    Procedures: right THR revision   Antibiotics:  Vancomycin 11-13  Cefepime 11-13  HPI/Subjective: Sitting in the chair no complaints. No chest pain.     Objective: Filed Vitals:   08/15/13 1042  BP: 138/81  Pulse: 72  Temp:   Resp: 18    Intake/Output Summary (Last 24 hours) at 08/15/13 1050 Last data filed at 08/14/13 1700  Gross per 24 hour  Intake    400 ml  Output      0 ml  Net    400 ml   Filed Weights   08/07/13 2012  Weight: 41.731 kg (92 lb)    Exam:   General:  No acute distress.   Cardiovascular: S 1, S 2 RRR.   Respiratory: no significant  crackles.   Abdomen: BS present, soft, nt  Musculoskeletal: trace edema.   Data Reviewed: Basic Metabolic Panel:  Recent Labs Lab 08/10/13 0725 08/11/13 0602 08/12/13 0421 08/13/13 0905 08/14/13 0625  NA 139 141 143 142 143  K 4.2 4.0 3.9 3.4* 3.6  CL 106 107 105 102 101  CO2 25 26 27 29  33*  GLUCOSE 93 109* 87 103* 104*  BUN 15 16 14 11 12   CREATININE 0.81 0.72 0.64 0.57 0.65  CALCIUM 8.5 7.9* 8.8 9.1 9.4  MG  --   --  1.4*  --  1.9   Liver Function Tests: No results found for this basename: AST, ALT, ALKPHOS, BILITOT, PROT, ALBUMIN,  in the last 168 hours No results found for this basename: LIPASE, AMYLASE,  in the last 168 hours No results found for this basename: AMMONIA,  in the last 168 hours CBC:  Recent Labs Lab 08/09/13 0428 08/10/13 0725 08/11/13 0602 08/12/13 0421  WBC 6.3 8.6 7.6 7.4  HGB 10.2* 11.1* 9.9* 11.7*  HCT 32.3* 34.6* 30.6* 35.5*  MCV 91.8 91.1 89.7 89.6  PLT 175 210 197 256   Cardiac Enzymes:  Recent Labs Lab 08/11/13 1230  TROPONINI <0.30   BNP (last 3 results)  Recent Labs  08/09/13 0430  PROBNP 22853.0*   CBG: No results found for this basename: GLUCAP,  in the last 168 hours  Recent Results (from the past 240 hour(s))  SURGICAL PCR SCREEN  Status: None   Collection Time    08/08/13 11:48 AM      Result Value Range Status   MRSA, PCR NEGATIVE  NEGATIVE Final   Staphylococcus aureus NEGATIVE  NEGATIVE Final   Comment:            The Xpert SA Assay (FDA     approved for NASAL specimens     in patients over 76 years of age),     is one component of     a comprehensive surveillance     program.  Test performance has     been validated by [employer] for patients greater     than or equal to 50 year old.     It is not intended     to diagnose infection nor to     guide or monitor treatment.     Studies: No results found.  Scheduled Meds: . aspirin EC  325 mg Oral BID PC  . clonazePAM  1 mg Oral QHS  .  docusate sodium  100 mg Oral BID  . feeding supplement (ENSURE COMPLETE)  237 mL Oral BID BM  . ferrous sulfate  325 mg Oral TID PC  . furosemide  20 mg Oral Daily  . levofloxacin  250 mg Oral Daily  . tiZANidine  2 mg Oral BID  . triamcinolone cream  1 application Topical BID   Continuous Infusions:   Principal Problem:   Failed total hip arthroplasty with dislocation Active Problems:   Alzheimer's disease   HYPERTENSION   ABNORMAL EKG   Cough   PNA (pneumonia)   Severe malnutrition    Time spent: 25 minutes.     [name]  [facility] Pager [phone]. If 7PM-7AM, please contact night-coverage at [email or web address], password [facility] 08/15/2013, 10:50 AM  LOS: 8 days

## 2013-08-16 NOTE — Progress Notes (Signed)
Clinical social worker assisted with patient discharge to skilled nursing facility, [facility].  CSW addressed all family questions and concerns. CSW copied chart and added all important documents. CSW also set up patient transportation with [facility]. Clinical Social Worker will sign off for now as social work intervention is no longer needed.   [name], MSW, LCSWA [phone] 

## 2013-08-31 DIAGNOSIS — Z96649 Presence of unspecified artificial hip joint: Secondary | ICD-10-CM | POA: Diagnosis not present

## 2013-08-31 DIAGNOSIS — M6281 Muscle weakness (generalized): Secondary | ICD-10-CM | POA: Diagnosis not present

## 2013-08-31 DIAGNOSIS — R269 Unspecified abnormalities of gait and mobility: Secondary | ICD-10-CM | POA: Diagnosis not present

## 2013-08-31 DIAGNOSIS — R279 Unspecified lack of coordination: Secondary | ICD-10-CM | POA: Diagnosis not present

## 2013-08-31 DIAGNOSIS — Z471 Aftercare following joint replacement surgery: Secondary | ICD-10-CM | POA: Diagnosis not present

## 2013-09-01 DIAGNOSIS — R279 Unspecified lack of coordination: Secondary | ICD-10-CM | POA: Diagnosis not present

## 2013-09-01 DIAGNOSIS — Z96649 Presence of unspecified artificial hip joint: Secondary | ICD-10-CM | POA: Diagnosis not present

## 2013-09-01 DIAGNOSIS — M6281 Muscle weakness (generalized): Secondary | ICD-10-CM | POA: Diagnosis not present

## 2013-09-01 DIAGNOSIS — Z471 Aftercare following joint replacement surgery: Secondary | ICD-10-CM | POA: Diagnosis not present

## 2013-09-01 DIAGNOSIS — R269 Unspecified abnormalities of gait and mobility: Secondary | ICD-10-CM | POA: Diagnosis not present

## 2013-09-04 ENCOUNTER — Ambulatory Visit (INDEPENDENT_AMBULATORY_CARE_PROVIDER_SITE_OTHER): Payer: Medicare Other | Admitting: Internal Medicine

## 2013-09-04 ENCOUNTER — Encounter: Payer: Self-pay | Admitting: Internal Medicine

## 2013-09-04 VITALS — BP 124/76 | HR 64 | Temp 97.3°F | Wt 97.5 lb

## 2013-09-04 DIAGNOSIS — R269 Unspecified abnormalities of gait and mobility: Secondary | ICD-10-CM | POA: Diagnosis not present

## 2013-09-04 DIAGNOSIS — M6281 Muscle weakness (generalized): Secondary | ICD-10-CM | POA: Diagnosis not present

## 2013-09-04 DIAGNOSIS — S43006A Unspecified dislocation of unspecified shoulder joint, initial encounter: Secondary | ICD-10-CM | POA: Diagnosis not present

## 2013-09-04 DIAGNOSIS — R279 Unspecified lack of coordination: Secondary | ICD-10-CM | POA: Diagnosis not present

## 2013-09-04 DIAGNOSIS — Z471 Aftercare following joint replacement surgery: Secondary | ICD-10-CM | POA: Diagnosis not present

## 2013-09-04 DIAGNOSIS — F0280 Dementia in other diseases classified elsewhere without behavioral disturbance: Secondary | ICD-10-CM

## 2013-09-04 DIAGNOSIS — S73004S Unspecified dislocation of right hip, sequela: Secondary | ICD-10-CM

## 2013-09-04 DIAGNOSIS — R0989 Other specified symptoms and signs involving the circulatory and respiratory systems: Secondary | ICD-10-CM

## 2013-09-04 DIAGNOSIS — R0600 Dyspnea, unspecified: Secondary | ICD-10-CM

## 2013-09-04 DIAGNOSIS — IMO0002 Reserved for concepts with insufficient information to code with codable children: Secondary | ICD-10-CM | POA: Diagnosis not present

## 2013-09-04 DIAGNOSIS — R0609 Other forms of dyspnea: Secondary | ICD-10-CM

## 2013-09-04 DIAGNOSIS — Z96649 Presence of unspecified artificial hip joint: Secondary | ICD-10-CM | POA: Diagnosis not present

## 2013-09-04 DIAGNOSIS — R609 Edema, unspecified: Secondary | ICD-10-CM

## 2013-09-04 DIAGNOSIS — R627 Adult failure to thrive: Secondary | ICD-10-CM

## 2013-09-04 MED ORDER — FUROSEMIDE 20 MG PO TABS: 20.0000 mg | ORAL_TABLET | Freq: Two times a day (BID) | ORAL | Status: DC

## 2013-09-04 NOTE — Progress Notes (Signed)
Patient ID: [name], female   DOB: 07/21/1916, 79 y.o.   MRN: [id number]   Location:  [facility]  Code Status: DNR  Allergies  Allergen Reactions  . Sulfa Antibiotics Shortness Of Breath    Worsens asthma  . Clonidine Derivatives   . Lactose Intolerance (Gi)   . Verapamil     [facility] MAR    Chief Complaint  Patient presents with  . Acute Visit    bilateral leg swelling x 10 days  . other    RT arm still in bad shape (per pt) she is in therapy x 1 week    HPI: Patient is a 79 y.o. white female seen in the office today for 10 days of b/l leg swelling.  At night she can hardly get her pants off.  Is also more short of breath.  Legs are red and tender, also.  Swelling is mostly in the calves.  Says she can't take more diuretics--can't get through supper w/o getting up to urinate.    Also having headaches which she never did before.    Right arm still in bad shape.  Getting weekly therapy.  Looks wonderful but doesn't feel that way.  Review of Systems:  Review of Systems  Constitutional: Positive for weight loss. Negative for fever, chills and malaise/fatigue.  HENT: Positive for hearing loss. Negative for congestion.   Eyes: Positive for blurred vision.  Respiratory: Positive for shortness of breath. Negative for cough.   Cardiovascular: Positive for palpitations and leg swelling. Negative for chest pain.  Gastrointestinal: Negative for heartburn, constipation, blood in stool and melena.  Genitourinary: Positive for frequency. Negative for dysuria.  Musculoskeletal: Positive for back pain, joint pain, myalgias and neck pain. Negative for falls.  Skin: Negative for itching and rash.  Neurological: Positive for headaches. Negative for loss of consciousness.  Psychiatric/Behavioral: Positive for memory loss. Negative for depression.     Past Medical History  Diagnosis Date  . COPD (chronic obstructive pulmonary disease)   .  Hypertension   . Oral aphthae   . Orthostatic hypotension   . Senile osteoporosis   . Atrophy of vulva   . Unspecified urinary incontinence   . Urinary frequency   . Unspecified vitamin D deficiency   . Anxiety state, unspecified   . Restless legs syndrome (RLS)   . Unspecified disorder of kidney and ureter   . Atrioventricular block, unspecified   . Closed dislocation of shoulder, unspecified site   . Anemia, unspecified   . Altered mental status   . Lumbago   . Transient disorder of initiating or maintaining sleep   . Cellulitis and abscess of unspecified site   . Sebaceous cyst   . Insomnia, unspecified   . Dementia in conditions classified elsewhere without behavioral disturbance   . Depressive disorder, not elsewhere classified   . Abnormality of gait   . Adult failure to thrive   . Headache(784.0)   . Type II or unspecified type diabetes mellitus without mention of complication, not stated as uncontrolled   . Gout, unspecified   . Extrinsic asthma, unspecified   . Chronic kidney disease, stage I   . Osteoarthrosis, unspecified whether generalized or localized, unspecified site   . Spinal stenosis, unspecified region other than cervical   . Edema   . Tachycardia, unspecified     Past Surgical History  Procedure Laterality Date  . Hip surgery      (R) hip replacement  .  Cesarean section    . Abdominal hysterectomy      partial  . Back surgery      fusion  . Breast surgery      bilateral breast reduction  . Eye surgery      bilateral cataract  . Basal cell carcinoma excision    . Basal cell carcinoma excision      face  . Hip closed reduction Right 08/07/2013    Procedure: ATTEMPTED CLOSED MANIPULATION HIP;  Surgeon: Jodi Marble, MD;  Location[facility] OR;  Service: Orthopedics;  Laterality: Right;  . Total hip revision Right 08/08/2013    Procedure: TOTAL HIP REVISION- right;  Surgeon: Velna Ochs, MD;  Location: [facility] OR;  Service: Orthopedics;   Laterality: Right;    Social History:   reports that she has never smoked. She does not have any smokeless tobacco history on file. She reports that she does not drink alcohol or use illicit drugs.  Family History  Problem Relation Age of Onset  . Diabetes Mother   . Diabetes Father   . Stroke Sister   . Heart attack Brother     Medications: Patient's Medications  New Prescriptions   AMLODIPINE (NORVASC) 10 MG TABLET    Take 1 tablet (10 mg total) by mouth daily.   LISINOPRIL (PRINIVIL,ZESTRIL) 20 MG TABLET    Take 1 tablet (20 mg total) by mouth daily.  Previous Medications   FUROSEMIDE (LASIX) 20 MG TABLET    Take 20 mg by mouth every other day.   POTASSIUM CHLORIDE SA (K-DUR,KLOR-CON) 20 MEQ TABLET    Take 20 mEq by mouth every other day.   SENNA-DOCUSATE (SENOKOT-S) 8.6-50 MG PER TABLET    Take 1 tablet by mouth daily.   TIZANIDINE (ZANAFLEX) 2 MG TABLET    Take 2 mg by mouth daily.    TRIAMCINOLONE CREAM (KENALOG) 0.5 %    Apply 1 application topically 2 (two) times daily. To rash on legs  Modified Medications   Modified Medication Previous Medication   AMBULATORY NON FORMULARY MEDICATION AMBULATORY NON FORMULARY MEDICATION      ORDERS: 1.) Take Lasix 20 mg and Potassium 20 meq daily on 11/21/2013, 11/22/2013,11/23/2013, 11/24/2013, then return to every other day for both. 2.) Continue daily weights and send the record of them to the office for the past 2 weeks    ORDERS: 1.) Take Lasix 20 mg and Potassium 20 meq daily on 11/21/2013, 11/22/2013,11/23/2013, 11/24/2013, then return to every other day for both. 2.) Continue daily weights and send the record of them to the office for the past 2 weeks   CLONAZEPAM (KLONOPIN) 1 MG TABLET clonazePAM (KLONOPIN) 1 MG tablet      Take 1 tablet (1 mg total) by mouth at bedtime.    Take 1 tablet (1 mg total) by mouth at bedtime.   HYDROCODONE-ACETAMINOPHEN (NORCO/VICODIN) 5-325 MG PER TABLET HYDROcodone-acetaminophen (NORCO/VICODIN) 5-325 MG per  tablet      Take 1 tablet by mouth 2 (two) times daily.    Take 1 tablet by mouth 2 (two) times daily.  Discontinued Medications   ACETAMINOPHEN (TYLENOL) 325 MG TABLET    Take 2 tablets (650 mg total) by mouth every 6 (six) hours as needed for mild pain (or Fever >/= 101).   ASPIRIN EC 325 MG EC TABLET    Take 1 tablet (325 mg total) by mouth 2 (two) times daily after a meal.   FEEDING SUPPLEMENT, ENSURE COMPLETE, (ENSURE COMPLETE) LIQD  Take 237 mLs by mouth 2 (two) times daily between meals.   FUROSEMIDE (LASIX) 20 MG TABLET    Take 1 tablet (20 mg total) by mouth daily.   FUROSEMIDE (LASIX) 20 MG TABLET    Take 1 tablet (20 mg total) by mouth 2 (two) times daily. At breakfast and lunch   HYDROCODONE-ACETAMINOPHEN (NORCO/VICODIN) 5-325 MG PER TABLET    Take 1 tablet by mouth every 6 (six) hours as needed for moderate pain.   LEVOFLOXACIN (LEVAQUIN) 250 MG TABLET    Take 1 tablet (250 mg total) by mouth daily.   POTASSIUM CHLORIDE SA (K-DUR,KLOR-CON) 20 MEQ TABLET    Take 1 tablet (20 mEq total) by mouth daily.     Physical Exam: Filed Vitals:   09/04/13 1141  BP: 124/76  Pulse: 64  Temp: 97.3 F (36.3 C)  TempSrc: Oral  Weight: 97 lb 8 oz (44.226 kg)   Physical Exam  Constitutional: No distress.  Frail pleasant white female, ambulates slowly with walker  HENT:  Head: Normocephalic and atraumatic.  Neck: No JVD present.  Cardiovascular: Normal rate, regular rhythm, normal heart sounds and intact distal pulses.   2+ edema of bilateral calves, appears slightly dyspneic on exertion  Pulmonary/Chest: Effort normal and breath sounds normal. No respiratory distress. She has no rales.  Musculoskeletal:  Ambulates steadily today with walker  Neurological: She is alert.     Labs reviewed: Basic Metabolic Panel:  Recent Labs  16/10/96 0602 08/12/13 0421  08/14/13 0625 09/05/13 0915 11/02/13 1112  NA 141 143  < > 143 142 143  K 4.0 3.9  < > 3.6 4.3 SPECIMEN/CONTAINER TYPE  INAPPROPRIATE FOR ORDERED TEST, UNABLE TO PERFORM  CL 107 105  < > 101 101 96  CO2 26 27  < > 33* 24 27  GLUCOSE 109* 87  < > 104* 89 101*  BUN 16 14  < > 12 14 19   CREATININE 0.72 0.64  < > 0.65 0.71 0.67  CALCIUM 7.9* 8.8  < > 9.4 9.1 SPECIMEN/CONTAINER TYPE INAPPROPRIATE FOR ORDERED TEST, UNABLE TO PERFORM  MG  --  1.4*  --  1.9  --   --   < > = values in this interval not displayed.CBC:  Recent Labs  08/07/13 2149  08/11/13 0602 08/12/13 0421 11/02/13 1112  WBC 6.5  < > 7.6 7.4 8.0  NEUTROABS 4.6  --   --   --  5.8  HGB 11.6*  < > 9.9* 11.7* 14.8  HCT 34.8*  < > 30.6* 35.5* 42.9  MCV 89.2  < > 89.7 89.6 85.6  PLT 219  < > 197 256 131*  < > = values in this interval not displayed.  Assessment/Plan 1. Edema -new onset and severe in calves only -some associated erythema, but seems primarily worsened venous stasis -also more sob suggesting chf exacerbation though lungs clear - Brain Natriuretic Peptide - Basic metabolic panel - furosemide (LASIX) 20 MG tablet; Take 1 tablet (20 mg total) by mouth 2 (two) times daily. At breakfast and lunch  Dispense: 60 tablet; Refill: 3 -just completed abx for uti  2. DOE (dyspnea on exertion) - had some difficulty with her heart during hospitalization for hip surgery - Brain Natriuretic Peptide - Basic metabolic panel - furosemide (LASIX) 20 MG tablet; Take 1 tablet (20 mg total) by mouth 2 (two) times daily. At breakfast and lunch  Dispense: 60 tablet; Refill: 3  3. Hip dislocation, right, sequela -s/p repair of hip--walking  quite well, in my opinion with the walker--working with therapy  4. Closed dislocation of shoulder, unspecified site -continues to have some difficulty with her right shoulder, but working with PT, also has muscle relaxant for some of her neck and shoulder pain  5. Alzheimer's disease -mentally much clearer today, as well -moderate stage;  Seemed worse transiently due to her severe pain in her hip and shoulder,  and pain meds  6. Failure to thrive in adult -losing weight related to her dementia and minimal appetite--had been quite depressed, as well, but this seems improved  Labs/tests ordered:  Bmp, bnp today to assess K and chf Next appt:  2 wks re: swelling of legs

## 2013-09-05 ENCOUNTER — Other Ambulatory Visit: Payer: Medicare Other

## 2013-09-05 DIAGNOSIS — M6281 Muscle weakness (generalized): Secondary | ICD-10-CM | POA: Diagnosis not present

## 2013-09-05 DIAGNOSIS — Z96649 Presence of unspecified artificial hip joint: Secondary | ICD-10-CM | POA: Diagnosis not present

## 2013-09-05 DIAGNOSIS — R269 Unspecified abnormalities of gait and mobility: Secondary | ICD-10-CM | POA: Diagnosis not present

## 2013-09-05 DIAGNOSIS — Z471 Aftercare following joint replacement surgery: Secondary | ICD-10-CM | POA: Diagnosis not present

## 2013-09-05 DIAGNOSIS — R279 Unspecified lack of coordination: Secondary | ICD-10-CM | POA: Diagnosis not present

## 2013-09-06 ENCOUNTER — Other Ambulatory Visit: Payer: Self-pay | Admitting: *Deleted

## 2013-09-06 DIAGNOSIS — M6281 Muscle weakness (generalized): Secondary | ICD-10-CM | POA: Diagnosis not present

## 2013-09-06 DIAGNOSIS — R269 Unspecified abnormalities of gait and mobility: Secondary | ICD-10-CM | POA: Diagnosis not present

## 2013-09-06 DIAGNOSIS — Z471 Aftercare following joint replacement surgery: Secondary | ICD-10-CM | POA: Diagnosis not present

## 2013-09-06 DIAGNOSIS — Z96649 Presence of unspecified artificial hip joint: Secondary | ICD-10-CM | POA: Diagnosis not present

## 2013-09-06 DIAGNOSIS — R279 Unspecified lack of coordination: Secondary | ICD-10-CM | POA: Diagnosis not present

## 2013-09-06 DIAGNOSIS — R609 Edema, unspecified: Secondary | ICD-10-CM

## 2013-09-06 LAB — BASIC METABOLIC PANEL
BUN/Creatinine Ratio: 20 (ref 11–26)
BUN: 14 mg/dL (ref 10–36)
CO2: 24 mmol/L (ref 18–29)
Calcium: 9.1 mg/dL (ref 8.6–10.2)
Chloride: 101 mmol/L (ref 97–108)
Creatinine, Ser: 0.71 mg/dL (ref 0.57–1.00)
GFR calc Af Amer: 84 mL/min/{1.73_m2} (ref >59)
GFR calc non Af Amer: 73 mL/min/{1.73_m2} (ref >59)
Glucose: 89 mg/dL (ref 65–99)
Potassium: 4.3 mmol/L (ref 3.5–5.2)
Sodium: 142 mmol/L (ref 134–144)

## 2013-09-06 LAB — BRAIN NATRIURETIC PEPTIDE

## 2013-09-06 MED ORDER — AMBULATORY NON FORMULARY MEDICATION: Status: DC

## 2013-09-07 ENCOUNTER — Other Ambulatory Visit: Payer: Self-pay | Admitting: *Deleted

## 2013-09-07 DIAGNOSIS — R609 Edema, unspecified: Secondary | ICD-10-CM | POA: Diagnosis not present

## 2013-09-07 MED ORDER — CLONAZEPAM 1 MG PO TABS: 1.0000 mg | ORAL_TABLET | Freq: Every day | ORAL | Status: DC

## 2013-09-08 DIAGNOSIS — Z96649 Presence of unspecified artificial hip joint: Secondary | ICD-10-CM | POA: Diagnosis not present

## 2013-09-08 DIAGNOSIS — R269 Unspecified abnormalities of gait and mobility: Secondary | ICD-10-CM | POA: Diagnosis not present

## 2013-09-08 DIAGNOSIS — R279 Unspecified lack of coordination: Secondary | ICD-10-CM | POA: Diagnosis not present

## 2013-09-08 DIAGNOSIS — Z471 Aftercare following joint replacement surgery: Secondary | ICD-10-CM | POA: Diagnosis not present

## 2013-09-08 DIAGNOSIS — M6281 Muscle weakness (generalized): Secondary | ICD-10-CM | POA: Diagnosis not present

## 2013-09-11 DIAGNOSIS — R279 Unspecified lack of coordination: Secondary | ICD-10-CM | POA: Diagnosis not present

## 2013-09-11 DIAGNOSIS — Z96649 Presence of unspecified artificial hip joint: Secondary | ICD-10-CM | POA: Diagnosis not present

## 2013-09-11 DIAGNOSIS — Z471 Aftercare following joint replacement surgery: Secondary | ICD-10-CM | POA: Diagnosis not present

## 2013-09-11 DIAGNOSIS — R269 Unspecified abnormalities of gait and mobility: Secondary | ICD-10-CM | POA: Diagnosis not present

## 2013-09-11 DIAGNOSIS — M6281 Muscle weakness (generalized): Secondary | ICD-10-CM | POA: Diagnosis not present

## 2013-09-12 DIAGNOSIS — Z471 Aftercare following joint replacement surgery: Secondary | ICD-10-CM | POA: Diagnosis not present

## 2013-09-12 DIAGNOSIS — M6281 Muscle weakness (generalized): Secondary | ICD-10-CM | POA: Diagnosis not present

## 2013-09-12 DIAGNOSIS — R279 Unspecified lack of coordination: Secondary | ICD-10-CM | POA: Diagnosis not present

## 2013-09-12 DIAGNOSIS — Z96649 Presence of unspecified artificial hip joint: Secondary | ICD-10-CM | POA: Diagnosis not present

## 2013-09-12 DIAGNOSIS — R269 Unspecified abnormalities of gait and mobility: Secondary | ICD-10-CM | POA: Diagnosis not present

## 2013-09-13 DIAGNOSIS — R279 Unspecified lack of coordination: Secondary | ICD-10-CM | POA: Diagnosis not present

## 2013-09-13 DIAGNOSIS — M6281 Muscle weakness (generalized): Secondary | ICD-10-CM | POA: Diagnosis not present

## 2013-09-13 DIAGNOSIS — R269 Unspecified abnormalities of gait and mobility: Secondary | ICD-10-CM | POA: Diagnosis not present

## 2013-09-13 DIAGNOSIS — Z96649 Presence of unspecified artificial hip joint: Secondary | ICD-10-CM | POA: Diagnosis not present

## 2013-09-13 DIAGNOSIS — Z471 Aftercare following joint replacement surgery: Secondary | ICD-10-CM | POA: Diagnosis not present

## 2013-09-15 ENCOUNTER — Ambulatory Visit (INDEPENDENT_AMBULATORY_CARE_PROVIDER_SITE_OTHER): Payer: Medicare Other | Admitting: Internal Medicine

## 2013-09-15 ENCOUNTER — Encounter: Payer: Self-pay | Admitting: Internal Medicine

## 2013-09-15 VITALS — BP 122/70 | HR 52 | Temp 97.6°F | Wt 91.4 lb

## 2013-09-15 DIAGNOSIS — F0280 Dementia in other diseases classified elsewhere without behavioral disturbance: Secondary | ICD-10-CM

## 2013-09-15 DIAGNOSIS — R279 Unspecified lack of coordination: Secondary | ICD-10-CM | POA: Diagnosis not present

## 2013-09-15 DIAGNOSIS — K5909 Other constipation: Secondary | ICD-10-CM

## 2013-09-15 DIAGNOSIS — M6281 Muscle weakness (generalized): Secondary | ICD-10-CM | POA: Diagnosis not present

## 2013-09-15 DIAGNOSIS — Z96649 Presence of unspecified artificial hip joint: Secondary | ICD-10-CM | POA: Diagnosis not present

## 2013-09-15 DIAGNOSIS — R0600 Dyspnea, unspecified: Secondary | ICD-10-CM

## 2013-09-15 DIAGNOSIS — J45909 Unspecified asthma, uncomplicated: Secondary | ICD-10-CM | POA: Diagnosis not present

## 2013-09-15 DIAGNOSIS — K5900 Constipation, unspecified: Secondary | ICD-10-CM

## 2013-09-15 DIAGNOSIS — R0609 Other forms of dyspnea: Secondary | ICD-10-CM

## 2013-09-15 DIAGNOSIS — R269 Unspecified abnormalities of gait and mobility: Secondary | ICD-10-CM | POA: Diagnosis not present

## 2013-09-15 DIAGNOSIS — R627 Adult failure to thrive: Secondary | ICD-10-CM

## 2013-09-15 DIAGNOSIS — Z471 Aftercare following joint replacement surgery: Secondary | ICD-10-CM | POA: Diagnosis not present

## 2013-09-15 DIAGNOSIS — M4800 Spinal stenosis, site unspecified: Secondary | ICD-10-CM

## 2013-09-15 DIAGNOSIS — Z006 Encounter for examination for normal comparison and control in clinical research program: Secondary | ICD-10-CM | POA: Diagnosis not present

## 2013-09-15 DIAGNOSIS — R609 Edema, unspecified: Secondary | ICD-10-CM | POA: Diagnosis not present

## 2013-09-15 NOTE — Patient Instructions (Signed)
D/c bumetanide D/c lasix 20mg  bid Begin lasix 20mg  every other day D/c kcl daily Begin KCl every other day Begin Senna S one tab once a day F/u in 2 months here

## 2013-09-15 NOTE — Progress Notes (Signed)
Patient ID: [name], female   DOB: 1916-04-28, 79 y.o.   MRN: [id number]   Location:  [facility]   Allergies  Allergen Reactions  . Sulfa Antibiotics Shortness Of Breath    Worsens asthma  . Clonidine Derivatives   . Lactose Intolerance (Gi)     Chief Complaint  Patient presents with  . Follow-up    Bp has been up/down, ? medication  . other    decliens any vaccines or Dexa scans    HPI: Patient is a 79 y.o. white female seen in the office today for f/u of chronic conditions.   Had been briefly on hospice at home Then fell and hip dislocated To hospital and meds changed To rehab and meds changed--off lasix--lasix 20mg  qod  Has to get up to urinate in the nighttime and missed dinner a couple of times BNP was recently redrawn, but has now been on the lasix.   Breathing better, but once in a while when nervous breathing gets heavy Right arm is now acting up.  Still doing PT for the right arm.   Review of Systems:  Review of Systems  Constitutional: Positive for malaise/fatigue. Negative for fever and chills.  HENT: Negative for congestion.   Eyes: Negative for blurred vision.  Respiratory: Negative for cough and shortness of breath.   Cardiovascular: Positive for leg swelling. Negative for chest pain and palpitations.       Still some swelling of calves  Gastrointestinal: Positive for constipation. Negative for nausea, blood in stool and melena.  Genitourinary: Negative for dysuria.  Musculoskeletal: Negative for falls.  Skin: Negative for rash.  Neurological: Positive for weakness. Negative for dizziness, focal weakness, loss of consciousness and headaches.  Endo/Heme/Allergies: Does not bruise/bleed easily.  Psychiatric/Behavioral: Positive for depression and memory loss.       Mood and memory better recently     Past Medical History  Diagnosis Date  . COPD (chronic obstructive pulmonary disease)   . Hypertension   .  Oral aphthae   . Orthostatic hypotension   . Senile osteoporosis   . Atrophy of vulva   . Unspecified urinary incontinence   . Urinary frequency   . Unspecified vitamin D deficiency   . Anxiety state, unspecified   . Restless legs syndrome (RLS)   . Unspecified disorder of kidney and ureter   . Atrioventricular block, unspecified   . Closed dislocation of shoulder, unspecified site   . Anemia, unspecified   . Altered mental status   . Lumbago   . Transient disorder of initiating or maintaining sleep   . Cellulitis and abscess of unspecified site   . Sebaceous cyst   . Insomnia, unspecified   . Dementia in conditions classified elsewhere without behavioral disturbance(294.10)   . Depressive disorder, not elsewhere classified   . Abnormality of gait   . Adult failure to thrive   . Headache(784.0)   . Type II or unspecified type diabetes mellitus without mention of complication, not stated as uncontrolled   . Gout, unspecified   . Extrinsic asthma, unspecified   . Chronic kidney disease, stage I   . Osteoarthrosis, unspecified whether generalized or localized, unspecified site   . Spinal stenosis, unspecified region other than cervical   . Edema   . Tachycardia, unspecified     Past Surgical History  Procedure Laterality Date  . Hip surgery      (R) hip replacement  . Cesarean section    .  Abdominal hysterectomy      partial  . Back surgery      fusion  . Breast surgery      bilateral breast reduction  . Eye surgery      bilateral cataract  . Basal cell carcinoma excision    . Basal cell carcinoma excision      face  . Hip closed reduction Right 08/07/2013    Procedure: ATTEMPTED CLOSED MANIPULATION HIP;  Surgeon: Jodi Marble, MD;  Location: [facility] OR;  Service: Orthopedics;  Laterality: Right;  . Total hip revision Right 08/08/2013    Procedure: TOTAL HIP REVISION- right;  Surgeon: Velna Ochs, MD;  Location: [facility] OR;  Service: Orthopedics;  Laterality: Right;      Social History:   reports that she has never smoked. She does not have any smokeless tobacco history on file. She reports that she does not drink alcohol or use illicit drugs.  Family History  Problem Relation Age of Onset  . Diabetes Mother   . Diabetes Father   . Stroke Sister   . Heart attack Brother     Medications: Patient's Medications  New Prescriptions   No medications on file  Previous Medications   ACETAMINOPHEN (TYLENOL) 325 MG TABLET    Take 2 tablets (650 mg total) by mouth every 6 (six) hours as needed for mild pain (or Fever >/= 101).   AMBULATORY NON FORMULARY MEDICATION    Lab Order:  BNP DX: Edema, DOE Please draw this at Facility.   ASPIRIN EC 325 MG EC TABLET    Take 1 tablet (325 mg total) by mouth 2 (two) times daily after a meal.   BUMETANIDE (BUMEX) 1 MG TABLET    Take 1 mg by mouth daily. Take in the  evening   CLONAZEPAM (KLONOPIN) 1 MG TABLET    Take 1 tablet (1 mg total) by mouth at bedtime.   FEEDING SUPPLEMENT, ENSURE COMPLETE, (ENSURE COMPLETE) LIQD    Take 237 mLs by mouth 2 (two) times daily between meals.   FUROSEMIDE (LASIX) 20 MG TABLET    Take 1 tablet (20 mg total) by mouth 2 (two) times daily. At breakfast and lunch   HYDROCODONE-ACETAMINOPHEN (NORCO/VICODIN) 5-325 MG PER TABLET    Take 1 tablet by mouth every 6 (six) hours as needed for moderate pain.   POTASSIUM CHLORIDE SA (K-DUR,KLOR-CON) 20 MEQ TABLET    Take 1 tablet (20 mEq total) by mouth daily.   SENNA (SENOKOT) 8.6 MG TABS TABLET    Take 1 tablet by mouth.   TIZANIDINE (ZANAFLEX) 2 MG TABLET    Take 2 mg by mouth 2 (two) times daily.   TRIAMCINOLONE CREAM (KENALOG) 0.5 %    Apply 1 application topically 2 (two) times daily. To rash on legs  Modified Medications   No medications on file  Discontinued Medications   No medications on file     Physical Exam: Filed Vitals:   09/15/13 1130  BP: 122/70  Pulse: 52  Temp: 97.6 F (36.4 C)  TempSrc: Oral  Weight: 91 lb 6.4 oz  (41.459 kg)  SpO2: 97%  Physical Exam  Constitutional: No distress.  Frail white female, walks with rollator walker  HENT:  Head: Normocephalic and atraumatic.  Cardiovascular: Normal heart sounds and intact distal pulses.   Huston Foley (first degree heart block);  Edema of bilateral calves, but no longer edematous in thighs or knees  Pulmonary/Chest: Effort normal and breath sounds normal. No respiratory distress.  Abdominal:  Soft. Bowel sounds are normal. She exhibits no distension. There is no tenderness.  Musculoskeletal: Normal range of motion. She exhibits no tenderness.  Neurological: She is alert.  Skin: Skin is warm and dry.    Labs reviewed: Basic Metabolic Panel:  Recent Labs  16/10/96 0602 08/12/13 0421 08/13/13 0905 08/14/13 0625 09/05/13 0915  NA 141 143 142 143 142  K 4.0 3.9 3.4* 3.6 4.3  CL 107 105 102 101 101  CO2 26 27 29  33* 24  GLUCOSE 109* 87 103* 104* 89  BUN 16 14 11 12 14   CREATININE 0.72 0.64 0.57 0.65 0.71  CALCIUM 7.9* 8.8 9.1 9.4 9.1  MG  --  1.4*  --  1.9  --    Liver Function Tests: No results found for this basename: AST, ALT, ALKPHOS, BILITOT, PROT, ALBUMIN,  in the last 8760 hours No results found for this basename: LIPASE, AMYLASE,  in the last 8760 hours No results found for this basename: AMMONIA,  in the last 8760 hours CBC:  Recent Labs  08/07/13 2149  08/10/13 0725 08/11/13 0602 08/12/13 0421  WBC 6.5  < > 8.6 7.6 7.4  NEUTROABS 4.6  --   --   --   --   HGB 11.6*  < > 11.1* 9.9* 11.7*  HCT 34.8*  < > 34.6* 30.6* 35.5*  MCV 89.2  < > 91.1 89.7 89.6  PLT 219  < > 210 197 256  < > = values in this interval not displayed.  Assessment/Plan 1. Edema -mild stil present in calves, but has resolved all the way up through thighs and respiratory symptoms resolved (w/ lasix 20mg  bid for the past week) -will cut back down to lasix 20mg  every other day which had been her dose before her hospice admission, hospitalization and visit with  facility physician -also reduce kcl to every other day, as well -stop bumex started by facility physician 2. DOE (dyspnea on exertion) -much better after diuresis 3. Spinal stenosis, unspecified region other than cervical -back pain seems to have improved after hip surgery also, walking better with rollator 4. Chronic constipation -will add back senna s one tab daily 5. Alzheimer's disease -had been worsened by pain meds and depression and is now mentally much better--should recheck mmse next time 6. Failure to thrive in adult -improved also--appetite better  Next appt:  2 mos f/u

## 2013-09-18 ENCOUNTER — Ambulatory Visit: Payer: Medicare Other | Admitting: Internal Medicine

## 2013-09-18 DIAGNOSIS — Z471 Aftercare following joint replacement surgery: Secondary | ICD-10-CM | POA: Diagnosis not present

## 2013-09-18 DIAGNOSIS — R279 Unspecified lack of coordination: Secondary | ICD-10-CM | POA: Diagnosis not present

## 2013-09-18 DIAGNOSIS — M6281 Muscle weakness (generalized): Secondary | ICD-10-CM | POA: Diagnosis not present

## 2013-09-18 DIAGNOSIS — Z96649 Presence of unspecified artificial hip joint: Secondary | ICD-10-CM | POA: Diagnosis not present

## 2013-09-18 DIAGNOSIS — R269 Unspecified abnormalities of gait and mobility: Secondary | ICD-10-CM | POA: Diagnosis not present

## 2013-09-20 DIAGNOSIS — R279 Unspecified lack of coordination: Secondary | ICD-10-CM | POA: Diagnosis not present

## 2013-09-20 DIAGNOSIS — M6281 Muscle weakness (generalized): Secondary | ICD-10-CM | POA: Diagnosis not present

## 2013-09-20 DIAGNOSIS — Z471 Aftercare following joint replacement surgery: Secondary | ICD-10-CM | POA: Diagnosis not present

## 2013-09-20 DIAGNOSIS — Z96649 Presence of unspecified artificial hip joint: Secondary | ICD-10-CM | POA: Diagnosis not present

## 2013-09-20 DIAGNOSIS — R269 Unspecified abnormalities of gait and mobility: Secondary | ICD-10-CM | POA: Diagnosis not present

## 2013-10-24 ENCOUNTER — Other Ambulatory Visit: Payer: Self-pay | Admitting: *Deleted

## 2013-10-24 MED ORDER — HYDROCODONE-ACETAMINOPHEN 5-325 MG PO TABS: ORAL_TABLET | ORAL | Status: DC

## 2013-11-02 ENCOUNTER — Emergency Department (HOSPITAL_COMMUNITY): Payer: Medicare Other

## 2013-11-02 ENCOUNTER — Encounter (HOSPITAL_COMMUNITY): Payer: Self-pay | Admitting: Emergency Medicine

## 2013-11-02 ENCOUNTER — Emergency Department (HOSPITAL_COMMUNITY)
Admission: EM | Admit: 2013-11-02 | Discharge: 2013-11-02 | Disposition: A | Discharge status: Skilled Nursing Facility | Payer: Medicare Other | Attending: Emergency Medicine | Admitting: Emergency Medicine

## 2013-11-02 DIAGNOSIS — F3289 Other specified depressive episodes: Secondary | ICD-10-CM | POA: Insufficient documentation

## 2013-11-02 DIAGNOSIS — J449 Chronic obstructive pulmonary disease, unspecified: Secondary | ICD-10-CM | POA: Diagnosis not present

## 2013-11-02 DIAGNOSIS — Z8742 Personal history of other diseases of the female genital tract: Secondary | ICD-10-CM | POA: Insufficient documentation

## 2013-11-02 DIAGNOSIS — M549 Dorsalgia, unspecified: Secondary | ICD-10-CM | POA: Diagnosis not present

## 2013-11-02 DIAGNOSIS — M1990 Unspecified osteoarthritis, unspecified site: Secondary | ICD-10-CM | POA: Insufficient documentation

## 2013-11-02 DIAGNOSIS — F0390 Unspecified dementia without behavioral disturbance: Secondary | ICD-10-CM | POA: Insufficient documentation

## 2013-11-02 DIAGNOSIS — I6350 Cerebral infarction due to unspecified occlusion or stenosis of unspecified cerebral artery: Secondary | ICD-10-CM | POA: Diagnosis not present

## 2013-11-02 DIAGNOSIS — F411 Generalized anxiety disorder: Secondary | ICD-10-CM | POA: Diagnosis not present

## 2013-11-02 DIAGNOSIS — R51 Headache: Secondary | ICD-10-CM | POA: Diagnosis not present

## 2013-11-02 DIAGNOSIS — G8929 Other chronic pain: Secondary | ICD-10-CM | POA: Diagnosis not present

## 2013-11-02 DIAGNOSIS — N181 Chronic kidney disease, stage 1: Secondary | ICD-10-CM | POA: Insufficient documentation

## 2013-11-02 DIAGNOSIS — I129 Hypertensive chronic kidney disease with stage 1 through stage 4 chronic kidney disease, or unspecified chronic kidney disease: Secondary | ICD-10-CM | POA: Diagnosis not present

## 2013-11-02 DIAGNOSIS — I10 Essential (primary) hypertension: Secondary | ICD-10-CM

## 2013-11-02 DIAGNOSIS — IMO0002 Reserved for concepts with insufficient information to code with codable children: Secondary | ICD-10-CM | POA: Diagnosis not present

## 2013-11-02 DIAGNOSIS — M109 Gout, unspecified: Secondary | ICD-10-CM | POA: Diagnosis not present

## 2013-11-02 DIAGNOSIS — E119 Type 2 diabetes mellitus without complications: Secondary | ICD-10-CM | POA: Insufficient documentation

## 2013-11-02 DIAGNOSIS — J4489 Other specified chronic obstructive pulmonary disease: Secondary | ICD-10-CM | POA: Insufficient documentation

## 2013-11-02 DIAGNOSIS — Z872 Personal history of diseases of the skin and subcutaneous tissue: Secondary | ICD-10-CM | POA: Diagnosis not present

## 2013-11-02 DIAGNOSIS — Z79899 Other long term (current) drug therapy: Secondary | ICD-10-CM | POA: Diagnosis not present

## 2013-11-02 DIAGNOSIS — Z87828 Personal history of other (healed) physical injury and trauma: Secondary | ICD-10-CM | POA: Insufficient documentation

## 2013-11-02 DIAGNOSIS — I4430 Unspecified atrioventricular block: Secondary | ICD-10-CM | POA: Insufficient documentation

## 2013-11-02 DIAGNOSIS — J45909 Unspecified asthma, uncomplicated: Secondary | ICD-10-CM | POA: Insufficient documentation

## 2013-11-02 DIAGNOSIS — Z862 Personal history of diseases of the blood and blood-forming organs and certain disorders involving the immune mechanism: Secondary | ICD-10-CM | POA: Insufficient documentation

## 2013-11-02 DIAGNOSIS — F329 Major depressive disorder, single episode, unspecified: Secondary | ICD-10-CM | POA: Diagnosis not present

## 2013-11-02 DIAGNOSIS — R6889 Other general symptoms and signs: Secondary | ICD-10-CM | POA: Diagnosis not present

## 2013-11-02 DIAGNOSIS — G4700 Insomnia, unspecified: Secondary | ICD-10-CM | POA: Diagnosis not present

## 2013-11-02 LAB — COMPREHENSIVE METABOLIC PANEL
ALBUMIN: 4.1 g/dL (ref 3.5–5.2)
ALK PHOS: 12 U/L — AB (ref 39–117)
ALT: 15 U/L (ref 0–35)
AST: 20 U/L (ref 0–37)
BILIRUBIN TOTAL: 0.6 mg/dL (ref 0.3–1.2)
BUN: 19 mg/dL (ref 6–23)
CHLORIDE: 96 meq/L (ref 96–112)
CO2: 27 mEq/L (ref 19–32)
Calcium: UNDETERMINED mg/dL (ref 8.4–10.5)
Creatinine, Ser: 0.67 mg/dL (ref 0.50–1.10)
GFR calc Af Amer: 84 mL/min — ABNORMAL LOW (ref >90)
GFR calc non Af Amer: 72 mL/min — ABNORMAL LOW (ref >90)
Glucose, Bld: 101 mg/dL — ABNORMAL HIGH (ref 70–99)
POTASSIUM: UNDETERMINED meq/L (ref 3.7–5.3)
SODIUM: 143 meq/L (ref 137–147)
Total Protein: 7.2 g/dL (ref 6.0–8.3)

## 2013-11-02 LAB — URINALYSIS, ROUTINE W REFLEX MICROSCOPIC
BILIRUBIN URINE: NEGATIVE
Glucose, UA: NEGATIVE mg/dL
Hgb urine dipstick: NEGATIVE
KETONES UR: NEGATIVE mg/dL
LEUKOCYTES UA: NEGATIVE
Nitrite: NEGATIVE
PH: 7.5 (ref 5.0–8.0)
PROTEIN: 100 mg/dL — AB
Specific Gravity, Urine: 1.008 (ref 1.005–1.030)
Urobilinogen, UA: 0.2 mg/dL (ref 0.0–1.0)

## 2013-11-02 LAB — CBC WITH DIFFERENTIAL/PLATELET
Basophils Absolute: 0 10*3/uL (ref 0.0–0.1)
Basophils Relative: 0 % (ref 0–1)
Eosinophils Absolute: 0.1 10*3/uL (ref 0.0–0.7)
Eosinophils Relative: 1 % (ref 0–5)
HCT: 42.9 % (ref 36.0–46.0)
HEMOGLOBIN: 14.8 g/dL (ref 12.0–15.0)
LYMPHS ABS: 1.8 10*3/uL (ref 0.7–4.0)
LYMPHS PCT: 22 % (ref 12–46)
MCH: 29.5 pg (ref 26.0–34.0)
MCHC: 34.5 g/dL (ref 30.0–36.0)
MCV: 85.6 fL (ref 78.0–100.0)
Monocytes Absolute: 0.4 10*3/uL (ref 0.1–1.0)
Monocytes Relative: 4 % (ref 3–12)
NEUTROS ABS: 5.8 10*3/uL (ref 1.7–7.7)
NEUTROS PCT: 72 % (ref 43–77)
PLATELETS: 131 10*3/uL — AB (ref 150–400)
RBC: 5.01 MIL/uL (ref 3.87–5.11)
RDW: 13 % (ref 11.5–15.5)
WBC: 8 10*3/uL (ref 4.0–10.5)

## 2013-11-02 LAB — URINE MICROSCOPIC-ADD ON

## 2013-11-02 MED ORDER — LISINOPRIL 20 MG PO TABS: 20.0000 mg | ORAL_TABLET | Freq: Every day | ORAL | Status: DC

## 2013-11-02 MED ORDER — AMLODIPINE BESYLATE 10 MG PO TABS: 10.0000 mg | ORAL_TABLET | Freq: Every day | ORAL | Status: DC

## 2013-11-02 MED ORDER — HYDRALAZINE HCL 20 MG/ML IJ SOLN
10.0000 mg | Freq: Once | INTRAMUSCULAR | Status: AC
  Administered 2013-11-02: 10 mg via INTRAVENOUS
  Filled 2013-11-02: qty 1

## 2013-11-02 MED ORDER — HYDROCODONE-ACETAMINOPHEN 5-325 MG PO TABS
2.0000 | ORAL_TABLET | Freq: Once | ORAL | Status: AC
  Administered 2013-11-02: 2 via ORAL
  Filled 2013-11-02: qty 2

## 2013-11-02 MED ORDER — ONDANSETRON HCL 4 MG/2ML IJ SOLN
4.0000 mg | Freq: Once | INTRAMUSCULAR | Status: AC
  Administered 2013-11-02: 4 mg via INTRAVENOUS
  Filled 2013-11-02: qty 2

## 2013-11-02 NOTE — ED Notes (Signed)
Unable to obtain IV access after 2 attempts , iv team paged

## 2013-11-02 NOTE — ED Provider Notes (Signed)
CSN: [id number]     Arrival date & time 11/02/13  4098 History   First MD Initiated Contact with Patient 11/02/13 [phone]     Chief Complaint  Patient presents with  . Hypertension   (Consider location/radiation/quality/duration/timing/severity/associated sxs/prior Treatment) The history is provided by the patient.  BURNETTE VALENTI is a 80 y.o. female hx of COPD, HTN, here with hypertension, headaches. She has been having headaches for the last 3 days that's diffuse. She denies any head injury or neck pain or fevers. She lives in a nursing home was noted to be hypertensive today. She is off her antihypertensives for the last several months. Denies any chest pain or shortness of breath or abdominal pain. Has chronic back pain that is not worsening.    Past Medical History  Diagnosis Date  . COPD (chronic obstructive pulmonary disease)   . Hypertension   . Oral aphthae   . Orthostatic hypotension   . Senile osteoporosis   . Atrophy of vulva   . Unspecified urinary incontinence   . Urinary frequency   . Unspecified vitamin D deficiency   . Anxiety state, unspecified   . Restless legs syndrome (RLS)   . Unspecified disorder of kidney and ureter   . Atrioventricular block, unspecified   . Closed dislocation of shoulder, unspecified site   . Anemia, unspecified   . Altered mental status   . Lumbago   . Transient disorder of initiating or maintaining sleep   . Cellulitis and abscess of unspecified site   . Sebaceous cyst   . Insomnia, unspecified   . Dementia in conditions classified elsewhere without behavioral disturbance   . Depressive disorder, not elsewhere classified   . Abnormality of gait   . Adult failure to thrive   . Headache(784.0)   . Type II or unspecified type diabetes mellitus without mention of complication, not stated as uncontrolled   . Gout, unspecified   . Extrinsic asthma, unspecified   . Chronic kidney disease, stage I   . Osteoarthrosis, unspecified whether  generalized or localized, unspecified site   . Spinal stenosis, unspecified region other than cervical   . Edema   . Tachycardia, unspecified    Past Surgical History  Procedure Laterality Date  . Hip surgery      (R) hip replacement  . Cesarean section    . Abdominal hysterectomy      partial  . Back surgery      fusion  . Breast surgery      bilateral breast reduction  . Eye surgery      bilateral cataract  . Basal cell carcinoma excision    . Basal cell carcinoma excision      face  . Hip closed reduction Right 08/07/2013    Procedure: ATTEMPTED CLOSED MANIPULATION HIP;  Surgeon: Jodi Marble, MD;  Location: [facility] OR;  Service: Orthopedics;  Laterality: Right;  . Total hip revision Right 08/08/2013    Procedure: TOTAL HIP REVISION- right;  Surgeon: Velna Ochs, MD;  Location: [facility] OR;  Service: Orthopedics;  Laterality: Right;   Family History  Problem Relation Age of Onset  . Diabetes Mother   . Diabetes Father   . Stroke Sister   . Heart attack Brother    History  Substance Use Topics  . Smoking status: Never Smoker   . Smokeless tobacco: Not on file  . Alcohol Use: No   OB History   Grav Para Term Preterm Abortions TAB SAB Ect Mult Living  Review of Systems  Neurological: Positive for headaches.  All other systems reviewed and are negative.    Allergies  Sulfa antibiotics; Clonidine derivatives; Lactose intolerance (gi); and Verapamil  Home Medications   Current Outpatient Rx  Name  Route  Sig  Dispense  Refill  . clonazePAM (KLONOPIN) 1 MG tablet   Oral   Take 1 tablet (1 mg total) by mouth at bedtime.   30 tablet   5   . furosemide (LASIX) 20 MG tablet   Oral   Take 20 mg by mouth every other day.         Marland Kitchen. HYDROcodone-acetaminophen (NORCO/VICODIN) 5-325 MG per tablet   Oral   Take 1 tablet by mouth 2 (two) times daily.         . potassium chloride SA (K-DUR,KLOR-CON) 20 MEQ tablet   Oral   Take 20 mEq by mouth  every other day.         . senna-docusate (SENOKOT-S) 8.6-50 MG per tablet   Oral   Take 1 tablet by mouth daily.         Marland Kitchen. tiZANidine (ZANAFLEX) 2 MG tablet   Oral   Take 2 mg by mouth daily.          Marland Kitchen. triamcinolone cream (KENALOG) 0.5 %   Topical   Apply 1 application topically 2 (two) times daily. To rash on legs   30 g   3    BP 168/60  Pulse 63  Temp(Src) 97.7 F (36.5 C) (Oral)  Resp 17  SpO2 94% Physical Exam  Nursing note and vitals reviewed. Constitutional:  Slightly demented, uncomfortable   HENT:  Head: Normocephalic.  Mouth/Throat: Oropharynx is clear and moist.  Eyes: Conjunctivae and EOM are normal. Pupils are equal, round, and reactive to light.  Neck: Normal range of motion. Neck supple.  Cardiovascular: Normal rate, regular rhythm and normal heart sounds.   Pulmonary/Chest: Effort normal and breath sounds normal. No respiratory distress. She has no wheezes. She has no rales.  Abdominal: Soft. Bowel sounds are normal. She exhibits no distension. There is no tenderness. There is no rebound.  Musculoskeletal: Normal range of motion. She exhibits no edema.  Neurological: She is alert. No cranial nerve deficit. Coordination normal.  Nl strength and sensation throughout. No facial droop.   Skin: Skin is warm and dry.  Psychiatric: She has a normal mood and affect. Her behavior is normal. Judgment and thought content normal.    ED Course  Procedures (including critical care time) Labs Review Labs Reviewed  CBC WITH DIFFERENTIAL - Abnormal; Notable for the following:    Platelets 131 (*)    All other components within normal limits  COMPREHENSIVE METABOLIC PANEL - Abnormal; Notable for the following:    Glucose, Bld 101 (*)    Alkaline Phosphatase 12 (*)    GFR calc non Af Amer 72 (*)    GFR calc Af Amer 84 (*)    All other components within normal limits  URINALYSIS, ROUTINE W REFLEX MICROSCOPIC - Abnormal; Notable for the following:     Protein, ur 100 (*)    All other components within normal limits  URINE MICROSCOPIC-ADD ON - Abnormal; Notable for the following:    Squamous Epithelial / LPF FEW (*)    Bacteria, UA MANY (*)    All other components within normal limits  URINE CULTURE   Imaging Review Ct Head Wo Contrast  11/02/2013   CLINICAL DATA:  Hypertension.  Headache.  EXAM: CT HEAD WITHOUT CONTRAST  TECHNIQUE: Contiguous axial images were obtained from the base of the skull through the vertex without intravenous contrast.  COMPARISON:  Head CT 01/09/2013.  FINDINGS: Well-defined focus of low attenuation in the left cerebellar hemisphere (image 7 of series 2), new compared to the prior study, most compatible with an old lacunar infarction. Mild cerebral atrophy. Patchy and confluent areas of decreased attenuation are noted throughout the deep and periventricular white matter of the cerebral hemispheres bilaterally, compatible with chronic microvascular ischemic disease. No acute intracranial abnormalities. Specifically, no evidence of acute intracranial hemorrhage, no definite findings of acute/subacute cerebral ischemia, no mass, mass effect, hydrocephalus or abnormal intra or extra-axial fluid collections. Visualized paranasal sinuses and mastoids are well pneumatized. No acute displaced skull fractures are identified.  IMPRESSION: 1. No acute intracranial abnormalities. 2. New lacunar infarction in the left cerebellar hemisphere compared to prior study 01/09/2013. This is not an acute finding. 3. Mild cerebral atrophy with extensive chronic microvascular ischemic changes in the cerebral white matter, similar to the prior study.   Electronically Signed   By: Trudie Reed M.D.   On: 11/02/2013 10:43    EKG Interpretation    Date/Time:  Thursday November 02 2013 09:41:09 EST Ventricular Rate:  49 PR Interval:  248 QRS Duration: 85 QT Interval:  589 QTC Calculation: 532 R Axis:   20 Text Interpretation:  Sinus  bradycardia Prolonged PR interval Left atrial enlargement LVH with secondary repolarization abnormality Anteroseptal infarct, old Prolonged QT interval No significant change since last tracing Confirmed by YAO  MD, DAVID [phone]) on 11/02/2013 9:49:31 AM            MDM  No diagnosis found. MACKINZIE VUNCANNON is a 80 y.o. female here with hypertension, headache. Will get CT head to r/o bleed. Patient is DNR and daughter, who is the proxy, doesn't want much workup. I doubt subarachnoid. I doubt dissection or AAA.   2:06 PM BP dec to 160s from 250s with hydralazine and pain meds. Likely from pain and headache. She was on lisinopril and amlodipine previously. Will d/c home on same.      Richardean Canal, MD 11/02/13 [phone]

## 2013-11-02 NOTE — ED Notes (Signed)
Returned from ct scan , md in room to start IV with [place]

## 2013-11-02 NOTE — ED Notes (Signed)
[facility] with [facility]/o htn , and headache, pt has ben off her b/p meds for about 5 monthes

## 2013-11-02 NOTE — Discharge Instructions (Signed)
Take lisinopril 20 mg daily and norvasc 10 mg daily.   Take your pain medication as prescribed.   Check blood pressure daily.   Follow up with your doctor to recheck your pressure.   Return to ER if you have severe pain, headache, chest pain, shortness of breath, abdominal pain.

## 2013-11-04 LAB — URINE CULTURE

## 2013-11-05 ENCOUNTER — Telehealth (HOSPITAL_COMMUNITY): Payer: Self-pay | Admitting: Emergency Medicine

## 2013-11-05 NOTE — ED Notes (Signed)
Post ED Visit - Positive Culture Follow-up  Culture report reviewed by antimicrobial stewardship pharmacist: []  Wes Dulaney, Pharm.D., BCPS [x]  Celedonio MiyamotoJeremy Frens, Pharm.D., BCPS []  Georgina PillionElizabeth Martin, Pharm.D., BCPS []  Pinos AltosMinh Pham, [address] BoulevardPharm.D., BCPS, AAHIVP []  Estella HuskMichelle Turner, Pharm.D., BCPS, AAHIVP  Positive urine culture Per Tatyana Kirichenko PA-C, no treatment needed and no further patient follow-up is required at this time.  Marcelle OverlieHolland, Jenel LucksKylie 11/05/2013, 9:55 AM

## 2013-11-06 ENCOUNTER — Ambulatory Visit (INDEPENDENT_AMBULATORY_CARE_PROVIDER_SITE_OTHER): Payer: Medicare Other | Admitting: Internal Medicine

## 2013-11-06 ENCOUNTER — Encounter: Payer: Self-pay | Admitting: Internal Medicine

## 2013-11-06 VITALS — BP 120/60 | HR 51 | Temp 97.7°F | Resp 10 | Wt 92.0 lb

## 2013-11-06 DIAGNOSIS — R627 Adult failure to thrive: Secondary | ICD-10-CM

## 2013-11-06 DIAGNOSIS — I959 Hypotension, unspecified: Secondary | ICD-10-CM | POA: Diagnosis not present

## 2013-11-06 DIAGNOSIS — R001 Bradycardia, unspecified: Secondary | ICD-10-CM

## 2013-11-06 DIAGNOSIS — I498 Other specified cardiac arrhythmias: Secondary | ICD-10-CM | POA: Diagnosis not present

## 2013-11-06 NOTE — Progress Notes (Signed)
Patient ID: [name], female   DOB: 09-26-1916, 80 y.o.   MRN: [id number]   Location:  [facility]  Code Status: DNR  Allergies  Allergen Reactions  . Sulfa Antibiotics Shortness Of Breath    Worsens asthma  . Clonidine Derivatives   . Lactose Intolerance (Gi)   . Verapamil     [facility] MAR    Chief Complaint  Patient presents with  . Hospitalization Follow-up    Hospital follow-up on HTN, patient is now with low B/P and very low pulse rate   . Medication Management    Patient needs order for lubricate eye drops and tylenol as needed   . Dizziness    ? related to low B/P and low pulse     HPI: Patient is a 80 y.o. white female seen in the office today for hospital f/u from ED visit with hypertensive urgency.  Had been having a headache for 3 days.  Then she had the worst headache she'd ever had at 5am.  Daughter took her to the ED.  Was having some slurred speech so did agree to go.  SBP was over 250, diastolic in 80s (but had been over 110).  Is in chronic first degree heart block.  At one time, had been in second degree.  Was on CCB in past, so that was stopped by Dr. Tenny Craw a while back.  Now is back on lisinopril and amlodipine since ED visit.  Says she feels punky which means lousy apparently.  Is dizzy also.    Just doesn't feel good.  When gets up, she is ready to fall.    Review of Systems:  Review of Systems  Constitutional: Positive for malaise/fatigue. Negative for fever and chills.  Respiratory: Negative for shortness of breath.   Cardiovascular: Negative for chest pain and leg swelling.  Gastrointestinal: Positive for constipation.  Genitourinary: Negative for dysuria.  Musculoskeletal: Negative for falls.  Neurological: Positive for dizziness and weakness.  Psychiatric/Behavioral: Positive for depression and memory loss.    Past Medical History  Diagnosis Date  . COPD (chronic obstructive pulmonary disease)   .  Hypertension   . Oral aphthae   . Orthostatic hypotension   . Senile osteoporosis   . Atrophy of vulva   . Unspecified urinary incontinence   . Urinary frequency   . Unspecified vitamin D deficiency   . Anxiety state, unspecified   . Restless legs syndrome (RLS)   . Unspecified disorder of kidney and ureter   . Atrioventricular block, unspecified   . Closed dislocation of shoulder, unspecified site   . Anemia, unspecified   . Altered mental status   . Lumbago   . Transient disorder of initiating or maintaining sleep   . Cellulitis and abscess of unspecified site   . Sebaceous cyst   . Insomnia, unspecified   . Dementia in conditions classified elsewhere without behavioral disturbance   . Depressive disorder, not elsewhere classified   . Abnormality of gait   . Adult failure to thrive   . Headache(784.0)   . Type II or unspecified type diabetes mellitus without mention of complication, not stated as uncontrolled   . Gout, unspecified   . Extrinsic asthma, unspecified   . Chronic kidney disease, stage I   . Osteoarthrosis, unspecified whether generalized or localized, unspecified site   . Spinal stenosis, unspecified region other than cervical   . Edema   . Tachycardia, unspecified  Past Surgical History  Procedure Laterality Date  . Hip surgery      (R) hip replacement  . Cesarean section    . Abdominal hysterectomy      partial  . Back surgery      fusion  . Breast surgery      bilateral breast reduction  . Eye surgery      bilateral cataract  . Basal cell carcinoma excision    . Basal cell carcinoma excision      face  . Hip closed reduction Right 08/07/2013    Procedure: ATTEMPTED CLOSED MANIPULATION HIP;  Surgeon: Jodi Marble, MD;  Location: [facility] OR;  Service: Orthopedics;  Laterality: Right;  . Total hip revision Right 08/08/2013    Procedure: TOTAL HIP REVISION- right;  Surgeon: Velna Ochs, MD;  Location: [facility] OR;  Service: Orthopedics;   Laterality: Right;    Social History:   reports that she has never smoked. She does not have any smokeless tobacco history on file. She reports that she does not drink alcohol or use illicit drugs.  Family History  Problem Relation Age of Onset  . Diabetes Mother   . Diabetes Father   . Stroke Sister   . Heart attack Brother     Medications: Patient's Medications  New Prescriptions   No medications on file  Previous Medications   AMLODIPINE (NORVASC) 10 MG TABLET    Take 1 tablet (10 mg total) by mouth daily.   CLONAZEPAM (KLONOPIN) 1 MG TABLET    Take 1 tablet (1 mg total) by mouth at bedtime.   FUROSEMIDE (LASIX) 20 MG TABLET    Take 20 mg by mouth every other day.   HYDROCODONE-ACETAMINOPHEN (NORCO/VICODIN) 5-325 MG PER TABLET    Take 1 tablet by mouth 2 (two) times daily.   LISINOPRIL (PRINIVIL,ZESTRIL) 20 MG TABLET    Take 1 tablet (20 mg total) by mouth daily.   POTASSIUM CHLORIDE SA (K-DUR,KLOR-CON) 20 MEQ TABLET    Take 20 mEq by mouth every other day.   SENNA-DOCUSATE (SENOKOT-S) 8.6-50 MG PER TABLET    Take 1 tablet by mouth daily.   TIZANIDINE (ZANAFLEX) 2 MG TABLET    Take 2 mg by mouth daily.    TRIAMCINOLONE CREAM (KENALOG) 0.5 %    Apply 1 application topically 2 (two) times daily. To rash on legs  Modified Medications   No medications on file  Discontinued Medications   No medications on file   Physical Exam: Filed Vitals:   11/06/13 0911  BP: 120/68  Pulse: 78  Temp: 97.7 F (36.5 C)  TempSrc: Oral  Resp: 10  Weight: 92 lb (41.731 kg)  SpO2: 95%  Physical Exam  Constitutional: No distress.  Frail white female, appears sleepy and miserable today  Cardiovascular: Intact distal pulses.   Bradycardic from 30s-50s today  Pulmonary/Chest: Effort normal and breath sounds normal. She has no rales.  Abdominal: Soft. Bowel sounds are normal. She exhibits no distension.  Neurological: She is alert.  But drowsy  Skin: Skin is warm and dry.  Psychiatric:    Flat affect    Labs reviewed: Basic Metabolic Panel:  Recent Labs  16/10/96 0602 08/12/13 0421  08/14/13 0625 09/05/13 0915 11/02/13 1112  NA 141 143  < > 143 142 143  K 4.0 3.9  < > 3.6 4.3 SPECIMEN/CONTAINER TYPE INAPPROPRIATE FOR ORDERED TEST, UNABLE TO PERFORM  CL 107 105  < > 101 101 96  CO2 26 27  < >  33* 24 27  GLUCOSE 109* 87  < > 104* 89 101*  BUN 16 14  < > 12 14 19   CREATININE 0.72 0.64  < > 0.65 0.71 0.67  CALCIUM 7.9* 8.8  < > 9.4 9.1 SPECIMEN/CONTAINER TYPE INAPPROPRIATE FOR ORDERED TEST, UNABLE TO PERFORM  MG  --  1.4*  --  1.9  --   --   < > = values in this interval not displayed. Liver Function Tests:  Recent Labs  11/02/13 1112  AST 20  ALT 15  ALKPHOS 12*  BILITOT 0.6  PROT 7.2  ALBUMIN 4.1  CBC:  Recent Labs  08/07/13 2149  08/11/13 0602 08/12/13 0421 11/02/13 1112  WBC 6.5  < > 7.6 7.4 8.0  NEUTROABS 4.6  --   --   --  5.8  HGB 11.6*  < > 9.9* 11.7* 14.8  HCT 34.8*  < > 30.6* 35.5* 42.9  MCV 89.2  < > 89.7 89.6 85.6  PLT 219  < > 197 256 131*  < > = values in this interval not displayed.  Assessment/Plan 1. Hypotension -added hold parameters to lisinopril and amlodipine for bp <140  2. Bradycardia with 31 - 40 beats per minute -in 1st degree av block and pulse very slow, but not on any rate-altering meds at this time -advised increased hydration to help with this as she appears dry  3. Failure to thrive in adult -doing worse these days again  Labs/tests ordered:  None  Next appt:  2 mos

## 2013-11-20 ENCOUNTER — Ambulatory Visit: Payer: Medicare Other | Admitting: Internal Medicine

## 2013-11-20 ENCOUNTER — Other Ambulatory Visit: Payer: Self-pay | Admitting: *Deleted

## 2013-11-20 ENCOUNTER — Telehealth: Payer: Self-pay | Admitting: *Deleted

## 2013-11-20 DIAGNOSIS — R262 Difficulty in walking, not elsewhere classified: Secondary | ICD-10-CM | POA: Diagnosis not present

## 2013-11-20 DIAGNOSIS — M545 Low back pain, unspecified: Secondary | ICD-10-CM | POA: Diagnosis not present

## 2013-11-20 DIAGNOSIS — R279 Unspecified lack of coordination: Secondary | ICD-10-CM | POA: Diagnosis not present

## 2013-11-20 DIAGNOSIS — M48061 Spinal stenosis, lumbar region without neurogenic claudication: Secondary | ICD-10-CM | POA: Diagnosis not present

## 2013-11-20 DIAGNOSIS — Z96649 Presence of unspecified artificial hip joint: Secondary | ICD-10-CM | POA: Diagnosis not present

## 2013-11-20 MED ORDER — HYDROCODONE-ACETAMINOPHEN 5-325 MG PO TABS: 1.0000 | ORAL_TABLET | Freq: Two times a day (BID) | ORAL | Status: DC

## 2013-11-20 NOTE — Telephone Encounter (Signed)
[facility]

## 2013-11-20 NOTE — Telephone Encounter (Signed)
FYI: Pt recently started cream, has gained a few pounds over last couple days, recent BP was 180/64 after walking for 6 minutes.\ Pt instructed to elevate legs.  Daughter states that she doesn't feel like she needs an appoint.

## 2013-11-21 ENCOUNTER — Telehealth: Payer: Self-pay

## 2013-11-21 DIAGNOSIS — M48061 Spinal stenosis, lumbar region without neurogenic claudication: Secondary | ICD-10-CM | POA: Diagnosis not present

## 2013-11-21 DIAGNOSIS — R279 Unspecified lack of coordination: Secondary | ICD-10-CM | POA: Diagnosis not present

## 2013-11-21 DIAGNOSIS — M545 Low back pain, unspecified: Secondary | ICD-10-CM | POA: Diagnosis not present

## 2013-11-21 DIAGNOSIS — R262 Difficulty in walking, not elsewhere classified: Secondary | ICD-10-CM | POA: Diagnosis not present

## 2013-11-21 DIAGNOSIS — Z96649 Presence of unspecified artificial hip joint: Secondary | ICD-10-CM | POA: Diagnosis not present

## 2013-11-21 MED ORDER — AMBULATORY NON FORMULARY MEDICATION: Status: DC

## 2013-11-21 NOTE — Telephone Encounter (Signed)
Let's give her lasix and potassium daily 2/24(today), 2/25, 2/26, and 2/27, then return to every other day for both. Continue daily weights and send the record of them to the office for the past 2 wks.   Please fax these orders to her facility and notify her daughter (maybe it can be printed with my electronic signature).

## 2013-11-21 NOTE — Telephone Encounter (Signed)
Patient's daughter called indicating her mother may possibly have gout, patient with bilateral foot pain. Patient was previously on treatment for gout, discontinued about 6 months ago. Patient's daughter states no one has evaluated her for recent foot pain. Dr.Reed please advise if gout medication to be prescribed or patient needs evaluation/labs prior to confirm gout attack then treat

## 2013-11-21 NOTE — Telephone Encounter (Signed)
Her facility is [facility].  Please add that somewhere obvious to her demographics.

## 2013-11-21 NOTE — Telephone Encounter (Signed)
[facility]ders fax[facility] @ [phone], daughter aware. FYI added to chart indicating facility location [facility]

## 2013-11-22 DIAGNOSIS — M48061 Spinal stenosis, lumbar region without neurogenic claudication: Secondary | ICD-10-CM | POA: Diagnosis not present

## 2013-11-22 DIAGNOSIS — M545 Low back pain, unspecified: Secondary | ICD-10-CM | POA: Diagnosis not present

## 2013-11-22 DIAGNOSIS — R279 Unspecified lack of coordination: Secondary | ICD-10-CM | POA: Diagnosis not present

## 2013-11-22 DIAGNOSIS — Z96649 Presence of unspecified artificial hip joint: Secondary | ICD-10-CM | POA: Diagnosis not present

## 2013-11-22 DIAGNOSIS — R262 Difficulty in walking, not elsewhere classified: Secondary | ICD-10-CM | POA: Diagnosis not present

## 2013-11-22 NOTE — Telephone Encounter (Signed)
It would be unusual for both feet to be affected at the same time.  She really should be seen.

## 2013-11-22 NOTE — Telephone Encounter (Signed)
Spoke with patient's daughter, Zella BallRobin is only able to bring patient in on Wed or Friday. Patient will see Dr.Pandey next Wed (12/01/2013) at 12:45 pm.

## 2013-11-23 DIAGNOSIS — M545 Low back pain, unspecified: Secondary | ICD-10-CM | POA: Diagnosis not present

## 2013-11-23 DIAGNOSIS — R279 Unspecified lack of coordination: Secondary | ICD-10-CM | POA: Diagnosis not present

## 2013-11-23 DIAGNOSIS — M48061 Spinal stenosis, lumbar region without neurogenic claudication: Secondary | ICD-10-CM | POA: Diagnosis not present

## 2013-11-23 DIAGNOSIS — R262 Difficulty in walking, not elsewhere classified: Secondary | ICD-10-CM | POA: Diagnosis not present

## 2013-11-23 DIAGNOSIS — Z96649 Presence of unspecified artificial hip joint: Secondary | ICD-10-CM | POA: Diagnosis not present

## 2013-11-24 DIAGNOSIS — M545 Low back pain, unspecified: Secondary | ICD-10-CM | POA: Diagnosis not present

## 2013-11-24 DIAGNOSIS — M48061 Spinal stenosis, lumbar region without neurogenic claudication: Secondary | ICD-10-CM | POA: Diagnosis not present

## 2013-11-24 DIAGNOSIS — R262 Difficulty in walking, not elsewhere classified: Secondary | ICD-10-CM | POA: Diagnosis not present

## 2013-11-24 DIAGNOSIS — R279 Unspecified lack of coordination: Secondary | ICD-10-CM | POA: Diagnosis not present

## 2013-11-24 DIAGNOSIS — Z96649 Presence of unspecified artificial hip joint: Secondary | ICD-10-CM | POA: Diagnosis not present

## 2013-11-25 ENCOUNTER — Encounter: Payer: Self-pay | Admitting: Internal Medicine

## 2013-11-26 ENCOUNTER — Encounter (HOSPITAL_COMMUNITY): Payer: Self-pay | Admitting: Emergency Medicine

## 2013-11-26 ENCOUNTER — Emergency Department (HOSPITAL_COMMUNITY)
Admission: EM | Admit: 2013-11-26 | Discharge: 2013-11-26 | Disposition: A | Discharge status: Home / Self Care | Payer: Medicare Other | Attending: Emergency Medicine | Admitting: Emergency Medicine

## 2013-11-26 ENCOUNTER — Emergency Department (HOSPITAL_COMMUNITY): Payer: Medicare Other

## 2013-11-26 DIAGNOSIS — R296 Repeated falls: Secondary | ICD-10-CM | POA: Insufficient documentation

## 2013-11-26 DIAGNOSIS — I951 Orthostatic hypotension: Secondary | ICD-10-CM | POA: Diagnosis not present

## 2013-11-26 DIAGNOSIS — S62339A Displaced fracture of neck of unspecified metacarpal bone, initial encounter for closed fracture: Secondary | ICD-10-CM | POA: Insufficient documentation

## 2013-11-26 DIAGNOSIS — J4489 Other specified chronic obstructive pulmonary disease: Secondary | ICD-10-CM | POA: Insufficient documentation

## 2013-11-26 DIAGNOSIS — F0280 Dementia in other diseases classified elsewhere without behavioral disturbance: Secondary | ICD-10-CM | POA: Insufficient documentation

## 2013-11-26 DIAGNOSIS — J45909 Unspecified asthma, uncomplicated: Secondary | ICD-10-CM | POA: Insufficient documentation

## 2013-11-26 DIAGNOSIS — G4700 Insomnia, unspecified: Secondary | ICD-10-CM | POA: Insufficient documentation

## 2013-11-26 DIAGNOSIS — Z79899 Other long term (current) drug therapy: Secondary | ICD-10-CM | POA: Insufficient documentation

## 2013-11-26 DIAGNOSIS — R4182 Altered mental status, unspecified: Secondary | ICD-10-CM | POA: Diagnosis not present

## 2013-11-26 DIAGNOSIS — L723 Sebaceous cyst: Secondary | ICD-10-CM | POA: Diagnosis not present

## 2013-11-26 DIAGNOSIS — M549 Dorsalgia, unspecified: Secondary | ICD-10-CM | POA: Diagnosis not present

## 2013-11-26 DIAGNOSIS — E119 Type 2 diabetes mellitus without complications: Secondary | ICD-10-CM | POA: Insufficient documentation

## 2013-11-26 DIAGNOSIS — IMO0002 Reserved for concepts with insufficient information to code with codable children: Secondary | ICD-10-CM | POA: Diagnosis not present

## 2013-11-26 DIAGNOSIS — I4430 Unspecified atrioventricular block: Secondary | ICD-10-CM | POA: Insufficient documentation

## 2013-11-26 DIAGNOSIS — M109 Gout, unspecified: Secondary | ICD-10-CM | POA: Insufficient documentation

## 2013-11-26 DIAGNOSIS — S43006A Unspecified dislocation of unspecified shoulder joint, initial encounter: Secondary | ICD-10-CM | POA: Insufficient documentation

## 2013-11-26 DIAGNOSIS — R32 Unspecified urinary incontinence: Secondary | ICD-10-CM | POA: Diagnosis not present

## 2013-11-26 DIAGNOSIS — F411 Generalized anxiety disorder: Secondary | ICD-10-CM | POA: Insufficient documentation

## 2013-11-26 DIAGNOSIS — Z888 Allergy status to other drugs, medicaments and biological substances status: Secondary | ICD-10-CM | POA: Insufficient documentation

## 2013-11-26 DIAGNOSIS — L0291 Cutaneous abscess, unspecified: Secondary | ICD-10-CM | POA: Insufficient documentation

## 2013-11-26 DIAGNOSIS — Y9389 Activity, other specified: Secondary | ICD-10-CM | POA: Insufficient documentation

## 2013-11-26 DIAGNOSIS — M545 Low back pain, unspecified: Secondary | ICD-10-CM | POA: Insufficient documentation

## 2013-11-26 DIAGNOSIS — F3289 Other specified depressive episodes: Secondary | ICD-10-CM | POA: Insufficient documentation

## 2013-11-26 DIAGNOSIS — S62309A Unspecified fracture of unspecified metacarpal bone, initial encounter for closed fracture: Secondary | ICD-10-CM

## 2013-11-26 DIAGNOSIS — F329 Major depressive disorder, single episode, unspecified: Secondary | ICD-10-CM | POA: Insufficient documentation

## 2013-11-26 DIAGNOSIS — K120 Recurrent oral aphthae: Secondary | ICD-10-CM | POA: Diagnosis not present

## 2013-11-26 DIAGNOSIS — T148XXA Other injury of unspecified body region, initial encounter: Secondary | ICD-10-CM | POA: Diagnosis not present

## 2013-11-26 DIAGNOSIS — R269 Unspecified abnormalities of gait and mobility: Secondary | ICD-10-CM | POA: Insufficient documentation

## 2013-11-26 DIAGNOSIS — R000 Tachycardia, unspecified: Secondary | ICD-10-CM | POA: Insufficient documentation

## 2013-11-26 DIAGNOSIS — T07XXXA Unspecified multiple injuries, initial encounter: Secondary | ICD-10-CM

## 2013-11-26 DIAGNOSIS — S46909A Unspecified injury of unspecified muscle, fascia and tendon at shoulder and upper arm level, unspecified arm, initial encounter: Secondary | ICD-10-CM | POA: Diagnosis not present

## 2013-11-26 DIAGNOSIS — J449 Chronic obstructive pulmonary disease, unspecified: Secondary | ICD-10-CM | POA: Insufficient documentation

## 2013-11-26 DIAGNOSIS — N905 Atrophy of vulva: Secondary | ICD-10-CM | POA: Diagnosis not present

## 2013-11-26 DIAGNOSIS — S4980XA Other specified injuries of shoulder and upper arm, unspecified arm, initial encounter: Secondary | ICD-10-CM | POA: Diagnosis not present

## 2013-11-26 DIAGNOSIS — G2581 Restless legs syndrome: Secondary | ICD-10-CM | POA: Diagnosis not present

## 2013-11-26 DIAGNOSIS — R350 Frequency of micturition: Secondary | ICD-10-CM | POA: Insufficient documentation

## 2013-11-26 DIAGNOSIS — M810 Age-related osteoporosis without current pathological fracture: Secondary | ICD-10-CM | POA: Diagnosis not present

## 2013-11-26 DIAGNOSIS — L0390 Cellulitis, unspecified: Secondary | ICD-10-CM

## 2013-11-26 DIAGNOSIS — F5102 Adjustment insomnia: Secondary | ICD-10-CM | POA: Diagnosis not present

## 2013-11-26 DIAGNOSIS — M4800 Spinal stenosis, site unspecified: Secondary | ICD-10-CM | POA: Insufficient documentation

## 2013-11-26 DIAGNOSIS — R609 Edema, unspecified: Secondary | ICD-10-CM | POA: Insufficient documentation

## 2013-11-26 DIAGNOSIS — S59909A Unspecified injury of unspecified elbow, initial encounter: Secondary | ICD-10-CM | POA: Diagnosis not present

## 2013-11-26 DIAGNOSIS — D649 Anemia, unspecified: Secondary | ICD-10-CM | POA: Diagnosis not present

## 2013-11-26 DIAGNOSIS — M1990 Unspecified osteoarthritis, unspecified site: Secondary | ICD-10-CM | POA: Insufficient documentation

## 2013-11-26 DIAGNOSIS — S62319A Displaced fracture of base of unspecified metacarpal bone, initial encounter for closed fracture: Secondary | ICD-10-CM | POA: Diagnosis not present

## 2013-11-26 DIAGNOSIS — I10 Essential (primary) hypertension: Secondary | ICD-10-CM | POA: Diagnosis not present

## 2013-11-26 DIAGNOSIS — M25539 Pain in unspecified wrist: Secondary | ICD-10-CM | POA: Diagnosis not present

## 2013-11-26 DIAGNOSIS — E559 Vitamin D deficiency, unspecified: Secondary | ICD-10-CM | POA: Insufficient documentation

## 2013-11-26 DIAGNOSIS — N289 Disorder of kidney and ureter, unspecified: Secondary | ICD-10-CM | POA: Insufficient documentation

## 2013-11-26 DIAGNOSIS — R51 Headache: Secondary | ICD-10-CM | POA: Insufficient documentation

## 2013-11-26 DIAGNOSIS — N181 Chronic kidney disease, stage 1: Secondary | ICD-10-CM | POA: Insufficient documentation

## 2013-11-26 DIAGNOSIS — Y9289 Other specified places as the place of occurrence of the external cause: Secondary | ICD-10-CM | POA: Insufficient documentation

## 2013-11-26 DIAGNOSIS — R627 Adult failure to thrive: Secondary | ICD-10-CM | POA: Insufficient documentation

## 2013-11-26 DIAGNOSIS — Z882 Allergy status to sulfonamides status: Secondary | ICD-10-CM | POA: Insufficient documentation

## 2013-11-26 MED ORDER — HYDROCODONE-ACETAMINOPHEN 5-325 MG PO TABS
1.0000 | ORAL_TABLET | Freq: Once | ORAL | Status: AC
  Administered 2013-11-26: 1 via ORAL
  Filled 2013-11-26: qty 1

## 2013-11-26 NOTE — ED Notes (Addendum)
Pt lost balance trying to hang something in closet and fell yesterday at her SNF. she landed on her R side. ems came to scene but she did not want to be transported. Today shes having more pain and there is some bruising to her R hand. She also c/o R hip pain so daughter decided to bring her for eval. She denies head injury or LOC. She is A&Ox4, mae

## 2013-11-26 NOTE — ED Provider Notes (Signed)
CSN: [id number]     Arrival date & time 11/26/13  1340 History   First MD Initiated Contact with Patient 11/26/13 1542     Chief Complaint  Patient presents with  . Fall     (Consider location/radiation/quality/duration/timing/severity/associated sxs/prior Treatment) Patient is a 80 y.o. female presenting with fall. The history is provided by the patient and a relative.  Fall   She injured herself yesterday when she fell while reaching up to put something in a closet. This caused her to step backwards, then fall. She feels the fall was related to her usual balance problem. She is undergoing physical therapy to help with her balance. She uses a walker for ambulation. Since the fall. She has had trouble using her walker, and noted pain in her right hand, right upper arm and her low back. She is using Norco, without relief of the pain. She did not eat much today because she is having trouble using her right hand. She is right-hand dominant. She denies headache, neck pain, or upper back, pain. There's been no chest pain. She's using her medications as directed. There are no other known modifying factors.   Past Medical History  Diagnosis Date  . COPD (chronic obstructive pulmonary disease)   . Hypertension   . Oral aphthae   . Orthostatic hypotension   . Senile osteoporosis   . Atrophy of vulva   . Unspecified urinary incontinence   . Urinary frequency   . Unspecified vitamin D deficiency   . Anxiety state, unspecified   . Restless legs syndrome (RLS)   . Unspecified disorder of kidney and ureter   . Atrioventricular block, unspecified   . Closed dislocation of shoulder, unspecified site   . Anemia, unspecified   . Altered mental status   . Lumbago   . Transient disorder of initiating or maintaining sleep   . Cellulitis and abscess of unspecified site   . Sebaceous cyst   . Insomnia, unspecified   . Dementia in conditions classified elsewhere without behavioral disturbance   .  Depressive disorder, not elsewhere classified   . Abnormality of gait   . Adult failure to thrive   . Headache(784.0)   . Type II or unspecified type diabetes mellitus without mention of complication, not stated as uncontrolled   . Gout, unspecified   . Extrinsic asthma, unspecified   . Chronic kidney disease, stage I   . Osteoarthrosis, unspecified whether generalized or localized, unspecified site   . Spinal stenosis, unspecified region other than cervical   . Edema   . Tachycardia, unspecified    Past Surgical History  Procedure Laterality Date  . Hip surgery      (R) hip replacement  . Cesarean section    . Abdominal hysterectomy      partial  . Back surgery      fusion  . Breast surgery      bilateral breast reduction  . Eye surgery      bilateral cataract  . Basal cell carcinoma excision    . Basal cell carcinoma excision      face  . Hip closed reduction Right 08/07/2013    Procedure: ATTEMPTED CLOSED MANIPULATION HIP;  Surgeon: Jodi Marbleavid A Thompson, MD;  [facility];  Service: Orthopedics;  Laterality: Right;  . Total hip revision Right 08/08/2013    Procedure: TOTAL HIP REVISION- right;  Surgeon: Velna OchsPeter G Dalldorf, MD;  Location: [facility] OR;  Service: Orthopedics;  Laterality: Right;   Family History  Problem  Relation Age of Onset  . Diabetes Mother   . Diabetes Father   . Stroke Sister   . Heart attack Brother    History  Substance Use Topics  . Smoking status: Never Smoker   . Smokeless tobacco: Not on file  . Alcohol Use: No   OB History   Grav Para Term Preterm Abortions TAB SAB Ect Mult Living                 Review of Systems  All other systems reviewed and are negative.      Allergies  Sulfa antibiotics; Clonidine derivatives; Lactose intolerance (gi); and Verapamil  Home Medications   Current Outpatient Rx  Name  Route  Sig  Dispense  Refill  . amLODipine (NORVASC) 10 MG tablet   Oral   Take 1 tablet (10 mg total) by mouth daily.   30  tablet   0   . clonazePAM (KLONOPIN) 1 MG tablet   Oral   Take 1 tablet (1 mg total) by mouth at bedtime.   30 tablet   5   . furosemide (LASIX) 20 MG tablet   Oral   Take 20 mg by mouth every other day.         . lisinopril (PRINIVIL,ZESTRIL) 20 MG tablet   Oral   Take 1 tablet (20 mg total) by mouth daily.   30 tablet   0   . potassium chloride SA (K-DUR,KLOR-CON) 20 MEQ tablet   Oral   Take 20 mEq by mouth every other day.         . senna-docusate (SENOKOT-S) 8.6-50 MG per tablet   Oral   Take 1 tablet by mouth daily.         Marland Kitchen tiZANidine (ZANAFLEX) 2 MG tablet   Oral   Take 2 mg by mouth daily.          Marland Kitchen triamcinolone cream (KENALOG) 0.5 %   Topical   Apply 1 application topically 2 (two) times daily. To rash on legs   30 g   3   . HYDROcodone-acetaminophen (NORCO/VICODIN) 5-325 MG per tablet   Oral   Take 1 tablet by mouth 2 (two) times daily.   60 tablet   0    BP 179/56  Pulse 54  Temp(Src) 97.8 F (36.6 C) (Oral)  Resp 21  SpO2 95% Physical Exam  Nursing note and vitals reviewed. Constitutional: She is oriented to person, place, and time. She appears well-developed.  Elderly frail  HENT:  Head: Normocephalic and atraumatic.  Right Ear: External ear normal.  Left Ear: External ear normal.  Mouth/Throat: Oropharynx is clear and moist.  Eyes: Conjunctivae and EOM are normal. Pupils are equal, round, and reactive to light.  Neck: Normal range of motion and phonation normal. Neck supple.  Cardiovascular: Normal rate, regular rhythm and intact distal pulses.   Pulmonary/Chest: Effort normal and breath sounds normal. She exhibits no tenderness.  Abdominal: Soft. She exhibits no distension. There is no tenderness. There is no guarding.  Musculoskeletal: Normal range of motion.  Mild left lumbar tenderness to palpation in the cervical, thoracic, or lumbar spines. Tenderness with deformity and swelling of the right ulnar hand. No right-hand  finger deformity, including rotation. Mild tenderness and swelling of the right dorsal wrist, without cord deformity. Mild tenderness of the right upper humerus without limitation of motion of the humerus or elbow  Neurological: She is alert and oriented to person, place, and time. She exhibits normal  muscle tone.  Skin: Skin is warm and dry.  Psychiatric: She has a normal mood and affect. Her behavior is normal. Judgment and thought content normal.    ED Course  Procedures (including critical care time) Medications  HYDROcodone-acetaminophen (NORCO/VICODIN) 5-325 MG per tablet 1 tablet (1 tablet Oral Given 11/26/13 1840)    Patient Vitals for the past 24 hrs:  BP Temp Temp src Pulse Resp SpO2  11/26/13 1900 182/71 mmHg - - 51 14 96 %  11/26/13 1830 179/56 mmHg - - 54 21 95 %  11/26/13 1810 210/62 mmHg - - 70 17 95 %  11/26/13 1630 177/69 mmHg - - 51 16 95 %  11/26/13 1615 175/47 mmHg - - 50 18 95 %  11/26/13 1414 147/62 mmHg 97.8 F (36.6 C) Oral 87 14 99 %    7:12 PM Reevaluation with update and discussion. After initial assessment and treatment, an updated evaluation reveals no additional complaints. She has tolerated food and water here. Findings discussed with patient, and her daughter, discussed limitations of evaluation, and answered questions. Shaundrea Carrigg L   Appropriate splinting process discussed with the orthopedic technician and his splint application was monitored by me.  Labs Review Labs Reviewed - No data to display Imaging Review Dg Lumbar Spine Complete  11/26/2013   CLINICAL DATA:  Larey Seat yesterday.  Back pain.  EXAM: LUMBAR SPINE - COMPLETE 4+ VIEW  COMPARISON:  12/22/2009  FINDINGS: There are postsurgical changes with hemi laminectomies at L4 and L5 and bilateral pedicle screws and interconnecting rods fusing L3-L4. Mature bone graft material extends from L4-S1. There is loss of vertebral height at L2 which is stable from the prior exam. There is loss of disc height  throughout the visualized spine. This is marked at L4-L5 and L5-S1. Bridging anterior osteophytes are noted along the lower thoracic spine to the L3 level. The bones are extensively demineralized.  There are dense vascular calcifications. A partly imaged right hip prosthesis appears well aligned.  IMPRESSION: No acute fracture or acute finding. Findings are essentially stable from the prior lumbar spine radiographs. Extensive degenerative changes and postsurgical changes as detailed.   Electronically Signed   By: Amie Portland M.D.   On: 11/26/2013 17:57   Dg Wrist Complete Right  11/26/2013   CLINICAL DATA:  Larey Seat yesterday.  Right wrist pain.  EXAM: RIGHT WRIST - COMPLETE 3+ VIEW  COMPARISON:  None.  FINDINGS: Old, healed fracture of the distal right radial metaphysis. No evidence of an acute wrist fracture. No dislocation. The bones are extensively demineralized.  IMPRESSION: No acute fracture or dislocation.   Electronically Signed   By: Amie Portland M.D.   On: 11/26/2013 17:53   Dg Humerus Right  11/26/2013   CLINICAL DATA:  Larey Seat yesterday.  Right shoulder pain.  EXAM: RIGHT HUMERUS - 2+ VIEW  COMPARISON:  12/23/2009  FINDINGS: There has been extensive remodeling of the humeral head since the prior study, which demonstrated a comminuted proximal humeral fracture. The humeral head is resorption is not flattened with underlying sclerosis. It abuts the posterior inferior margin of the glenoid. There is no convincing acute fracture. The elbow joint is normally aligned. The bones are diffusely demineralized.  IMPRESSION: No acute fracture or dislocation. Chronic changes related to the proximal right humeral fracture noted on the radiographs dated 12/23/2009.   Electronically Signed   By: Amie Portland M.D.   On: 11/26/2013 17:55   Dg Hand Complete Right  11/26/2013   CLINICAL DATA:  Fell yesterday. Multiple areas of pain. Right hand pain.  EXAM: RIGHT HAND - COMPLETE 3+ VIEW  COMPARISON:  None.  FINDINGS:  Subtle nondisplaced non comminuted oblique fracture of the distal fourth metacarpal.  No other evidence of an acute fracture. There is an old healed fracture of the distal radius.  There is significant arthropathic changes involving multiple joints.  Bones are extensively demineralized.  IMPRESSION: Nondisplaced fracture of the distal fourth right metacarpal. No other acute finding.   Electronically Signed   By: Amie Portland M.D.   On: 11/26/2013 17:53     EKG Interpretation None      MDM   Final diagnoses:  Metacarpal bone fracture  Contusion, multiple sites  Osteoporosis    Mechanical fall with muscle skeletal injuries. No evidence for serious fracture or indication for further evaluation at this time  Nursing Notes Reviewed/ Care Coordinated Applicable Imaging Reviewed Interpretation of Laboratory Data incorporated into ED treatment  The patient appears reasonably screened and/or stabilized for discharge and I doubt any other medical condition or other [facility] requiring further screening, evaluation, or treatment in the ED at this time prior to discharge.  Plan: Home Medications- usual; Home Treatments- rest; return here if the recommended treatment, does not improve the symptoms; Recommended follow up- PCP or Ortho 7-10 days   Flint Melter, MD 11/26/13 [phone]

## 2013-11-26 NOTE — Progress Notes (Signed)
Orthopedic Tech Progress Note Patient Details:  [name] 02/07/1916 [id number]  Ortho Devices Type of Ortho Device: Ace wrap;Volar splint Ortho Device/Splint Location: RUE Ortho Device/Splint Interventions: Ordered;Application   Jennye MoccasinHughes, [name] 11/26/2013, 6:57 PM

## 2013-11-26 NOTE — Discharge Instructions (Signed)
Please assist the patient with activities of daily living; such as bathing, dressing, ambulation with walker and eating; until her splint is removed.  Followup with orthopedics, in 7-10 days, for evaluation of the right hand fracture.     Cast or Splint Care Casts and splints support injured limbs and keep bones from moving while they heal.  HOME CARE  Keep the cast or splint uncovered during the drying period.  A plaster cast can take 24 to 48 hours to dry.  A fiberglass cast will dry in less than 1 hour.  Do not rest the cast on anything harder than a pillow for 24 hours.  Do not put weight on your injured limb. Do not put pressure on the cast. Wait for your doctor's approval.  Keep the cast or splint dry.  Cover the cast or splint with a plastic bag during baths or wet weather.  If you have a cast over your chest and belly (trunk), take sponge baths until the cast is taken off.  If your cast gets wet, dry it with a towel or blow dryer. Use the cool setting on the blow dryer.  Keep your cast or splint clean. Wash a dirty cast with a damp cloth.  Do not put any objects under your cast or splint.  Do not scratch the skin under the cast with an object. If itching is a problem, use a blow dryer on a cool setting over the itchy area.  Do not trim or cut your cast.  Do not take out the padding from inside your cast.  Exercise your joints near the cast as told by your doctor.  Raise (elevate) your injured limb on 1 or 2 pillows for the first 1 to 3 days. GET HELP IF:  Your cast or splint cracks.  Your cast or splint is too tight or too loose.  You itch badly under the cast.  Your cast gets wet or has a soft spot.  You have a bad smell coming from the cast.  You get an object stuck under the cast.  Your skin around the cast becomes red or sore.  You have new or more pain after the cast is put on. GET HELP RIGHT AWAY IF:  You have fluid leaking through the  cast.  You cannot move your fingers or toes.  Your fingers or toes turn blue or white or are cool, painful, or puffy (swollen).  You have tingling or lose feeling (numbness) around the injured area.  You have bad pain or pressure under the cast.  You have trouble breathing or have shortness of breath.  You have chest pain. Document Released: 01/14/2011 Document Revised: 05/17/2013 Document Reviewed: 03/23/2013 [facility] Patient Information 2014 [place].  Contusion A contusion is a deep bruise. Contusions are the result of an injury that caused bleeding under the skin. The contusion may turn blue, purple, or yellow. Minor injuries will give you a painless contusion, but more severe contusions may stay painful and swollen for a few weeks.  CAUSES  A contusion is usually caused by a blow, trauma, or direct force to an area of the body. SYMPTOMS   Swelling and redness of the injured area.  Bruising of the injured area.  Tenderness and soreness of the injured area.  Pain. DIAGNOSIS  The diagnosis can be made by taking a history and physical exam. An X-ray, CT scan, or MRI may be needed to determine if there were any associated injuries, such as  fractures. TREATMENT  Specific treatment will depend on what area of the body was injured. In general, the best treatment for a contusion is resting, icing, elevating, and applying cold compresses to the injured area. Over-the-counter medicines may also be recommended for pain control. Ask your caregiver what the best treatment is for your contusion. HOME CARE INSTRUCTIONS   Put ice on the injured area.  Put ice in a plastic bag.  Place a towel between your skin and the bag.  Leave the ice on for 15-20 minutes, 03-04 times a day.  Only take over-the-counter or prescription medicines for pain, discomfort, or fever as directed by your caregiver. Your caregiver may recommend avoiding anti-inflammatory medicines (aspirin, ibuprofen,  and naproxen) for 48 hours because these medicines may increase bruising.  Rest the injured area.  If possible, elevate the injured area to reduce swelling. SEEK IMMEDIATE MEDICAL CARE IF:   You have increased bruising or swelling.  You have pain that is getting worse.  Your swelling or pain is not relieved with medicines. MAKE SURE YOU:   Understand these instructions.  Will watch your condition.  Will get help right away if you are not doing well or get worse. Document Released: 06/24/2005 Document Revised: 12/07/2011 Document Reviewed: 07/20/2011 [facility] Patient Information 2014 [place], [place].

## 2013-11-27 DIAGNOSIS — I951 Orthostatic hypotension: Secondary | ICD-10-CM | POA: Diagnosis not present

## 2013-11-27 DIAGNOSIS — S62339A Displaced fracture of neck of unspecified metacarpal bone, initial encounter for closed fracture: Secondary | ICD-10-CM | POA: Diagnosis not present

## 2013-11-27 DIAGNOSIS — Z96649 Presence of unspecified artificial hip joint: Secondary | ICD-10-CM | POA: Diagnosis not present

## 2013-11-27 DIAGNOSIS — M6281 Muscle weakness (generalized): Secondary | ICD-10-CM | POA: Diagnosis not present

## 2013-11-27 DIAGNOSIS — M810 Age-related osteoporosis without current pathological fracture: Secondary | ICD-10-CM | POA: Diagnosis not present

## 2013-11-27 DIAGNOSIS — M25529 Pain in unspecified elbow: Secondary | ICD-10-CM | POA: Diagnosis not present

## 2013-11-27 DIAGNOSIS — M48061 Spinal stenosis, lumbar region without neurogenic claudication: Secondary | ICD-10-CM | POA: Diagnosis not present

## 2013-11-27 DIAGNOSIS — M545 Low back pain, unspecified: Secondary | ICD-10-CM | POA: Diagnosis not present

## 2013-11-27 DIAGNOSIS — R262 Difficulty in walking, not elsewhere classified: Secondary | ICD-10-CM | POA: Diagnosis not present

## 2013-11-27 DIAGNOSIS — R279 Unspecified lack of coordination: Secondary | ICD-10-CM | POA: Diagnosis not present

## 2013-11-29 ENCOUNTER — Ambulatory Visit: Payer: Medicare Other | Admitting: Internal Medicine

## 2013-12-08 DIAGNOSIS — M19019 Primary osteoarthritis, unspecified shoulder: Secondary | ICD-10-CM | POA: Diagnosis not present

## 2013-12-08 DIAGNOSIS — M19049 Primary osteoarthritis, unspecified hand: Secondary | ICD-10-CM | POA: Diagnosis not present

## 2013-12-21 ENCOUNTER — Other Ambulatory Visit: Payer: Self-pay | Admitting: *Deleted

## 2013-12-21 MED ORDER — HYDROCODONE-ACETAMINOPHEN 5-325 MG PO TABS: 1.0000 | ORAL_TABLET | Freq: Two times a day (BID) | ORAL | Status: DC

## 2013-12-21 NOTE — Telephone Encounter (Signed)
[facility]

## 2013-12-27 DIAGNOSIS — R262 Difficulty in walking, not elsewhere classified: Secondary | ICD-10-CM | POA: Diagnosis not present

## 2013-12-27 DIAGNOSIS — R279 Unspecified lack of coordination: Secondary | ICD-10-CM | POA: Diagnosis not present

## 2013-12-27 DIAGNOSIS — S62339A Displaced fracture of neck of unspecified metacarpal bone, initial encounter for closed fracture: Secondary | ICD-10-CM | POA: Diagnosis not present

## 2013-12-27 DIAGNOSIS — M545 Low back pain, unspecified: Secondary | ICD-10-CM | POA: Diagnosis not present

## 2013-12-27 DIAGNOSIS — M25529 Pain in unspecified elbow: Secondary | ICD-10-CM | POA: Diagnosis not present

## 2013-12-27 DIAGNOSIS — Z96649 Presence of unspecified artificial hip joint: Secondary | ICD-10-CM | POA: Diagnosis not present

## 2013-12-27 DIAGNOSIS — I951 Orthostatic hypotension: Secondary | ICD-10-CM | POA: Diagnosis not present

## 2013-12-27 DIAGNOSIS — M6281 Muscle weakness (generalized): Secondary | ICD-10-CM | POA: Diagnosis not present

## 2013-12-27 DIAGNOSIS — M48061 Spinal stenosis, lumbar region without neurogenic claudication: Secondary | ICD-10-CM | POA: Diagnosis not present

## 2013-12-27 DIAGNOSIS — M810 Age-related osteoporosis without current pathological fracture: Secondary | ICD-10-CM | POA: Diagnosis not present

## 2014-01-01 ENCOUNTER — Ambulatory Visit (INDEPENDENT_AMBULATORY_CARE_PROVIDER_SITE_OTHER): Payer: Medicare Other | Admitting: Internal Medicine

## 2014-01-01 ENCOUNTER — Encounter: Payer: Self-pay | Admitting: Internal Medicine

## 2014-01-01 VITALS — BP 112/72 | HR 75 | Temp 97.4°F | Resp 18 | Ht <= 58 in | Wt 96.0 lb

## 2014-01-01 DIAGNOSIS — R609 Edema, unspecified: Secondary | ICD-10-CM | POA: Diagnosis not present

## 2014-01-01 DIAGNOSIS — G309 Alzheimer's disease, unspecified: Secondary | ICD-10-CM | POA: Diagnosis not present

## 2014-01-01 DIAGNOSIS — R627 Adult failure to thrive: Secondary | ICD-10-CM | POA: Diagnosis not present

## 2014-01-01 DIAGNOSIS — K5909 Other constipation: Secondary | ICD-10-CM

## 2014-01-01 DIAGNOSIS — F32A Depression, unspecified: Secondary | ICD-10-CM

## 2014-01-01 DIAGNOSIS — K5900 Constipation, unspecified: Secondary | ICD-10-CM

## 2014-01-01 DIAGNOSIS — F329 Major depressive disorder, single episode, unspecified: Secondary | ICD-10-CM

## 2014-01-01 DIAGNOSIS — F3289 Other specified depressive episodes: Secondary | ICD-10-CM | POA: Diagnosis not present

## 2014-01-01 DIAGNOSIS — S43006A Unspecified dislocation of unspecified shoulder joint, initial encounter: Secondary | ICD-10-CM

## 2014-01-01 DIAGNOSIS — F0280 Dementia in other diseases classified elsewhere without behavioral disturbance: Secondary | ICD-10-CM | POA: Diagnosis not present

## 2014-01-01 NOTE — Progress Notes (Signed)
Patient ID: [name], female   DOB: 20-Feb-1916, 80 y.o.   MRN: [id number]   Location:  [facility]  Code Status: DNR   Allergies  Allergen Reactions  . Sulfa Antibiotics Shortness Of Breath    Worsens asthma  . Clonidine Derivatives   . Lactose Intolerance (Gi)   . Verapamil     [facility]    Chief Complaint  Patient presents with  . Follow-up    HPI: Patient is a 80 y.o. white female seen in the office today for medical mgt of chronic diseases.  Arm hurts, but no different than baseline.  Has therapy when returns--OT is helping her mind and her pain.  Was home with her daughter for the weekend for Easter.  Ate very well there.  Gained weight.    Doesn't sleep well, she says, but her daughter says she slept well at her house.  Pt says she sleeps 5 hours.    Eye drops are helping her read more.  She had very dry eyes. Natural tears make all of the difference.    Has been needing the lisinopril and amlodipine regularly now for blood pressure.    Legs are not swollen.  Weights have been stable--only weight gain due to better po intake.  Using cane more and walker less.    Review of Systems:  Review of Systems  Constitutional: Negative for fever, chills and weight loss.  HENT: Negative for congestion.   Eyes: Positive for blurred vision.  Respiratory: Negative for shortness of breath.   Cardiovascular: Negative for chest pain and leg swelling.  Gastrointestinal: Negative for abdominal pain and constipation.  Genitourinary: Negative for dysuria.  Musculoskeletal: Positive for joint pain.  Skin: Negative for rash.  Neurological: Negative for dizziness.  Endo/Heme/Allergies: Bruises/bleeds easily.  Psychiatric/Behavioral: Positive for memory loss. Negative for depression.    Past Medical History  Diagnosis Date  . COPD (chronic obstructive pulmonary disease)   . Hypertension   . Oral aphthae   . Orthostatic hypotension   .  Senile osteoporosis   . Atrophy of vulva   . Unspecified urinary incontinence   . Urinary frequency   . Unspecified vitamin D deficiency   . Anxiety state, unspecified   . Restless legs syndrome (RLS)   . Unspecified disorder of kidney and ureter   . Atrioventricular block, unspecified   . Closed dislocation of shoulder, unspecified site   . Anemia, unspecified   . Altered mental status   . Lumbago   . Transient disorder of initiating or maintaining sleep   . Cellulitis and abscess of unspecified site   . Sebaceous cyst   . Insomnia, unspecified   . Dementia in conditions classified elsewhere without behavioral disturbance   . Depressive disorder, not elsewhere classified   . Abnormality of gait   . Adult failure to thrive   . Headache(784.0)   . Type II or unspecified type diabetes mellitus without mention of complication, not stated as uncontrolled   . Gout, unspecified   . Extrinsic asthma, unspecified   . Chronic kidney disease, stage I   . Osteoarthrosis, unspecified whether generalized or localized, unspecified site   . Spinal stenosis, unspecified region other than cervical   . Edema   . Tachycardia, unspecified     Past Surgical History  Procedure Laterality Date  . Hip surgery      (R) hip replacement  . Cesarean section    . Abdominal hysterectomy  partial  . Back surgery      fusion  . Breast surgery      bilateral breast reduction  . Eye surgery      bilateral cataract  . Basal cell carcinoma excision    . Basal cell carcinoma excision      face  . Hip closed reduction Right 08/07/2013    Procedure: ATTEMPTED CLOSED MANIPULATION HIP;  Surgeon: Jodi Marble, MD;  Location: [facility] OR;  Service: Orthopedics;  Laterality: Right;  . Total hip revision Right 08/08/2013    Procedure: TOTAL HIP REVISION- right;  Surgeon: Velna Ochs, MD;  Location: [facility] OR;  Service: Orthopedics;  Laterality: Right;    Social History:   reports that she has never  smoked. She does not have any smokeless tobacco history on file. She reports that she does not drink alcohol or use illicit drugs.  Family History  Problem Relation Age of Onset  . Diabetes Mother   . Diabetes Father   . Stroke Sister   . Heart attack Brother     Medications: Patient's Medications  New Prescriptions   No medications on file  Previous Medications   AMLODIPINE (NORVASC) 10 MG TABLET    Take 1 tablet (10 mg total) by mouth daily.   CLONAZEPAM (KLONOPIN) 1 MG TABLET    Take 1 tablet (1 mg total) by mouth at bedtime.   FUROSEMIDE (LASIX) 20 MG TABLET    Take 20 mg by mouth every other day.   HYDROCODONE-ACETAMINOPHEN (NORCO/VICODIN) 5-325 MG PER TABLET    Take 1 tablet by mouth 2 (two) times daily.   LISINOPRIL (PRINIVIL,ZESTRIL) 20 MG TABLET    Take 1 tablet (20 mg total) by mouth daily.   POLYVINYL ALCOHOL (LIQUIFILM TEARS) 1.4 % OPHTHALMIC SOLUTION    Place 1 drop into both eyes as needed for dry eyes.   POTASSIUM CHLORIDE SA (K-DUR,KLOR-CON) 20 MEQ TABLET    Take 20 mEq by mouth every other day.   SENNA-DOCUSATE (SENOKOT-S) 8.6-50 MG PER TABLET    Take 1 tablet by mouth daily.   TIZANIDINE (ZANAFLEX) 2 MG TABLET    Take 2 mg by mouth daily.    TRIAMCINOLONE CREAM (KENALOG) 0.5 %    Apply 1 application topically 2 (two) times daily. To rash on legs  Modified Medications   No medications on file  Discontinued Medications   No medications on file     Physical Exam: Filed Vitals:   01/01/14 1039  BP: 112/72  Pulse: 75  Temp: 97.4 F (36.3 C)  TempSrc: Oral  Resp: 18  Height: 4\' 8"  (1.422 m)  Weight: 96 lb (43.545 kg)  SpO2: 96%  Physical Exam  Constitutional: No distress.  Has gained weight--looks better  HENT:  Head: Normocephalic and atraumatic.  Eyes:  Wears glasses  Cardiovascular: Normal rate, regular rhythm, normal heart sounds and intact distal pulses.   Pulmonary/Chest: Effort normal and breath sounds normal. No respiratory distress.    Abdominal: Soft. Bowel sounds are normal. She exhibits no distension and no mass. There is no tenderness.  Musculoskeletal:  Wearing brace on right wrist     Labs reviewed: Basic Metabolic Panel:  Recent Labs  16/10/96 0602 08/12/13 0421  08/14/13 0625 09/05/13 0915 11/02/13 1112  NA 141 143  < > 143 142 143  K 4.0 3.9  < > 3.6 4.3 SPECIMEN/CONTAINER TYPE INAPPROPRIATE FOR ORDERED TEST, UNABLE TO PERFORM  CL 107 105  < > 101 101 96  CO2  26 27  < > 33* 24 27  GLUCOSE 109* 87  < > 104* 89 101*  BUN 16 14  < > 12 14 19   CREATININE 0.72 0.64  < > 0.65 0.71 0.67  CALCIUM 7.9* 8.8  < > 9.4 9.1 SPECIMEN/CONTAINER TYPE INAPPROPRIATE FOR ORDERED TEST, UNABLE TO PERFORM  MG  --  1.4*  --  1.9  --   --   < > = values in this interval not displayed. Liver Function Tests:  Recent Labs  11/02/13 1112  AST 20  ALT 15  ALKPHOS 12*  BILITOT 0.6  PROT 7.2  ALBUMIN 4.1   No results found for this basename: LIPASE, AMYLASE,  in the last 8760 hours No results found for this basename: AMMONIA,  in the last 8760 hours CBC:  Recent Labs  08/07/13 2149  08/11/13 0602 08/12/13 0421 11/02/13 1112  WBC 6.5  < > 7.6 7.4 8.0  NEUTROABS 4.6  --   --   --  5.8  HGB 11.6*  < > 9.9* 11.7* 14.8  HCT 34.8*  < > 30.6* 35.5* 42.9  MCV 89.2  < > 89.7 89.6 85.6  PLT 219  < > 197 256 131*  < > = values in this interval not displayed. Lipid Panel:last lipids normal--not rechecking anymore at her age and with her dementia and goals of care  Assessment/Plan 1. Alzheimer's disease -improved lately with more socialization doing therapy -gets help with meds and bathing, dressing  2. Depression -mood much better -has clonazepam for anxiety  3. Failure to thrive in adult -weight improved, eating better lately  4. Edema -stable--no significant edema at all today--cont lasix and elevating legs  5. Closed dislocation of shoulder, unspecified site -has chronic pain in right arm--now using brace  on right wrist -cont pt, ot for this and her balance  6. Chronic constipation -well controlled at this time   BP well controlled--getting checked twice a day and no longer needs this--check every other day.  Has daily weights and stable so will reduced to qod.  Next appt:  3 mos

## 2014-01-05 ENCOUNTER — Telehealth: Payer: Self-pay | Admitting: *Deleted

## 2014-01-05 NOTE — Telephone Encounter (Signed)
[name] with [facility] states patient's BP is 186/874 Pulse 48 and BP medication given and now Pulse is 40. Patient is very lethargic and no energy. States doesn't feel well but refuses to go to ER. [name] wanted to know what to do. I told her to go ahead and call the ambulance and have them come out and evaluate and see if she needs to go to the ER. She agreed and will call.

## 2014-01-09 ENCOUNTER — Other Ambulatory Visit: Payer: Self-pay | Admitting: *Deleted

## 2014-01-09 DIAGNOSIS — R21 Rash and other nonspecific skin eruption: Secondary | ICD-10-CM

## 2014-01-09 MED ORDER — TRIAMCINOLONE ACETONIDE 0.5 % EX CREA: 1.0000 "application " | TOPICAL_CREAM | Freq: Two times a day (BID) | CUTANEOUS | Status: DC

## 2014-01-09 NOTE — Telephone Encounter (Signed)
[facility]

## 2014-01-18 ENCOUNTER — Other Ambulatory Visit: Payer: Self-pay | Admitting: *Deleted

## 2014-01-18 MED ORDER — HYDROCODONE-ACETAMINOPHEN 5-325 MG PO TABS: 1.0000 | ORAL_TABLET | Freq: Two times a day (BID) | ORAL | Status: DC

## 2014-01-18 NOTE — Telephone Encounter (Signed)
[facility]

## 2014-02-01 ENCOUNTER — Other Ambulatory Visit: Payer: Self-pay | Admitting: *Deleted

## 2014-02-01 MED ORDER — POLYETHYL GLYCOL-PROPYL GLYCOL 0.4-0.3 % OP SOLN: OPHTHALMIC | Status: DC

## 2014-02-21 ENCOUNTER — Other Ambulatory Visit: Payer: Self-pay | Admitting: *Deleted

## 2014-02-21 MED ORDER — HYDROCODONE-ACETAMINOPHEN 5-325 MG PO TABS: ORAL_TABLET | ORAL | Status: DC

## 2014-02-21 NOTE — Telephone Encounter (Signed)
[facility]

## 2014-02-27 ENCOUNTER — Other Ambulatory Visit: Payer: Self-pay | Admitting: *Deleted

## 2014-02-27 MED ORDER — CLONAZEPAM 1 MG PO TABS: ORAL_TABLET | ORAL | Status: DC

## 2014-02-27 NOTE — Telephone Encounter (Signed)
[facility]

## 2014-03-16 DIAGNOSIS — S81809A Unspecified open wound, unspecified lower leg, initial encounter: Secondary | ICD-10-CM

## 2014-03-16 DIAGNOSIS — S81009A Unspecified open wound, unspecified knee, initial encounter: Secondary | ICD-10-CM

## 2014-03-16 DIAGNOSIS — S91009A Unspecified open wound, unspecified ankle, initial encounter: Secondary | ICD-10-CM

## 2014-03-16 DIAGNOSIS — M48062 Spinal stenosis, lumbar region with neurogenic claudication: Secondary | ICD-10-CM

## 2014-03-16 DIAGNOSIS — I951 Orthostatic hypotension: Secondary | ICD-10-CM

## 2014-03-16 DIAGNOSIS — R279 Unspecified lack of coordination: Secondary | ICD-10-CM

## 2014-03-20 ENCOUNTER — Other Ambulatory Visit: Payer: Self-pay | Admitting: *Deleted

## 2014-03-20 MED ORDER — HYDROCODONE-ACETAMINOPHEN 5-325 MG PO TABS: ORAL_TABLET | ORAL | Status: DC

## 2014-03-20 NOTE — Telephone Encounter (Signed)
[facility]

## 2014-04-02 ENCOUNTER — Ambulatory Visit (HOSPITAL_COMMUNITY): Payer: Medicaid - Dental | Admitting: Dentistry

## 2014-04-02 ENCOUNTER — Encounter (HOSPITAL_COMMUNITY): Payer: Self-pay | Admitting: Dentistry

## 2014-04-02 ENCOUNTER — Encounter (INDEPENDENT_AMBULATORY_CARE_PROVIDER_SITE_OTHER): Payer: Self-pay

## 2014-04-02 VITALS — BP 127/50 | HR 41 | Temp 97.8°F

## 2014-04-02 DIAGNOSIS — K089 Disorder of teeth and supporting structures, unspecified: Secondary | ICD-10-CM

## 2014-04-02 DIAGNOSIS — K036 Deposits [accretions] on teeth: Secondary | ICD-10-CM

## 2014-04-02 DIAGNOSIS — K117 Disturbances of salivary secretion: Secondary | ICD-10-CM

## 2014-04-02 DIAGNOSIS — K0530 Chronic periodontitis, unspecified: Secondary | ICD-10-CM

## 2014-04-02 DIAGNOSIS — IMO0002 Reserved for concepts with insufficient information to code with codable children: Secondary | ICD-10-CM

## 2014-04-02 DIAGNOSIS — R682 Dry mouth, unspecified: Secondary | ICD-10-CM

## 2014-04-02 DIAGNOSIS — K0820 Unspecified atrophy of edentulous alveolar ridge: Secondary | ICD-10-CM

## 2014-04-02 DIAGNOSIS — K029 Dental caries, unspecified: Secondary | ICD-10-CM

## 2014-04-02 DIAGNOSIS — Z98811 Dental restoration status: Secondary | ICD-10-CM

## 2014-04-02 DIAGNOSIS — K08409 Partial loss of teeth, unspecified cause, unspecified class: Secondary | ICD-10-CM

## 2014-04-02 DIAGNOSIS — K0889 Other specified disorders of teeth and supporting structures: Secondary | ICD-10-CM

## 2014-04-02 DIAGNOSIS — M264 Malocclusion, unspecified: Secondary | ICD-10-CM

## 2014-04-02 DIAGNOSIS — I10 Essential (primary) hypertension: Secondary | ICD-10-CM

## 2014-04-02 NOTE — Patient Instructions (Signed)
Patient is to brush after meals and at bedtime.  Patient instructed to floss at bedtime. Patient is to return to clinic for dental resin restoration of tooth #7 in the future. Patient will then be seen approximately 6 and half months for an exam and cleaning in 2016. Patient to call if problems arise before then. Charlynne Panderonald F. Kristiann Noyce, DDS

## 2014-04-02 NOTE — Progress Notes (Signed)
DENTAL CONSULTATION  Date of Consultation:  04/02/2014 Patient Name:   [name] Date of Birth:   November 14, 1915 Medical Record Number: [id number]  VITALS: BP 127/50  Pulse 41  Temp(Src) 97.8 F (36.6 C) (Oral)   CHIEF COMPLAINT: Patient seen for comprehensive dental examination, radiographs, and periodontal therapy.  HPI: [name] is a 80 year old referred by her primary physician, Dr. Bufford Spikes, for comprehensive dental examination, radiographs, and periodontal therapy as needed. The patient has multiple medical problems and currently resides at [facility]. The patient does have a previous right hip replacement and Dr. Jerl Santos was contacted and indicated that antibiotic premedication prior to invasive dental procedures was NOT indicated.  The patient currently denies acute toothache, swellings, or abscesses. Patient was last seen by her daughter's dentist for an exam and cleaning approximately one year ago. Patient is usually seen on a twice a year basis. The patient denies having any partial dentures at this time.   PROBLEM LIST: Patient Active Problem List   Diagnosis Date Noted  . Severe malnutrition 08/14/2013    Class: Chronic  . PNA (pneumonia) 08/10/2013  . Cough 08/09/2013  . Failed total hip arthroplasty with dislocation 08/08/2013    Class: Acute  . Other malaise and fatigue 03/25/2013  . Senile osteoporosis   . Unspecified vitamin D deficiency   . Closed dislocation of shoulder, unspecified site   . Spinal stenosis, lumbar region, with neurogenic claudication   . Alzheimer's disease 02/06/2010  . HYPERTENSION 02/06/2010  . ASTHMA, UNSPECIFIED, UNSPECIFIED STATUS 02/06/2010  . ABNORMAL EKG 02/06/2010    PMH: Past Medical History  Diagnosis Date  . COPD (chronic obstructive pulmonary disease)   . Hypertension   . Oral aphthae   . Orthostatic hypotension   . Senile osteoporosis   . Atrophy of vulva   . Unspecified urinary  incontinence   . Urinary frequency   . Unspecified vitamin D deficiency   . Anxiety state, unspecified   . Restless legs syndrome (RLS)   . Unspecified disorder of kidney and ureter   . Atrioventricular block, unspecified   . Closed dislocation of shoulder, unspecified site   . Anemia, unspecified   . Altered mental status   . Lumbago   . Transient disorder of initiating or maintaining sleep   . Cellulitis and abscess of unspecified site   . Sebaceous cyst   . Insomnia, unspecified   . Dementia in conditions classified elsewhere without behavioral disturbance   . Depressive disorder, not elsewhere classified   . Abnormality of gait   . Adult failure to thrive   . Headache(784.0)   . Type II or unspecified type diabetes mellitus without mention of complication, not stated as uncontrolled   . Gout, unspecified   . Extrinsic asthma, unspecified   . Chronic kidney disease, stage I   . Osteoarthrosis, unspecified whether generalized or localized, unspecified site   . Spinal stenosis, unspecified region other than cervical   . Edema   . Tachycardia, unspecified     PSH: Past Surgical History  Procedure Laterality Date  . Hip surgery      (R) hip replacement  . Cesarean section    . Abdominal hysterectomy      partial  . Back surgery      fusion  . Breast surgery      bilateral breast reduction  . Eye surgery      bilateral cataract  . Basal cell carcinoma excision    .  Basal cell carcinoma excision      face  . Hip closed reduction Right 08/07/2013    Procedure: ATTEMPTED CLOSED MANIPULATION HIP;  Surgeon: Jodi Marbleavid A Thompson, MD;  [facility];  Service: Orthopedics;  Laterality: Right;  . Total hip revision Right 08/08/2013    Procedure: TOTAL HIP REVISION- right;  Surgeon: Velna OchsPeter G Dalldorf, MD;  Location: [facility] OR;  Service: Orthopedics;  Laterality: Right;    ALLERGIES: Allergies  Allergen Reactions  . Sulfa Antibiotics Shortness Of Breath    Worsens asthma  .  Clonidine Derivatives   . Lactose Intolerance (Gi)   . Verapamil     [facility] MAR    MEDICATIONS: Current Outpatient Prescriptions  Medication Sig Dispense Refill  . amLODipine (NORVASC) 10 MG tablet Take 1 tablet (10 mg total) by mouth daily.  30 tablet  0  . clonazePAM (KLONOPIN) 1 MG tablet Take one tablet by mouth at bedtime for anxiety  30 tablet  5  . furosemide (LASIX) 20 MG tablet Take 20 mg by mouth every other day.      Marland Kitchen. HYDROcodone-acetaminophen (NORCO/VICODIN) 5-325 MG per tablet Take one tablet by mouth twice daily for pain  60 tablet  0  . lisinopril (PRINIVIL,ZESTRIL) 20 MG tablet Take 1 tablet (20 mg total) by mouth daily.  30 tablet  0  . Polyethyl Glycol-Propyl Glycol (SYSTANE) 0.4-0.3 % SOLN Instill one drop into each eye two times daily to relieve dry,itchy eye.  30 mL  5  . potassium chloride SA (K-DUR,KLOR-CON) 20 MEQ tablet Take 20 mEq by mouth every other day.      . senna-docusate (SENOKOT-S) 8.6-50 MG per tablet Take 1 tablet by mouth daily.      Marland Kitchen. tiZANidine (ZANAFLEX) 2 MG tablet Take 2 mg by mouth daily.       Marland Kitchen. triamcinolone cream (KENALOG) 0.5 % Apply 1 application topically 2 (two) times daily. To rash on legs  30 g  3  . polyvinyl alcohol (LIQUIFILM TEARS) 1.4 % ophthalmic solution Place 1 drop into both eyes as needed for dry eyes.       No current facility-administered medications for this visit.    LABS: Lab Results  Component Value Date   WBC 8.0 11/02/2013   HGB 14.8 11/02/2013   HCT 42.9 11/02/2013   MCV 85.6 11/02/2013   PLT 131* 11/02/2013      Component Value Date/Time   NA 143 11/02/2013 1112   NA 142 09/05/2013 0915   K SPECIMEN/CONTAINER TYPE INAPPROPRIATE FOR ORDERED TEST, UNABLE TO PERFORM 11/02/2013 1112   CL 96 11/02/2013 1112   CO2 27 11/02/2013 1112   GLUCOSE 101* 11/02/2013 1112   GLUCOSE 89 09/05/2013 0915   BUN 19 11/02/2013 1112   BUN 14 09/05/2013 0915   CREATININE 0.67 11/02/2013 1112   CALCIUM SPECIMEN/CONTAINER TYPE INAPPROPRIATE FOR ORDERED  TEST, UNABLE TO PERFORM 11/02/2013 1112   GFRNONAA 72* 11/02/2013 1112   GFRAA 84* 11/02/2013 1112   Lab Results  Component Value Date   INR 1.4 05/03/2009   INR 1.3 05/02/2009   INR 1.1 05/01/2009   No results found for this basename: PTT    SOCIAL HISTORY: History   Social History  . Marital Status: Widowed    Spouse Name: N/A    Number of Children: N/A  . Years of Education: N/A   Occupational History  . Not on file.   Social History Main Topics  . Smoking status: Never Smoker   . Smokeless  tobacco: Not on file  . Alcohol Use: No  . Drug Use: No  . Sexual Activity: Not on file   Other Topics Concern  . Not on file   Social History Narrative  . No narrative on file    FAMILY HISTORY: Family History  Problem Relation Age of Onset  . Diabetes Mother   . Diabetes Father   . Stroke Sister   . Heart attack Brother      REVIEW OF SYSTEMS: Reviewed with patient and positive as above.   DENTAL HISTORY: CHIEF COMPLAINT: Patient seen for comprehensive dental examination, radiographs, and periodontal therapy.  HPI: [name] is a 80 year old referred by her primary physician, Dr. Bufford Spikesiffany Reed, for comprehensive dental examination, radiographs, and periodontal therapy as needed. The patient has multiple medical problems and currently resides at [facility]. The patient does have a previous right hip replacement and Dr. Jerl Santosalldorf was contacted and indicated that antibiotic premedication prior to invasive dental procedures as not indicated.  The patient currently denies acute toothache, swellings, or abscesses. Patient was last seen by her daughter's dentist for an exam and cleaning approximately one year ago. Patient is usually seen on a twice a year basis. The patient denies having any partial dentures at this time.   DENTAL EXAMINATION: GENERAL: The patient is a well-developed, slightly built female in no acute distress. The patient walks with the  aid of a walker. HEAD AND NECK: There is no palpable lymphadenopathy. The patient denies acute TMJ symptoms but has bilateral TMJ crepitus. INTRAORAL EXAM: The patient has incipient xerostomia. There is no evidence of abscess formation. DENTITION: The patient is missing tooth numbers 1, 2, 3, 4, 14, 15, 16, 17, 18, 19, 20, 21, 29, 30, 31, and 32. Multiple rotated teeth are noted. PERIODONTAL:  The patient has chronic periodontitis with plaque and calculus accumulations, generalized gingival recession, and incipient tooth mobility. Tooth numbers 23 and 24 are splinted together by resin. DENTAL CARIES/SUBOPTIMAL RESTORATIONS: There appears to be recurrent caries on the resin of tooth #7. This will be repaired at future visit. Multiple flexure lesions are noted. ENDODONTIC: The patient currently denies acute pulpitis symptoms. I do not see any evidence of periapical pathology. CROWN AND BRIDGE: There are no crown or bridge restorations. PROSTHODONTIC: Patient currently has no partial dentures that she is wearing. OCCLUSION: Patient has a poor occlusal scheme secondary to multiple missing teeth, multiple rotated teeth, supra-eruption and drifting of the unopposed teeth into the edentulous areas, and lack of replacement of all missing teeth with dental prostheses.  RADIOGRAPHIC INTERPRETATION: An orthopantogram was taken today and supplemented with 8 periapical radiographs. There are multiple missing teeth. There is moderate bone loss noted. There is no evidence of periapical pathology or radiolucency. There is a radiolucency to the maxilla and mandible consistent with osteoporosis. There appears to be recurrent caries associated with tooth #7. Multiple rotated teeth are noted.   ASSESSMENTS: 1. Chronic periodontitis with bone loss 2. Gingival recession 3. Incipient tooth mobility 4. Accretions 5. recurrent caries on tooth #7 6. Multiple flexure lesions 7. Multiple missing teeth 8. Supra-eruption  and drifting of the unopposed teeth into the edentulous areas 9. Multiple rotated teeth 10. No current upper or lower partial dentures 11. Poor occlusal scheme but stable malocclusion.  12. History of thrombocytopenia with risk for bleeding with invasive dental procedures 13. Status post total hip replacement with NO need for antibiotic premedication prior to invasive dental procedures per orthopedic surgeon-Dr. Marcene CorningPeter Dalldorf  PLAN/RECOMMENDATIONS: 1. I discussed the risks, benefits, and complications of various treatment options with the patient and her daughter in relationship to her medical and dental conditions. We discussed various treatment options to include no treatment, dental restoration #7, maintenance periodontal therapy, crown and bridge therapy, implant therapy, and replacement of missing teeth as indicated. The patient currently wishes to proceed with initial periodontal therapy-today, dental restorations tooth numbers 7, and then maintenance periodontal therapy every 6 months.  2. Discussion of findings with medical team and coordination of future medical and dental care as needed.  I spent 75 minutes face to face with patient and more than 50% of time was spent in counseling and /or coordination of care.  Procedure: Adult prophylaxis with Cavitron and multiple curettes as needed. Teeth polish. Fluoride varnish was applied. Postop instructions on brushing after meals and at bedtime, flossing at bedtime was provided today. Patient tolerated the procedure well. Patient was dismissed to the care of her daughter in stable condition. Patient is to return to clinic for dental resin restoration in the future.   Charlynne Pander, DDS

## 2014-04-09 ENCOUNTER — Encounter: Payer: Self-pay | Admitting: Internal Medicine

## 2014-04-09 ENCOUNTER — Ambulatory Visit (INDEPENDENT_AMBULATORY_CARE_PROVIDER_SITE_OTHER): Payer: PRIVATE HEALTH INSURANCE | Admitting: Internal Medicine

## 2014-04-09 VITALS — BP 160/62 | HR 46 | Temp 97.7°F | Wt 102.2 lb

## 2014-04-09 DIAGNOSIS — K5909 Other constipation: Secondary | ICD-10-CM

## 2014-04-09 DIAGNOSIS — M67919 Unspecified disorder of synovium and tendon, unspecified shoulder: Secondary | ICD-10-CM

## 2014-04-09 DIAGNOSIS — F3289 Other specified depressive episodes: Secondary | ICD-10-CM

## 2014-04-09 DIAGNOSIS — M719 Bursopathy, unspecified: Secondary | ICD-10-CM

## 2014-04-09 DIAGNOSIS — I509 Heart failure, unspecified: Secondary | ICD-10-CM

## 2014-04-09 DIAGNOSIS — F329 Major depressive disorder, single episode, unspecified: Secondary | ICD-10-CM

## 2014-04-09 DIAGNOSIS — G309 Alzheimer's disease, unspecified: Secondary | ICD-10-CM

## 2014-04-09 DIAGNOSIS — M4800 Spinal stenosis, site unspecified: Secondary | ICD-10-CM

## 2014-04-09 DIAGNOSIS — I10 Essential (primary) hypertension: Secondary | ICD-10-CM

## 2014-04-09 DIAGNOSIS — M7581 Other shoulder lesions, right shoulder: Secondary | ICD-10-CM

## 2014-04-09 DIAGNOSIS — R627 Adult failure to thrive: Secondary | ICD-10-CM

## 2014-04-09 DIAGNOSIS — F32A Depression, unspecified: Secondary | ICD-10-CM

## 2014-04-09 DIAGNOSIS — F0280 Dementia in other diseases classified elsewhere without behavioral disturbance: Secondary | ICD-10-CM

## 2014-04-09 DIAGNOSIS — K5900 Constipation, unspecified: Secondary | ICD-10-CM

## 2014-04-09 MED ORDER — HYDROCODONE-ACETAMINOPHEN 5-325 MG PO TABS: 1.0000 | ORAL_TABLET | Freq: Three times a day (TID) | ORAL | Status: DC

## 2014-04-09 MED ORDER — MIRTAZAPINE 7.5 MG PO TABS: 7.5000 mg | ORAL_TABLET | Freq: Every day | ORAL | Status: DC

## 2014-04-09 NOTE — Progress Notes (Signed)
Patient ID: [name], female   DOB: 02/07/1916, 80 y.o.   MRN: [id number]   Location:  [facility]  Code Status: DNR, does have living will and hcpoa  Allergies  Allergen Reactions  . Sulfa Antibiotics Shortness Of Breath    Worsens asthma  . Clonidine Derivatives   . Lactose Intolerance (Gi)   . Verapamil     [facility] MAR    Chief Complaint  Patient presents with  . Follow-up    3 month f/u  . Leg Swelling    leg swelling x 2 weeks  . other    per PT: can pain med be uped to 3 x daily due to pain and she has been having trouble sleeping    HPI: Patient is a 80 y.o. white female seen in the office today for med mgt chronic diseases and acute concern about increased pain in right shoulder, increased pain and swelling of legs. Also mood is bad today.  Note from facility said she had more swelling of her legs.  Already went twice here and went right before leaving.  Breaking out again, too.    Went to dentist last week.  Still doing word find.  Went to bingo twice.  Is upset today b/c friend was taken to hospital and pt's daughter was away for a week.  Wakes up at 3am after going to bed at 10pm.    Review of Systems:  Review of Systems  Constitutional: Positive for malaise/fatigue. Negative for fever.  HENT: Negative for congestion.   Eyes: Negative for pain.  Respiratory: Positive for shortness of breath.   Cardiovascular: Positive for leg swelling. Negative for chest pain.  Gastrointestinal: Positive for constipation. Negative for abdominal pain.  Genitourinary: Positive for urgency and frequency. Negative for dysuria.  Musculoskeletal: Positive for back pain and joint pain. Negative for falls and myalgias.  Skin: Negative for rash.  Neurological: Positive for weakness. Negative for dizziness and loss of consciousness.  Endo/Heme/Allergies: Bruises/bleeds easily.  Psychiatric/Behavioral: Positive for depression and memory loss.  The patient is nervous/anxious and has insomnia.     Past Medical History  Diagnosis Date  . COPD (chronic obstructive pulmonary disease)   . Hypertension   . Oral aphthae   . Orthostatic hypotension   . Senile osteoporosis   . Atrophy of vulva   . Unspecified urinary incontinence   . Urinary frequency   . Unspecified vitamin D deficiency   . Anxiety state, unspecified   . Restless legs syndrome (RLS)   . Unspecified disorder of kidney and ureter   . Atrioventricular block, unspecified   . Closed dislocation of shoulder, unspecified site   . Anemia, unspecified   . Altered mental status   . Lumbago   . Transient disorder of initiating or maintaining sleep   . Cellulitis and abscess of unspecified site   . Sebaceous cyst   . Insomnia, unspecified   . Dementia in conditions classified elsewhere without behavioral disturbance   . Depressive disorder, not elsewhere classified   . Abnormality of gait   . Adult failure to thrive   . Headache(784.0)   . Type II or unspecified type diabetes mellitus without mention of complication, not stated as uncontrolled   . Gout, unspecified   . Extrinsic asthma, unspecified   . Chronic kidney disease, stage I   . Osteoarthrosis, unspecified whether generalized or localized, unspecified site   . Spinal stenosis, unspecified region other than cervical   .  Edema   . Tachycardia, unspecified     Past Surgical History  Procedure Laterality Date  . Hip surgery      (R) hip replacement  . Cesarean section    . Abdominal hysterectomy      partial  . Back surgery      fusion  . Breast surgery      bilateral breast reduction  . Eye surgery      bilateral cataract  . Basal cell carcinoma excision    . Basal cell carcinoma excision      face  . Hip closed reduction Right 08/07/2013    Procedure: ATTEMPTED CLOSED MANIPULATION HIP;  Surgeon: Jodi Marble, MD;  Location: [facility] OR;  Service: Orthopedics;  Laterality: Right;  . Total hip  revision Right 08/08/2013    Procedure: TOTAL HIP REVISION- right;  Surgeon: Velna Ochs, MD;  Location: [facility] OR;  Service: Orthopedics;  Laterality: Right;    Social History:   reports that she has never smoked. She has never used smokeless tobacco. She reports that she does not drink alcohol or use illicit drugs.  Family History  Problem Relation Age of Onset  . Diabetes Mother   . Diabetes Father   . Stroke Sister   . Heart attack Brother     Medications: Patient's Medications  New Prescriptions   No medications on file  Previous Medications   ACETAMINOPHEN (TYLENOL) 325 MG TABLET    Take 325 mg by mouth. Every 8 hours as needed for pain   AMLODIPINE (NORVASC) 10 MG TABLET    Take 1 tablet (10 mg total) by mouth daily.   CLONAZEPAM (KLONOPIN) 1 MG TABLET    Take one tablet by mouth at bedtime for anxiety   FUROSEMIDE (LASIX) 20 MG TABLET    Take 20 mg by mouth every other day.   HYDROCODONE-ACETAMINOPHEN (NORCO/VICODIN) 5-325 MG PER TABLET    Take one tablet by mouth twice daily for pain   LISINOPRIL (PRINIVIL,ZESTRIL) 20 MG TABLET    Take 1 tablet (20 mg total) by mouth daily.   POLYETHYL GLYCOL-PROPYL GLYCOL (SYSTANE) 0.4-0.3 % SOLN    Instill one drop into each eye two times daily to relieve dry,itchy eye.   POLYVINYL ALCOHOL (LIQUIFILM TEARS) 1.4 % OPHTHALMIC SOLUTION    Place 1 drop into both eyes as needed for dry eyes.   POTASSIUM CHLORIDE SA (K-DUR,KLOR-CON) 20 MEQ TABLET    Take 20 mEq by mouth every other day.   SENNA-DOCUSATE (SENOKOT-S) 8.6-50 MG PER TABLET    Take 1 tablet by mouth daily.   TIZANIDINE (ZANAFLEX) 2 MG TABLET    Take 2 mg by mouth daily.    TRIAMCINOLONE CREAM (KENALOG) 0.5 %    Apply 1 application topically 2 (two) times daily. To rash on legs  Modified Medications   No medications on file  Discontinued Medications   No medications on file     Physical Exam: Filed Vitals:   04/09/14 1540  BP: 160/62  Pulse: 46  Temp: 97.7 F (36.5 C)    TempSrc: Oral  Weight: 102 lb 3.2 oz (46.358 kg)  SpO2: 98%  Physical Exam  Constitutional: She appears well-developed and well-nourished. No distress.  Cardiovascular: Normal rate, regular rhythm, normal heart sounds and intact distal pulses.   Pulmonary/Chest: Effort normal and breath sounds normal. No respiratory distress.  Abdominal: Soft. Bowel sounds are normal. She exhibits no distension and no mass. There is no tenderness.  Musculoskeletal: She exhibits tenderness.  Right shoulder  Neurological: She is alert.  Oriented to person and place, not time;  Poor short term memory notable during visit as forgets things she has already asked  Skin: Skin is warm and dry. There is erythema.  Erythematous patches on left leg greater than right;  Tenderness of upper calves which are edematous while ankles are thin   Labs reviewed: Basic Metabolic Panel:  Recent Labs  40/98/11 0602 08/12/13 0421  08/14/13 0625 09/05/13 0915 11/02/13 1112  NA 141 143  < > 143 142 143  K 4.0 3.9  < > 3.6 4.3 SPECIMEN/CONTAINER TYPE INAPPROPRIATE FOR ORDERED TEST, UNABLE TO PERFORM  CL 107 105  < > 101 101 96  CO2 26 27  < > 33* 24 27  GLUCOSE 109* 87  < > 104* 89 101*  BUN 16 14  < > 12 14 19   CREATININE 0.72 0.64  < > 0.65 0.71 0.67  CALCIUM 7.9* 8.8  < > 9.4 9.1 SPECIMEN/CONTAINER TYPE INAPPROPRIATE FOR ORDERED TEST, UNABLE TO PERFORM  MG  --  1.4*  --  1.9  --   --   < > = values in this interval not displayed. Liver Function Tests:  Recent Labs  11/02/13 1112  AST 20  ALT 15  ALKPHOS 12*  BILITOT 0.6  PROT 7.2  ALBUMIN 4.1  CBC:  Recent Labs  08/07/13 2149  08/11/13 0602 08/12/13 0421 11/02/13 1112  WBC 6.5  < > 7.6 7.4 8.0  NEUTROABS 4.6  --   --   --  5.8  HGB 11.6*  < > 9.9* 11.7* 14.8  HCT 34.8*  < > 30.6* 35.5* 42.9  MCV 89.2  < > 89.7 89.6 85.6  PLT 219  < > 197 256 131*  < > = values in this interval not displayed.  Assessment/Plan 1. HYPERTENSION -recheck 156/62,  ok -has bradycardia due to heart block -no changes made to avoid falling  2. Alzheimer's disease -is mild, but requires some assistance with meds  3. Right rotator cuff tendonitis -chronic pain in right shoulder -will increase hydrocodone to 3x daily for severe pain -stop zanaflex--does not seem to have any muscle spasms  4. Depression -will restart remeron7.5mg  due to worsened mood lately and difficulty sleeping well -remains on clonazepam for anxiety--has been on long term and she also had been on hospice for a period of time when it was started  5. Failure to thrive in adult -intake is not great, but this is not new, and may improve with restarting depression treatment  6. Chronic constipation -stable lately  7. Spinal stenosis, unspecified region other than cervical -lumbar, no longer has muscle spasms, but has chronic pain and will increase norco for better pain control  8. Acute congestive heart failure, unspecified congestive heart failure type -bilateral LE edema worse and has left base crackles so will increase lasix to 20mg  daily FOR ONE WEEK, then return to every other day  Labs/tests ordered: none today Next appt:  3 mos

## 2014-04-17 ENCOUNTER — Telehealth: Payer: Self-pay | Admitting: *Deleted

## 2014-04-17 NOTE — Telephone Encounter (Signed)
[facility] stating that Residents leg are still swollen, Finished 1 week of Lasix everyday. Please advise about Lasix back to every other day or continue everyday. Please advise with order. Dr. Glade LloydPandey would like for you to address.

## 2014-04-18 MED ORDER — POTASSIUM CHLORIDE CRYS ER 20 MEQ PO TBCR: EXTENDED_RELEASE_TABLET | ORAL | Status: DC

## 2014-04-18 NOTE — Telephone Encounter (Signed)
Printed order in Epic from Dr. Renato Gailseed and [facility]. LMOM for daughter to return call.

## 2014-04-18 NOTE — Telephone Encounter (Signed)
Let's continue lasix daily along with kcl also will need to be daily (doses as in system).   Please also inform her daughter that this is going on b/c she often knows more about what is happening with her mom's legs.

## 2014-04-20 ENCOUNTER — Other Ambulatory Visit: Payer: Self-pay | Admitting: *Deleted

## 2014-04-30 ENCOUNTER — Encounter: Payer: Self-pay | Admitting: Internal Medicine

## 2014-04-30 ENCOUNTER — Ambulatory Visit (INDEPENDENT_AMBULATORY_CARE_PROVIDER_SITE_OTHER): Payer: PRIVATE HEALTH INSURANCE | Admitting: Internal Medicine

## 2014-04-30 VITALS — BP 118/60 | HR 46 | Temp 97.5°F | Wt 98.2 lb

## 2014-04-30 DIAGNOSIS — I5032 Chronic diastolic (congestive) heart failure: Secondary | ICD-10-CM

## 2014-04-30 DIAGNOSIS — M67919 Unspecified disorder of synovium and tendon, unspecified shoulder: Secondary | ICD-10-CM

## 2014-04-30 DIAGNOSIS — L03116 Cellulitis of left lower limb: Secondary | ICD-10-CM

## 2014-04-30 DIAGNOSIS — L02419 Cutaneous abscess of limb, unspecified: Secondary | ICD-10-CM

## 2014-04-30 DIAGNOSIS — I509 Heart failure, unspecified: Secondary | ICD-10-CM

## 2014-04-30 DIAGNOSIS — L03119 Cellulitis of unspecified part of limb: Secondary | ICD-10-CM

## 2014-04-30 DIAGNOSIS — R42 Dizziness and giddiness: Secondary | ICD-10-CM

## 2014-04-30 DIAGNOSIS — M719 Bursopathy, unspecified: Secondary | ICD-10-CM

## 2014-04-30 DIAGNOSIS — B369 Superficial mycosis, unspecified: Secondary | ICD-10-CM

## 2014-04-30 DIAGNOSIS — M7581 Other shoulder lesions, right shoulder: Secondary | ICD-10-CM

## 2014-04-30 MED ORDER — TRIAMCINOLONE ACETONIDE 0.5 % EX CREA: 1.0000 "application " | TOPICAL_CREAM | Freq: Two times a day (BID) | CUTANEOUS | Status: DC

## 2014-04-30 MED ORDER — FUROSEMIDE 20 MG PO TABS: 20.0000 mg | ORAL_TABLET | Freq: Every day | ORAL | Status: DC

## 2014-04-30 MED ORDER — FUROSEMIDE 20 MG PO TABS: 20.0000 mg | ORAL_TABLET | ORAL | Status: DC

## 2014-04-30 MED ORDER — POTASSIUM CHLORIDE CRYS ER 20 MEQ PO TBCR: 20.0000 meq | EXTENDED_RELEASE_TABLET | ORAL | Status: DC

## 2014-04-30 MED ORDER — POTASSIUM CHLORIDE CRYS ER 20 MEQ PO TBCR: EXTENDED_RELEASE_TABLET | ORAL | Status: DC

## 2014-04-30 MED ORDER — TRIAMCINOLONE ACETONIDE 0.5 % EX CREA
1.0000 | TOPICAL_CREAM | Freq: Two times a day (BID) | CUTANEOUS | Status: DC
Start: 2014-04-30 — End: 2014-04-30

## 2014-04-30 NOTE — Progress Notes (Signed)
Patient ID: [name], female   DOB: Aug 22, 1916, 80 y.o.   MRN: [id number]   Location:  [facility]  Code Status: DNR  Allergies  Allergen Reactions  . Sulfa Antibiotics Shortness Of Breath    Worsens asthma  . Clonidine Derivatives   . Lactose Intolerance (Gi)   . Verapamil     [facility] MAR    Chief Complaint  Patient presents with  . Acute Visit    bi-lateral feet/leg pain, swelling with LT leg worse x 1 week   . other    fall screening neg.    HPI: Patient is a 80 y.o. white female seen in the office today for an acute visit due to bilateral foot and leg pain worse on the left--said to be worse for 1 week.  She has chronic LE edema with chronic venous stasis.  She agreed last visit to wear compression hose if the increased lasix was ineffective.  Had been wearing compression hose for 2 days.  Removed last night.  Legs were bleeding, she says.  Has been using the lasix with potassium daily.    Right shoulder remains painful--she cannot do therapy right now for it due to insurance purposes, she says.   Takes pill only every other day.    Review of Systems:  Review of Systems  Constitutional: Negative for fever.  HENT: Negative for congestion.   Eyes: Positive for blurred vision.       Wears glasses  Respiratory: Negative for shortness of breath.   Cardiovascular: Positive for leg swelling. Negative for chest pain.  Gastrointestinal: Negative for heartburn.  Genitourinary: Positive for urgency and frequency. Negative for dysuria.  Musculoskeletal: Positive for joint pain.  Skin: Positive for rash. Negative for itching.       Pain left leg  Neurological: Negative for dizziness, loss of consciousness and headaches.       Does get lightheaded when she stands up--feels off balance again for 5 days  Endo/Heme/Allergies: Bruises/bleeds easily.  Psychiatric/Behavioral: Positive for memory loss.       Mood better today     Past  Medical History  Diagnosis Date  . COPD (chronic obstructive pulmonary disease)   . Hypertension   . Oral aphthae   . Orthostatic hypotension   . Senile osteoporosis   . Atrophy of vulva   . Unspecified urinary incontinence   . Urinary frequency   . Unspecified vitamin D deficiency   . Anxiety state, unspecified   . Restless legs syndrome (RLS)   . Unspecified disorder of kidney and ureter   . Atrioventricular block, unspecified   . Closed dislocation of shoulder, unspecified site   . Anemia, unspecified   . Altered mental status   . Lumbago   . Transient disorder of initiating or maintaining sleep   . Cellulitis and abscess of unspecified site   . Sebaceous cyst   . Insomnia, unspecified   . Dementia in conditions classified elsewhere without behavioral disturbance   . Depressive disorder, not elsewhere classified   . Abnormality of gait   . Adult failure to thrive   . Headache(784.0)   . Type II or unspecified type diabetes mellitus without mention of complication, not stated as uncontrolled   . Gout, unspecified   . Extrinsic asthma, unspecified   . Chronic kidney disease, stage I   . Osteoarthrosis, unspecified whether generalized or localized, unspecified site   . Spinal stenosis, unspecified region other than cervical   .  Edema   . Tachycardia, unspecified     Past Surgical History  Procedure Laterality Date  . Hip surgery      (R) hip replacement  . Cesarean section    . Abdominal hysterectomy      partial  . Back surgery      fusion  . Breast surgery      bilateral breast reduction  . Eye surgery      bilateral cataract  . Basal cell carcinoma excision    . Basal cell carcinoma excision      face  . Hip closed reduction Right 08/07/2013    Procedure: ATTEMPTED CLOSED MANIPULATION HIP;  Surgeon: Jodi Marbleavid A Thompson, MD;  [facility];  Service: Orthopedics;  Laterality: Right;  . Total hip revision Right 08/08/2013    Procedure: TOTAL HIP REVISION-  right;  Surgeon: Velna OchsPeter G Dalldorf, MD;  Location: [facility] OR;  Service: Orthopedics;  Laterality: Right;    Social History:   reports that she has never smoked. She has never used smokeless tobacco. She reports that she does not drink alcohol or use illicit drugs.  Family History  Problem Relation Age of Onset  . Diabetes Mother   . Diabetes Father   . Stroke Sister   . Heart attack Brother     Medications: Patient's Medications  New Prescriptions   No medications on file  Previous Medications   ACETAMINOPHEN (TYLENOL) 325 MG TABLET    Take 325 mg by mouth. Every 8 hours as needed for pain   AMLODIPINE (NORVASC) 10 MG TABLET    Take 1 tablet (10 mg total) by mouth daily.   CLONAZEPAM (KLONOPIN) 1 MG TABLET    Take one tablet by mouth at bedtime for anxiety   FUROSEMIDE (LASIX) 20 MG TABLET    Take 20 mg by mouth every other day.   HYDROCODONE-ACETAMINOPHEN (NORCO/VICODIN) 5-325 MG PER TABLET    Take 1 tablet by mouth 3 (three) times daily. Take one tablet by mouth twice daily for pain   LISINOPRIL (PRINIVIL,ZESTRIL) 20 MG TABLET    Take 1 tablet (20 mg total) by mouth daily.   MIRTAZAPINE (REMERON) 7.5 MG TABLET    Take 1 tablet (7.5 mg total) by mouth at bedtime.   POLYETHYL GLYCOL-PROPYL GLYCOL (SYSTANE) 0.4-0.3 % SOLN    Instill one drop into each eye two times daily to relieve dry,itchy eye.   POLYVINYL ALCOHOL (LIQUIFILM TEARS) 1.4 % OPHTHALMIC SOLUTION    Place 1 drop into both eyes as needed for dry eyes.   POTASSIUM CHLORIDE SA (K-DUR,KLOR-CON) 20 MEQ TABLET    Take one tablet by mouth once daily   SENNA-DOCUSATE (SENOKOT-S) 8.6-50 MG PER TABLET    Take 1 tablet by mouth daily.   TRIAMCINOLONE CREAM (KENALOG) 0.5 %    Apply 1 application topically 2 (two) times daily. To rash on legs  Modified Medications   No medications on file  Discontinued Medications   No medications on file     Physical Exam: Filed Vitals:   04/30/14 0833  BP: 118/60  Pulse: 46  Temp: 97.5 F (36.4  C)  TempSrc: Oral  Weight: 98 lb 3.2 oz (44.543 kg)  SpO2: 95%  Physical Exam  Constitutional:  Frail white female, ambulates with rolling walker  Neck: No JVD present.  Cardiovascular: Regular rhythm, normal heart sounds and intact distal pulses.   brady  Pulmonary/Chest: Effort normal and breath sounds normal. No respiratory distress. She has no rales.  Abdominal: Soft.  Bowel sounds are normal. She exhibits no distension. There is no tenderness.  Neurological: She is alert.  Oriented to person and place  Skin:  LLE edema especially of left calf region, has ulcerated area anterolaterally with erythema from ankle to knee, RLE w/o significant erythema, only mild edema of calf area  Psychiatric: She has a normal mood and affect.    Labs reviewed: Basic Metabolic Panel:  Recent Labs  09/81/19 0602 08/12/13 0421  08/14/13 0625 09/05/13 0915 11/02/13 1112  NA 141 143  < > 143 142 143  K 4.0 3.9  < > 3.6 4.3 SPECIMEN/CONTAINER TYPE INAPPROPRIATE FOR ORDERED TEST, UNABLE TO PERFORM  CL 107 105  < > 101 101 96  CO2 26 27  < > 33* 24 27  GLUCOSE 109* 87  < > 104* 89 101*  BUN 16 14  < > 12 14 19   CREATININE 0.72 0.64  < > 0.65 0.71 0.67  CALCIUM 7.9* 8.8  < > 9.4 9.1 SPECIMEN/CONTAINER TYPE INAPPROPRIATE FOR ORDERED TEST, UNABLE TO PERFORM  MG  --  1.4*  --  1.9  --   --   < > = values in this interval not displayed. Liver Function Tests:  Recent Labs  11/02/13 1112  AST 20  ALT 15  ALKPHOS 12*  BILITOT 0.6  PROT 7.2  ALBUMIN 4.1   No results found for this basename: LIPASE, AMYLASE,  in the last 8760 hours No results found for this basename: AMMONIA,  in the last 8760 hours CBC:  Recent Labs  08/07/13 2149  08/11/13 0602 08/12/13 0421 11/02/13 1112  WBC 6.5  < > 7.6 7.4 8.0  NEUTROABS 4.6  --   --   --  5.8  HGB 11.6*  < > 9.9* 11.7* 14.8  HCT 34.8*  < > 30.6* 35.5* 42.9  MCV 89.2  < > 89.7 89.6 85.6  PLT 219  < > 197 256 131*  < > = values in this interval  not displayed. Lipid Panel: No results found for this basename: CHOL, HDL, LDLCALC, TRIG, CHOLHDL, LDLDIRECT,  in the last 8760 hours No results found for this basename: HGBA1C    Assessment/Plan 1. Fungal skin infection -will resume triamcinolone cream to bilateral legs bid   2. Cellulitis of leg, left -start doxycycline 100mg  po bid for 10 days, florastor 250mg  po bid for 14 days -elevate legs at rest -cont compression hose  3. Diastolic CHF, chronic -reduce lasix to 20mg  qod and kcl qod instead of daily due to lightheadedness and weight has come down again -cont to monitor edema and respiratory status  4. Right rotator cuff tendonitis -persistent pain, unable to get therapy right now due to insurance  -cont hydrocodone as needed--only thing she can tolerate beyond tylenol--may contribute to confusion and lightheadedness, but necessary for comfort at this point  5. Episodic lightheadedness -due to lasix I believe and relative dehydration causing orthostasis as symptoms only going on since I had to increase her lasix to daily (had been better with qod dosing)  Labs/tests ordered:  None today Next appt:  Keep as scheduled for october

## 2014-05-01 ENCOUNTER — Encounter (HOSPITAL_COMMUNITY): Payer: Self-pay | Admitting: Dentistry

## 2014-05-02 ENCOUNTER — Encounter (HOSPITAL_COMMUNITY): Payer: Self-pay | Admitting: Dentistry

## 2014-05-02 ENCOUNTER — Ambulatory Visit (HOSPITAL_COMMUNITY): Payer: Medicaid - Dental | Admitting: Dentistry

## 2014-05-02 ENCOUNTER — Telehealth: Payer: Self-pay | Admitting: *Deleted

## 2014-05-02 ENCOUNTER — Encounter (INDEPENDENT_AMBULATORY_CARE_PROVIDER_SITE_OTHER): Payer: Self-pay

## 2014-05-02 VITALS — BP 130/38 | HR 40 | Temp 97.6°F

## 2014-05-02 DIAGNOSIS — I10 Essential (primary) hypertension: Secondary | ICD-10-CM

## 2014-05-02 DIAGNOSIS — K029 Dental caries, unspecified: Secondary | ICD-10-CM

## 2014-05-02 NOTE — Progress Notes (Signed)
05/02/2014  Patient:            [name] Date of Birth:  02/07/1916 MRN:                [id number]  BP 130/38  Pulse 40  Temp(Src) 97.6 F (36.4 C) (Oral) Daughter (RN) is well aware of low heart being secondary to heart block.  [name] SonRita A Carchi is 80 year old female that presents for dental restoration of tooth #7. Patient denies having any acute dental problems at this time. Patient wishes to proceed with restoration of tooth #7 as discussed. Patient accepts the risks, benefits, and potential complications of performing this procedure.  Procedure: 18 mg of Xylocaine with 0.009 mg of epinephrine via infiltration. [id number] #7 MFL/Resin (Repair of previous MIDFL/Resin) AE, Primer, DBA, Tetric EVO Ceram Shade A 4.0             Contoured restoration. [place]. Occlusion is acceptable.  The patient tolerated the procedure well. The patient was dismissed to the care of her daughter in good condition.   Cindra Evesonald Kulinski, DDS

## 2014-05-02 NOTE — Telephone Encounter (Signed)
[facility]l [facility]Ann [facility] and left message regarding patient assessment. Called nurse back and left message for her to fax patient's assessment to us for Dr. Renato Gailseed.

## 2014-05-02 NOTE — Patient Instructions (Signed)
Return to clinic as scheduled for periodontal recall in 6 months. Call if problems arise before then. Dr. Kristin BruinsKulinski

## 2014-05-07 ENCOUNTER — Other Ambulatory Visit: Payer: Self-pay | Admitting: *Deleted

## 2014-05-07 MED ORDER — HYDROCODONE-ACETAMINOPHEN 5-325 MG PO TABS: ORAL_TABLET | ORAL | Status: DC

## 2014-05-07 NOTE — Telephone Encounter (Signed)
[facility]

## 2014-05-08 ENCOUNTER — Other Ambulatory Visit: Payer: Self-pay | Admitting: *Deleted

## 2014-05-08 MED ORDER — HYDROCODONE-ACETAMINOPHEN 5-325 MG PO TABS
ORAL_TABLET | ORAL | Status: DC
Start: 2014-05-08 — End: 2014-05-28

## 2014-05-08 NOTE — Telephone Encounter (Signed)
[facility] 

## 2014-05-15 DIAGNOSIS — M25519 Pain in unspecified shoulder: Secondary | ICD-10-CM

## 2014-05-15 DIAGNOSIS — IMO0002 Reserved for concepts with insufficient information to code with codable children: Secondary | ICD-10-CM

## 2014-05-15 DIAGNOSIS — S81009A Unspecified open wound, unspecified knee, initial encounter: Secondary | ICD-10-CM

## 2014-05-15 DIAGNOSIS — S91009A Unspecified open wound, unspecified ankle, initial encounter: Secondary | ICD-10-CM

## 2014-05-15 DIAGNOSIS — S81809A Unspecified open wound, unspecified lower leg, initial encounter: Secondary | ICD-10-CM

## 2014-05-15 DIAGNOSIS — M24519 Contracture, unspecified shoulder: Secondary | ICD-10-CM

## 2014-05-24 ENCOUNTER — Telehealth: Payer: Self-pay | Admitting: *Deleted

## 2014-05-24 NOTE — Telephone Encounter (Signed)
Joann with [facility] called and stated that she needed orders for patient to have wound care. Told her to fax the order over and Dr. Renato Gails will sign. She agreed and will fax.

## 2014-05-28 ENCOUNTER — Other Ambulatory Visit: Payer: Self-pay | Admitting: *Deleted

## 2014-05-28 MED ORDER — HYDROCODONE-ACETAMINOPHEN 5-325 MG PO TABS: ORAL_TABLET | ORAL | Status: DC

## 2014-05-28 NOTE — Telephone Encounter (Signed)
[facility] 

## 2014-06-18 ENCOUNTER — Telehealth: Payer: Self-pay | Admitting: *Deleted

## 2014-06-18 ENCOUNTER — Encounter: Payer: Self-pay | Admitting: *Deleted

## 2014-06-18 NOTE — Telephone Encounter (Signed)
[name] with [facility] called stating she needs a form filled out in order for patient to continue care there. She will fax form to my attention for Dr. Renato Gails to sign.

## 2014-06-19 DIAGNOSIS — Z23 Encounter for immunization: Secondary | ICD-10-CM | POA: Diagnosis not present

## 2014-06-27 ENCOUNTER — Other Ambulatory Visit: Payer: Self-pay | Admitting: *Deleted

## 2014-06-27 MED ORDER — HYDROCODONE-ACETAMINOPHEN 5-325 MG PO TABS: ORAL_TABLET | ORAL | Status: DC

## 2014-06-27 NOTE — Telephone Encounter (Signed)
[facility]

## 2014-07-05 ENCOUNTER — Other Ambulatory Visit: Payer: Self-pay | Admitting: *Deleted

## 2014-07-05 MED ORDER — HYDROCODONE-ACETAMINOPHEN 5-325 MG PO TABS: ORAL_TABLET | ORAL | Status: DC

## 2014-07-05 NOTE — Telephone Encounter (Signed)
[facility] 

## 2014-07-12 ENCOUNTER — Ambulatory Visit: Payer: PRIVATE HEALTH INSURANCE | Admitting: Internal Medicine

## 2014-07-14 DIAGNOSIS — M4806 Spinal stenosis, lumbar region: Secondary | ICD-10-CM

## 2014-07-14 DIAGNOSIS — G309 Alzheimer's disease, unspecified: Secondary | ICD-10-CM

## 2014-07-14 DIAGNOSIS — F0280 Dementia in other diseases classified elsewhere without behavioral disturbance: Secondary | ICD-10-CM

## 2014-07-14 DIAGNOSIS — M810 Age-related osteoporosis without current pathological fracture: Secondary | ICD-10-CM

## 2014-07-30 ENCOUNTER — Other Ambulatory Visit: Payer: Self-pay | Admitting: *Deleted

## 2014-07-30 MED ORDER — CAPSICUM OLEORESIN 0.025 % EX CREA: TOPICAL_CREAM | CUTANEOUS | Status: DC

## 2014-07-30 NOTE — Telephone Encounter (Signed)
[facility] [place]--switched from Voltaren gel to Trixaicin due to non covered medication

## 2014-07-31 ENCOUNTER — Ambulatory Visit: Payer: PRIVATE HEALTH INSURANCE | Admitting: Internal Medicine

## 2014-08-01 ENCOUNTER — Ambulatory Visit (INDEPENDENT_AMBULATORY_CARE_PROVIDER_SITE_OTHER): Payer: PRIVATE HEALTH INSURANCE | Admitting: Internal Medicine

## 2014-08-01 VITALS — BP 140/78 | HR 52 | Temp 97.6°F | Resp 10

## 2014-08-01 DIAGNOSIS — S81809A Unspecified open wound, unspecified lower leg, initial encounter: Secondary | ICD-10-CM | POA: Insufficient documentation

## 2014-08-01 DIAGNOSIS — I10 Essential (primary) hypertension: Secondary | ICD-10-CM

## 2014-08-01 DIAGNOSIS — S81801A Unspecified open wound, right lower leg, initial encounter: Secondary | ICD-10-CM

## 2014-08-01 MED ORDER — DOXYCYCLINE HYCLATE 100 MG PO CAPS: ORAL_CAPSULE | ORAL | Status: DC

## 2014-08-01 NOTE — Patient Instructions (Signed)
Inspect wound daily and replace gauze. Adjust ACE wrap as needed.

## 2014-08-02 ENCOUNTER — Encounter: Payer: Self-pay | Admitting: Internal Medicine

## 2014-08-02 NOTE — Progress Notes (Signed)
Patient ID: [name], female   DOB: Mar 04, 1916, 80 y.o.   MRN: [id number]    Facility  [facility]    Place of Service:   OFFICE   Allergies  Allergen Reactions  . Sulfa Antibiotics Shortness Of Breath    Worsens asthma  . Clonidine Derivatives   . Lactose Intolerance (Gi)   . Verapamil     NH MAR    Chief Complaint  Patient presents with  . Medical Management of Chronic Issues    3 month follow-up     HPI:  Car door injured the right leg when she was getting out of car today. Created skin tear and 3 inch flap with crescent shaped edge.  She is otherwise doing [place]. Prior cellulits of the right leg is resolved  BP controlled.  Remains frail and needs assistance with most ADL. Residing at [facility] facility: [name] on [place] (prior [facility]  Medications: Patient's Medications  New Prescriptions   DOXYCYCLINE (VIBRAMYCIN) 100 MG CAPSULE    One twice daly for infection  Previous Medications   ACETAMINOPHEN (TYLENOL) 325 MG TABLET    Take 325 mg by mouth. Every 8 hours as needed for pain   AMLODIPINE (NORVASC) 10 MG TABLET    Take 1 tablet (10 mg total) by mouth daily.   CAPSICUM OLEORESIN (TRIXAICIN) 0.025 % CREAM    Apply to right shoulder and upper arm three times daily   CLONAZEPAM (KLONOPIN) 1 MG TABLET    Take one tablet by mouth at bedtime for anxiety   FUROSEMIDE (LASIX) 20 MG TABLET    Take 1 tablet (20 mg total) by mouth every other day. For chf   HYDROCODONE-ACETAMINOPHEN (NORCO/VICODIN) 5-325 MG PER TABLET    Take one tablet by mouth twice daily for pain   LISINOPRIL (PRINIVIL,ZESTRIL) 20 MG TABLET    Take 1 tablet (20 mg total) by mouth daily.   MIRTAZAPINE (REMERON) 7.5 MG TABLET    Take 1 tablet (7.5 mg total) by mouth at bedtime.   POLYETHYL GLYCOL-PROPYL GLYCOL (SYSTANE) 0.4-0.3 % SOLN    Instill one drop into each eye two times daily to relieve dry,itchy eye.   POLYVINYL ALCOHOL (LIQUIFILM TEARS) 1.4 % OPHTHALMIC SOLUTION    Place 1 drop into both eyes  as needed for dry eyes.   POTASSIUM CHLORIDE SA (K-DUR,KLOR-CON) 20 MEQ TABLET    Take 1 tablet (20 mEq total) by mouth every other day. For chf   SENNA-DOCUSATE (SENOKOT-S) 8.6-50 MG PER TABLET    Take 1 tablet by mouth daily.   TRIAMCINOLONE CREAM (KENALOG) 0.5 %    Apply 1 application topically 2 (two) times daily. To rash on legs  Modified Medications   No medications on file  Discontinued Medications   DOXYCYCLINE (VIBRAMYCIN) 100 MG CAPSULE         Review of Systems  Constitutional: Positive for activity change and fatigue. Negative for fever, chills, diaphoresis, appetite change and unexpected weight change.  HENT: Positive for hearing loss. Negative for congestion, ear discharge, ear pain, postnasal drip, rhinorrhea, sore throat, tinnitus, trouble swallowing and voice change.   Eyes: Positive for visual disturbance (corrective lenses). Negative for pain, redness and itching.  Respiratory: Negative for cough, choking, shortness of breath and wheezing.   Cardiovascular: Positive for leg swelling. Negative for chest pain and palpitations.  Gastrointestinal: Negative for nausea, abdominal pain, diarrhea, constipation and abdominal distention.  Endocrine: Negative for cold intolerance, heat intolerance, polydipsia, polyphagia and polyuria.  Genitourinary: Negative for  dysuria, urgency, frequency, hematuria, flank pain, vaginal discharge, difficulty urinating and pelvic pain.  Musculoskeletal: Positive for back pain and gait problem. Negative for myalgias, arthralgias, neck pain and neck stiffness.  Skin: Positive for wound (skin tear of the right lower leg medially). Negative for color change, pallor and rash.  Allergic/Immunologic: Negative.   Neurological: Positive for weakness. Negative for dizziness, tremors, seizures, syncope, numbness and headaches.  Hematological: Negative for adenopathy. Does not bruise/bleed easily.  Psychiatric/Behavioral: Negative for suicidal ideas,  hallucinations, behavioral problems, confusion, sleep disturbance, dysphoric mood and agitation. The patient is not nervous/anxious and is not hyperactive.     Filed Vitals:   08/01/14 1550  BP: 140/78  Pulse: 52  Temp: 97.6 F (36.4 C)  TempSrc: Oral  Resp: 10   There is no weight on file to calculate BMI.  Physical Exam  Constitutional:  Frail white female, ambulates with rolling walker  HENT:  Loss of hearing  Eyes: Conjunctivae and EOM are normal. Pupils are equal, round, and reactive to light.  Neck: No JVD present. No tracheal deviation present. No thyromegaly present.  Cardiovascular: Regular rhythm, normal heart sounds and intact distal pulses.   bradycardia  Pulmonary/Chest: Effort normal and breath sounds normal. No respiratory distress. She has no rales.  Abdominal: Soft. Bowel sounds are normal. She exhibits no distension. There is no tenderness.  Musculoskeletal: She exhibits edema and tenderness.  Lymphadenopathy:    She has no cervical adenopathy.  Neurological: She is alert. No cranial nerve deficit. Coordination abnormal.  Oriented to person and place  Skin:  LLE edema especially of left calf region. RLE w/o significant erythema, only mild edema of calf area. Fresh skin tear right medial calf about 3" long.  Psychiatric: She has a normal mood and affect.     Labs reviewed: No visits with results within 3 Month(s) from this visit. Latest known visit with results is:  Admission on 11/02/2013, Discharged on 11/02/2013  Component Date Value Ref Range Status  . WBC 11/02/2013 8.0  4.0 - 10.5 K/uL Final   Comment: SPECIMEN/CONTAINER TYPE INAPPROPRIATE FOR ORDERED TEST, UNABLE TO PERFORM                          NOTIFIED [place] GROSE,RN 1230 11/02/13 BY CLARK,S                          QUESTIONABLE RESULTS, RECOMMEND RECOLLECT TO VERIFY  . RBC 11/02/2013 5.01  3.87 - 5.11 MIL/uL Final   Comment: SPECIMEN/CONTAINER TYPE INAPPROPRIATE FOR ORDERED TEST, UNABLE TO  PERFORM                          QUESTIONABLE RESULTS, RECOMMEND RECOLLECT TO VERIFY  . Hemoglobin 11/02/2013 14.8  12.0 - 15.0 g/dL Final   Comment: SPECIMEN/CONTAINER TYPE INAPPROPRIATE FOR ORDERED TEST, UNABLE TO PERFORM                          QUESTIONABLE RESULTS, RECOMMEND RECOLLECT TO VERIFY  . HCT 11/02/2013 42.9  36.0 - 46.0 % Final   Comment: SPECIMEN/CONTAINER TYPE INAPPROPRIATE FOR ORDERED TEST, UNABLE TO PERFORM                          QUESTIONABLE RESULTS, RECOMMEND RECOLLECT TO VERIFY  . MCV 11/02/2013 85.6  78.0 - 100.0 fL  Final   Comment: SPECIMEN/CONTAINER TYPE INAPPROPRIATE FOR ORDERED TEST, UNABLE TO PERFORM                          QUESTIONABLE RESULTS, RECOMMEND RECOLLECT TO VERIFY  . MCH 11/02/2013 29.5  26.0 - 34.0 pg Final   Comment: SPECIMEN/CONTAINER TYPE INAPPROPRIATE FOR ORDERED TEST, UNABLE TO PERFORM                          QUESTIONABLE RESULTS, RECOMMEND RECOLLECT TO VERIFY  . MCHC 11/02/2013 34.5  30.0 - 36.0 g/dL Final   Comment: SPECIMEN/CONTAINER TYPE INAPPROPRIATE FOR ORDERED TEST, UNABLE TO PERFORM                          QUESTIONABLE RESULTS, RECOMMEND RECOLLECT TO VERIFY  . RDW 11/02/2013 13.0  11.5 - 15.5 % Final   Comment: SPECIMEN/CONTAINER TYPE INAPPROPRIATE FOR ORDERED TEST, UNABLE TO PERFORM                          QUESTIONABLE RESULTS, RECOMMEND RECOLLECT TO VERIFY  . Platelets 11/02/2013 131* 150 - 400 K/uL Final   Comment: SPECIMEN/CONTAINER TYPE INAPPROPRIATE FOR ORDERED TEST, UNABLE TO PERFORM                          QUESTIONABLE RESULTS, RECOMMEND RECOLLECT TO VERIFY  . Neutrophils Relative % 11/02/2013 72  43 - 77 % Final   Comment: SPECIMEN/CONTAINER TYPE INAPPROPRIATE FOR ORDERED TEST, UNABLE TO PERFORM                          QUESTIONABLE RESULTS, RECOMMEND RECOLLECT TO VERIFY  . Neutro Abs 11/02/2013 5.8  1.7 - 7.7 K/uL Final   Comment: SPECIMEN/CONTAINER TYPE INAPPROPRIATE FOR ORDERED TEST, UNABLE TO PERFORM                           QUESTIONABLE RESULTS, RECOMMEND RECOLLECT TO VERIFY  . Lymphocytes Relative 11/02/2013 22  12 - 46 % Final   Comment: SPECIMEN/CONTAINER TYPE INAPPROPRIATE FOR ORDERED TEST, UNABLE TO PERFORM                          QUESTIONABLE RESULTS, RECOMMEND RECOLLECT TO VERIFY  . Lymphs Abs 11/02/2013 1.8  0.7 - 4.0 K/uL Final   Comment: SPECIMEN/CONTAINER TYPE INAPPROPRIATE FOR ORDERED TEST, UNABLE TO PERFORM                          QUESTIONABLE RESULTS, RECOMMEND RECOLLECT TO VERIFY  . Monocytes Relative 11/02/2013 4  3 - 12 % Final   Comment: SPECIMEN/CONTAINER TYPE INAPPROPRIATE FOR ORDERED TEST, UNABLE TO PERFORM                          QUESTIONABLE RESULTS, RECOMMEND RECOLLECT TO VERIFY  . Monocytes Absolute 11/02/2013 0.4  0.1 - 1.0 K/uL Final   Comment: SPECIMEN/CONTAINER TYPE INAPPROPRIATE FOR ORDERED TEST, UNABLE TO PERFORM                          QUESTIONABLE RESULTS, RECOMMEND RECOLLECT TO VERIFY  . Eosinophils Relative 11/02/2013 1  0 -  5 % Final   Comment: SPECIMEN/CONTAINER TYPE INAPPROPRIATE FOR ORDERED TEST, UNABLE TO PERFORM                          QUESTIONABLE RESULTS, RECOMMEND RECOLLECT TO VERIFY  . Eosinophils Absolute 11/02/2013 0.1  0.0 - 0.7 K/uL Final   Comment: SPECIMEN/CONTAINER TYPE INAPPROPRIATE FOR ORDERED TEST, UNABLE TO PERFORM                          QUESTIONABLE RESULTS, RECOMMEND RECOLLECT TO VERIFY  . Basophils Relative 11/02/2013 0  0 - 1 % Final   Comment: SPECIMEN/CONTAINER TYPE INAPPROPRIATE FOR ORDERED TEST, UNABLE TO PERFORM                          QUESTIONABLE RESULTS, RECOMMEND RECOLLECT TO VERIFY  . Basophils Absolute 11/02/2013 0.0  0.0 - 0.1 K/uL Final   Comment: SPECIMEN/CONTAINER TYPE INAPPROPRIATE FOR ORDERED TEST, UNABLE TO PERFORM                          QUESTIONABLE RESULTS, RECOMMEND RECOLLECT TO VERIFY  . Sodium 11/02/2013 143  137 - 147 mEq/L Final   Comment: SPECIMEN/CONTAINER TYPE INAPPROPRIATE FOR ORDERED TEST, UNABLE TO  PERFORM                          QUESTIONABLE RESULTS, RECOMMEND RECOLLECT TO VERIFY  . Potassium 11/02/2013 SPECIMEN/CONTAINER TYPE INAPPROPRIATE FOR ORDERED TEST, UNABLE TO PERFORM  3.7 - 5.3 mEq/L Final  . Chloride 11/02/2013 96  96 - 112 mEq/L Final   Comment: SPECIMEN/CONTAINER TYPE INAPPROPRIATE FOR ORDERED TEST, UNABLE TO PERFORM                          QUESTIONABLE RESULTS, RECOMMEND RECOLLECT TO VERIFY  . CO2 11/02/2013 27  19 - 32 mEq/L Final   Comment: SPECIMEN/CONTAINER TYPE INAPPROPRIATE FOR ORDERED TEST, UNABLE TO PERFORM                          QUESTIONABLE RESULTS, RECOMMEND RECOLLECT TO VERIFY  . Glucose, Bld 11/02/2013 101* 70 - 99 mg/dL Final   Comment: SPECIMEN/CONTAINER TYPE INAPPROPRIATE FOR ORDERED TEST, UNABLE TO PERFORM                          QUESTIONABLE RESULTS, RECOMMEND RECOLLECT TO VERIFY  . BUN 11/02/2013 19  6 - 23 mg/dL Final   Comment: SPECIMEN/CONTAINER TYPE INAPPROPRIATE FOR ORDERED TEST, UNABLE TO PERFORM                          QUESTIONABLE RESULTS, RECOMMEND RECOLLECT TO VERIFY  . Creatinine, Ser 11/02/2013 0.67  0.50 - 1.10 mg/dL Final   Comment: SPECIMEN/CONTAINER TYPE INAPPROPRIATE FOR ORDERED TEST, UNABLE TO PERFORM                          QUESTIONABLE RESULTS, RECOMMEND RECOLLECT TO VERIFY  . Calcium 11/02/2013 SPECIMEN/CONTAINER TYPE INAPPROPRIATE FOR ORDERED TEST, UNABLE TO PERFORM  8.4 - 10.5 mg/dL Final   NOTIFIED [place] GROSE,RN 1230 11/02/13 CLARK,S  . Total Protein 11/02/2013 7.2  6.0 - 8.3 g/dL Final   Comment: SPECIMEN/CONTAINER TYPE  INAPPROPRIATE FOR ORDERED TEST, UNABLE TO PERFORM                          QUESTIONABLE RESULTS, RECOMMEND RECOLLECT TO VERIFY  . Albumin 11/02/2013 4.1  3.5 - 5.2 g/dL Final   Comment: SPECIMEN/CONTAINER TYPE INAPPROPRIATE FOR ORDERED TEST, UNABLE TO PERFORM                          QUESTIONABLE RESULTS, RECOMMEND RECOLLECT TO VERIFY  . AST 11/02/2013 20  0 - 37 U/L Final   Comment: SPECIMEN/CONTAINER  TYPE INAPPROPRIATE FOR ORDERED TEST, UNABLE TO PERFORM                          QUESTIONABLE RESULTS, RECOMMEND RECOLLECT TO VERIFY  . ALT 11/02/2013 15  0 - 35 U/L Final   Comment: SPECIMEN/CONTAINER TYPE INAPPROPRIATE FOR ORDERED TEST, UNABLE TO PERFORM                          QUESTIONABLE RESULTS, RECOMMEND RECOLLECT TO VERIFY  . Alkaline Phosphatase 11/02/2013 12* 39 - 117 U/L Final   Comment: SPECIMEN/CONTAINER TYPE INAPPROPRIATE FOR ORDERED TEST, UNABLE TO PERFORM                          QUESTIONABLE RESULTS, RECOMMEND RECOLLECT TO VERIFY  . Total Bilirubin 11/02/2013 0.6  0.3 - 1.2 mg/dL Final   Comment: SPECIMEN/CONTAINER TYPE INAPPROPRIATE FOR ORDERED TEST, UNABLE TO PERFORM                          QUESTIONABLE RESULTS, RECOMMEND RECOLLECT TO VERIFY  . GFR calc non Af Amer 11/02/2013 72* >90 mL/min Final   Comment: SPECIMEN/CONTAINER TYPE INAPPROPRIATE FOR ORDERED TEST, UNABLE TO PERFORM                          QUESTIONABLE RESULTS, RECOMMEND RECOLLECT TO VERIFY  . GFR calc Af Amer 11/02/2013 84* >90 mL/min Final   Comment: SPECIMEN/CONTAINER TYPE INAPPROPRIATE FOR ORDERED TEST, UNABLE TO PERFORM                          QUESTIONABLE RESULTS, RECOMMEND RECOLLECT TO VERIFY                          (NOTE)                          The eGFR has been calculated using the CKD EPI equation.                          This calculation has not been validated in all clinical situations.                          eGFR's persistently <90 mL/min signify possible Chronic Kidney                          Disease.  . Color, Urine 11/02/2013 YELLOW  YELLOW Final  . APPearance 11/02/2013 CLEAR  CLEAR Final  . Specific Gravity, Urine 11/02/2013 1.008  1.005 -  1.030 Final  . pH 11/02/2013 7.5  5.0 - 8.0 Final  . Glucose, UA 11/02/2013 NEGATIVE  NEGATIVE mg/dL Final  . Hgb urine dipstick 11/02/2013 NEGATIVE  NEGATIVE Final  . Bilirubin Urine 11/02/2013 NEGATIVE  NEGATIVE Final  . Ketones, ur  11/02/2013 NEGATIVE  NEGATIVE mg/dL Final  . Protein, ur 11/02/2013 100* NEGATIVE mg/dL Final  . Urobilinogen, UA 11/02/2013 0.2  0.0 - 1.0 mg/dL Final  . Nitrite 11/02/2013 NEGATIVE  NEGATIVE Final  . Leukocytes, UA 11/02/2013 NEGATIVE  NEGATIVE Final  . Squamous Epithelial / LPF 11/02/2013 FEW* RARE Final  . WBC, UA 11/02/2013 3-6  <3 WBC/hpf Final  . RBC / HPF 11/02/2013 0-2  <3 RBC/hpf Final  . Bacteria, UA 11/02/2013 MANY* RARE Final  . Specimen Description 11/02/2013 URINE, RANDOM   Final  . Special Requests 11/02/2013 NONE   Final  . Culture  Setup Time 11/02/2013    Final                   Value:11/02/2013 13:56                         Performed at [facility]  . Colony Count 11/02/2013    Final                   Value:>=100,000 COLONIES/ML                         Performed at [facility]  . Culture 11/02/2013    Final                   Value:ESCHERICHIA COLI                         Note: Confirmed Extended Spectrum Beta-Lactamase Producer (ESBL)                         Performed at [facility]  . Report Status 11/02/2013 11/04/2013 FINAL   Final  . Organism ID, Bacteria 11/02/2013 ESCHERICHIA COLI   Final     Assessment/Plan 1. Wound, open, leg, right, initial encounter Cleansed with Betadine Steri-strips applied Gauze pads and ACE wrap  Will need daily inspection, daily change of gauze pads and wrap with ACE bandage. Adjust ACE wrap as needed during the day to maintain proper tension on the leg.  Patient and daughter were advised that the chronic edema will slow the healing rate for this wound.  Due to hx of recurrent cellulitis, I thought it reasonable to cover with Doxycycline to try to ward off infection.  Return here for recheck in 1 week.  2. Essential hypertension Controlled.

## 2014-08-06 ENCOUNTER — Telehealth: Payer: Self-pay | Admitting: *Deleted

## 2014-08-06 NOTE — Telephone Encounter (Signed)
[name] SheldonAshley with [facility] called and stated that they needed patient's last Hgb A1C. Reviewed patient's chart and I don't see where one has been drawn. [name] Sheldonshley stated that they will draw one at patient's next home visit. Agreed.

## 2014-08-16 ENCOUNTER — Encounter: Payer: Self-pay | Admitting: Nurse Practitioner

## 2014-08-16 ENCOUNTER — Ambulatory Visit (INDEPENDENT_AMBULATORY_CARE_PROVIDER_SITE_OTHER): Payer: PRIVATE HEALTH INSURANCE | Admitting: Nurse Practitioner

## 2014-08-16 VITALS — BP 140/60 | HR 42 | Temp 97.8°F | Ht <= 58 in | Wt 112.4 lb

## 2014-08-16 DIAGNOSIS — S81812D Laceration without foreign body, left lower leg, subsequent encounter: Secondary | ICD-10-CM

## 2014-08-16 NOTE — Progress Notes (Signed)
Patient ID: [name], female   DOB: 02/07/1916, 80 y.o.   MRN: [id number]    PCP: Bufford SpikesEED, TIFFANY, DO  Allergies  Allergen Reactions  . Sulfa Antibiotics Shortness Of Breath    Worsens asthma  . Clonidine Derivatives   . Lactose Intolerance (Gi)   . Verapamil     NH MAR    Chief Complaint  Patient presents with  . Follow-up    Recheck (R) calf     HPI: Patient is a 80 y.o. female seen in the office today to follow up on wound. Last week when she came for routine follow up hit her leg when she was getting out of car and obtain skin tear. Was treated with doxycycline and had wound care come once to apply dressing.  No fevers note, redness improved, no drainage  Review of Systems:  Review of Systems  Constitutional: Positive for activity change and fatigue. Negative for fever, chills, diaphoresis, appetite change and unexpected weight change.  HENT: Positive for hearing loss. Negative for congestion, ear discharge and ear pain.   Eyes: Visual disturbance: corrective lenses.  Respiratory: Negative for cough, choking and shortness of breath.   Cardiovascular: Positive for leg swelling (chronic and stable). Negative for chest pain and palpitations.  Gastrointestinal: Negative for nausea, abdominal pain, diarrhea, constipation and abdominal distention.  Musculoskeletal: Positive for back pain and gait problem. Negative for myalgias, arthralgias, neck pain and neck stiffness.  Skin: Positive for wound (skin tear of the right lower leg medially). Negative for color change, pallor and rash.  Neurological: Positive for weakness. Negative for dizziness, tremors, seizures, syncope, numbness and headaches.  Hematological: Negative for adenopathy. Does not bruise/bleed easily.  Psychiatric/Behavioral: Negative for behavioral problems, confusion and agitation.    Past Medical History  Diagnosis Date  . COPD (chronic obstructive pulmonary disease)   . Hypertension   . Oral aphthae   .  Orthostatic hypotension   . Senile osteoporosis   . Atrophy of vulva   . Unspecified urinary incontinence   . Urinary frequency   . Unspecified vitamin D deficiency   . Anxiety state, unspecified   . Restless legs syndrome (RLS)   . Unspecified disorder of kidney and ureter   . Atrioventricular block, unspecified   . Closed dislocation of shoulder, unspecified site   . Anemia, unspecified   . Altered mental status   . Lumbago   . Transient disorder of initiating or maintaining sleep   . Cellulitis and abscess of unspecified site   . Sebaceous cyst   . Insomnia, unspecified   . Dementia in conditions classified elsewhere without behavioral disturbance   . Depressive disorder, not elsewhere classified   . Abnormality of gait   . Adult failure to thrive   . Headache(784.0)   . Type II or unspecified type diabetes mellitus without mention of complication, not stated as uncontrolled   . Gout, unspecified   . Extrinsic asthma, unspecified   . Chronic kidney disease, stage I   . Osteoarthrosis, unspecified whether generalized or localized, unspecified site   . Spinal stenosis, unspecified region other than cervical   . Edema   . Tachycardia, unspecified    Past Surgical History  Procedure Laterality Date  . Hip surgery      (R) hip replacement  . Cesarean section    . Abdominal hysterectomy      partial  . Back surgery      fusion  . Breast surgery  bilateral breast reduction  . Eye surgery      bilateral cataract  . Basal cell carcinoma excision    . Basal cell carcinoma excision      face  . Hip closed reduction Right 08/07/2013    Procedure: ATTEMPTED CLOSED MANIPULATION HIP;  Surgeon: Jodi Marbleavid A Thompson, MD;  [facility];  Service: Orthopedics;  Laterality: Right;  . Total hip revision Right 08/08/2013    Procedure: TOTAL HIP REVISION- right;  Surgeon: Velna OchsPeter G Dalldorf, MD;  Location: [facility] OR;  Service: Orthopedics;  Laterality: Right;   Social History:    reports that she has never smoked. She has never used smokeless tobacco. She reports that she does not drink alcohol or use illicit drugs.  Family History  Problem Relation Age of Onset  . Diabetes Mother   . Diabetes Father   . Stroke Sister   . Heart attack Brother     Medications: Patient's Medications  New Prescriptions   No medications on file  Previous Medications   ACETAMINOPHEN (TYLENOL) 325 MG TABLET    Take 325 mg by mouth. Every 8 hours as needed for pain   AMLODIPINE (NORVASC) 10 MG TABLET    Take 1 tablet (10 mg total) by mouth daily.   CAPSICUM OLEORESIN (TRIXAICIN) 0.025 % CREAM    Apply to right shoulder and upper arm three times daily   CLONAZEPAM (KLONOPIN) 1 MG TABLET    Take one tablet by mouth at bedtime for anxiety   FUROSEMIDE (LASIX) 20 MG TABLET    Take 1 tablet (20 mg total) by mouth every other day. For chf   HYDROCODONE-ACETAMINOPHEN (NORCO/VICODIN) 5-325 MG PER TABLET    Take one tablet by mouth twice daily for pain   LISINOPRIL (PRINIVIL,ZESTRIL) 20 MG TABLET    Take 1 tablet (20 mg total) by mouth daily.   MIRTAZAPINE (REMERON) 7.5 MG TABLET    Take 1 tablet (7.5 mg total) by mouth at bedtime.   POLYETHYL GLYCOL-PROPYL GLYCOL (SYSTANE) 0.4-0.3 % SOLN    Instill one drop into each eye two times daily to relieve dry,itchy eye.   POTASSIUM CHLORIDE SA (K-DUR,KLOR-CON) 20 MEQ TABLET    Take 1 tablet (20 mEq total) by mouth every other day. For chf   SENNA-DOCUSATE (SENOKOT-S) 8.6-50 MG PER TABLET    Take 1 tablet by mouth daily.   TRIAMCINOLONE CREAM (KENALOG) 0.5 %    Apply 1 application topically 2 (two) times daily. To rash on legs  Modified Medications   No medications on file  Discontinued Medications   DOXYCYCLINE (VIBRAMYCIN) 100 MG CAPSULE    One twice daly for infection   POLYVINYL ALCOHOL (LIQUIFILM TEARS) 1.4 % OPHTHALMIC SOLUTION    Place 1 drop into both eyes as needed for dry eyes.     Physical Exam:  Filed Vitals:   08/16/14 1520  BP:  140/60  Pulse: 42  Temp: 97.8 F (36.6 C)  TempSrc: Oral  Height: 4\' 8"  (1.422 m)  Weight: 112 lb 6.4 oz (50.984 kg)  SpO2: 98%    Physical Exam  Constitutional:  Frail white female, ambulates with rolling walker  HENT:  Loss of hearing  Eyes: Conjunctivae and EOM are normal. Pupils are equal, round, and reactive to light.  Neck: No JVD present. No tracheal deviation present. No thyromegaly present.  Cardiovascular: Regular rhythm, normal heart sounds and intact distal pulses.   bradycardia  Pulmonary/Chest: Effort normal and breath sounds normal. No respiratory distress. She has no  rales.  Abdominal: Soft. Bowel sounds are normal. She exhibits no distension. There is no tenderness.  Musculoskeletal: She exhibits edema and tenderness.  Lymphadenopathy:    She has no cervical adenopathy.  Neurological: She is alert. No cranial nerve deficit. Coordination abnormal.  Oriented to person and place  Skin:  LLE edema. RLE w/o significant erythema, only mild edema of calf area. skin tear right medial calf about 3" long that has healed.  Psychiatric: She has a normal mood and affect.    Labs reviewed: Basic Metabolic Panel:  Recent Labs  40/98/11 0915 11/02/13 1112  NA 142 143  K 4.3 SPECIMEN/CONTAINER TYPE INAPPROPRIATE FOR ORDERED TEST, UNABLE TO PERFORM  CL 101 96  CO2 24 27  GLUCOSE 89 101*  BUN 14 19  CREATININE 0.71 0.67  CALCIUM 9.1 SPECIMEN/CONTAINER TYPE INAPPROPRIATE FOR ORDERED TEST, UNABLE TO PERFORM   Liver Function Tests:  Recent Labs  11/02/13 1112  AST 20  ALT 15  ALKPHOS 12*  BILITOT 0.6  PROT 7.2  ALBUMIN 4.1   No results for input(s): LIPASE, AMYLASE in the last 8760 hours. No results for input(s): AMMONIA in the last 8760 hours. CBC:  Recent Labs  11/02/13 1112  WBC 8.0  NEUTROABS 5.8  HGB 14.8  HCT 42.9  MCV 85.6  PLT 131*   Lipid Panel: No results for input(s): CHOL, HDL, LDLCALC, TRIG, CHOLHDL, LDLDIRECT in the last 8760  hours. TSH: No results for input(s): TSH in the last 8760 hours. A1C: No results found for: HGBA1C   Assessment/Plan  Skin Tear Greatly improved with well approximated edges. Mild erythema. No signs of infection and wound is healing well.  Cont with telfa and ace to protect area. To change daily for 1 week Follow up as needed  And to make Routine follow up with Dr Renato Gails in 3 months

## 2014-08-16 NOTE — Patient Instructions (Signed)
To use Telfa dressing and wrap with ace daily for 1 week  Follow up as needed

## 2014-08-28 ENCOUNTER — Other Ambulatory Visit: Payer: Self-pay | Admitting: *Deleted

## 2014-08-28 MED ORDER — HYDROCODONE-ACETAMINOPHEN 5-325 MG PO TABS: ORAL_TABLET | ORAL | Status: DC

## 2014-08-28 NOTE — Telephone Encounter (Signed)
[facility]

## 2014-09-03 ENCOUNTER — Ambulatory Visit: Payer: Self-pay | Admitting: Internal Medicine

## 2014-09-03 DIAGNOSIS — Z0289 Encounter for other administrative examinations: Secondary | ICD-10-CM

## 2014-09-12 DIAGNOSIS — M24511 Contracture, right shoulder: Secondary | ICD-10-CM

## 2014-09-12 DIAGNOSIS — M4806 Spinal stenosis, lumbar region: Secondary | ICD-10-CM

## 2014-09-12 DIAGNOSIS — M25511 Pain in right shoulder: Secondary | ICD-10-CM

## 2014-09-12 DIAGNOSIS — S81011D Laceration without foreign body, right knee, subsequent encounter: Secondary | ICD-10-CM

## 2014-09-18 ENCOUNTER — Other Ambulatory Visit: Payer: Self-pay | Admitting: *Deleted

## 2014-09-18 MED ORDER — CLONAZEPAM 1 MG PO TABS: ORAL_TABLET | ORAL | Status: DC

## 2014-09-18 NOTE — Telephone Encounter (Signed)
[facility] Fax Order

## 2014-09-19 ENCOUNTER — Other Ambulatory Visit: Payer: Self-pay | Admitting: *Deleted

## 2014-09-19 MED ORDER — CLONAZEPAM 1 MG PO TABS: ORAL_TABLET | ORAL | Status: DC

## 2014-09-19 NOTE — Telephone Encounter (Signed)
[facility]

## 2014-10-01 ENCOUNTER — Other Ambulatory Visit: Payer: Self-pay | Admitting: *Deleted

## 2014-10-01 DIAGNOSIS — M6281 Muscle weakness (generalized): Secondary | ICD-10-CM | POA: Diagnosis not present

## 2014-10-01 DIAGNOSIS — M4806 Spinal stenosis, lumbar region: Secondary | ICD-10-CM | POA: Diagnosis not present

## 2014-10-01 DIAGNOSIS — Z9181 History of falling: Secondary | ICD-10-CM | POA: Diagnosis not present

## 2014-10-01 DIAGNOSIS — M25511 Pain in right shoulder: Secondary | ICD-10-CM | POA: Diagnosis not present

## 2014-10-01 DIAGNOSIS — G309 Alzheimer's disease, unspecified: Secondary | ICD-10-CM | POA: Diagnosis not present

## 2014-10-01 DIAGNOSIS — M24511 Contracture, right shoulder: Secondary | ICD-10-CM | POA: Diagnosis not present

## 2014-10-01 DIAGNOSIS — M810 Age-related osteoporosis without current pathological fracture: Secondary | ICD-10-CM | POA: Diagnosis not present

## 2014-10-01 DIAGNOSIS — F0280 Dementia in other diseases classified elsewhere without behavioral disturbance: Secondary | ICD-10-CM | POA: Diagnosis not present

## 2014-10-01 DIAGNOSIS — S81011D Laceration without foreign body, right knee, subsequent encounter: Secondary | ICD-10-CM | POA: Diagnosis not present

## 2014-10-01 DIAGNOSIS — F329 Major depressive disorder, single episode, unspecified: Secondary | ICD-10-CM | POA: Diagnosis not present

## 2014-10-01 DIAGNOSIS — M545 Low back pain: Secondary | ICD-10-CM | POA: Diagnosis not present

## 2014-10-01 DIAGNOSIS — M542 Cervicalgia: Secondary | ICD-10-CM | POA: Diagnosis not present

## 2014-10-01 DIAGNOSIS — E119 Type 2 diabetes mellitus without complications: Secondary | ICD-10-CM | POA: Diagnosis not present

## 2014-10-01 DIAGNOSIS — Z4800 Encounter for change or removal of nonsurgical wound dressing: Secondary | ICD-10-CM | POA: Diagnosis not present

## 2014-10-01 MED ORDER — HYDROCODONE-ACETAMINOPHEN 5-325 MG PO TABS: ORAL_TABLET | ORAL | Status: DC

## 2014-10-01 NOTE — Telephone Encounter (Signed)
Fax Order from [facility]   

## 2014-10-04 DIAGNOSIS — F329 Major depressive disorder, single episode, unspecified: Secondary | ICD-10-CM | POA: Diagnosis not present

## 2014-10-04 DIAGNOSIS — M4806 Spinal stenosis, lumbar region: Secondary | ICD-10-CM | POA: Diagnosis not present

## 2014-10-04 DIAGNOSIS — M545 Low back pain: Secondary | ICD-10-CM | POA: Diagnosis not present

## 2014-10-04 DIAGNOSIS — M6281 Muscle weakness (generalized): Secondary | ICD-10-CM | POA: Diagnosis not present

## 2014-10-04 DIAGNOSIS — M542 Cervicalgia: Secondary | ICD-10-CM | POA: Diagnosis not present

## 2014-10-04 DIAGNOSIS — M25511 Pain in right shoulder: Secondary | ICD-10-CM | POA: Diagnosis not present

## 2014-10-04 DIAGNOSIS — S81011D Laceration without foreign body, right knee, subsequent encounter: Secondary | ICD-10-CM | POA: Diagnosis not present

## 2014-10-04 DIAGNOSIS — Z4800 Encounter for change or removal of nonsurgical wound dressing: Secondary | ICD-10-CM | POA: Diagnosis not present

## 2014-10-04 DIAGNOSIS — Z9181 History of falling: Secondary | ICD-10-CM | POA: Diagnosis not present

## 2014-10-04 DIAGNOSIS — E119 Type 2 diabetes mellitus without complications: Secondary | ICD-10-CM | POA: Diagnosis not present

## 2014-10-04 DIAGNOSIS — M24511 Contracture, right shoulder: Secondary | ICD-10-CM | POA: Diagnosis not present

## 2014-10-04 DIAGNOSIS — G309 Alzheimer's disease, unspecified: Secondary | ICD-10-CM | POA: Diagnosis not present

## 2014-10-04 DIAGNOSIS — F0280 Dementia in other diseases classified elsewhere without behavioral disturbance: Secondary | ICD-10-CM | POA: Diagnosis not present

## 2014-10-04 DIAGNOSIS — M810 Age-related osteoporosis without current pathological fracture: Secondary | ICD-10-CM | POA: Diagnosis not present

## 2014-10-05 DIAGNOSIS — S81011D Laceration without foreign body, right knee, subsequent encounter: Secondary | ICD-10-CM | POA: Diagnosis not present

## 2014-10-05 DIAGNOSIS — F0280 Dementia in other diseases classified elsewhere without behavioral disturbance: Secondary | ICD-10-CM | POA: Diagnosis not present

## 2014-10-05 DIAGNOSIS — Z9181 History of falling: Secondary | ICD-10-CM | POA: Diagnosis not present

## 2014-10-05 DIAGNOSIS — M810 Age-related osteoporosis without current pathological fracture: Secondary | ICD-10-CM | POA: Diagnosis not present

## 2014-10-05 DIAGNOSIS — M545 Low back pain: Secondary | ICD-10-CM | POA: Diagnosis not present

## 2014-10-05 DIAGNOSIS — F329 Major depressive disorder, single episode, unspecified: Secondary | ICD-10-CM | POA: Diagnosis not present

## 2014-10-05 DIAGNOSIS — M24511 Contracture, right shoulder: Secondary | ICD-10-CM | POA: Diagnosis not present

## 2014-10-05 DIAGNOSIS — G309 Alzheimer's disease, unspecified: Secondary | ICD-10-CM | POA: Diagnosis not present

## 2014-10-05 DIAGNOSIS — Z4800 Encounter for change or removal of nonsurgical wound dressing: Secondary | ICD-10-CM | POA: Diagnosis not present

## 2014-10-05 DIAGNOSIS — M6281 Muscle weakness (generalized): Secondary | ICD-10-CM | POA: Diagnosis not present

## 2014-10-05 DIAGNOSIS — M4806 Spinal stenosis, lumbar region: Secondary | ICD-10-CM | POA: Diagnosis not present

## 2014-10-05 DIAGNOSIS — M542 Cervicalgia: Secondary | ICD-10-CM | POA: Diagnosis not present

## 2014-10-05 DIAGNOSIS — E119 Type 2 diabetes mellitus without complications: Secondary | ICD-10-CM | POA: Diagnosis not present

## 2014-10-05 DIAGNOSIS — M25511 Pain in right shoulder: Secondary | ICD-10-CM | POA: Diagnosis not present

## 2014-10-08 DIAGNOSIS — G309 Alzheimer's disease, unspecified: Secondary | ICD-10-CM | POA: Diagnosis not present

## 2014-10-08 DIAGNOSIS — M25511 Pain in right shoulder: Secondary | ICD-10-CM | POA: Diagnosis not present

## 2014-10-08 DIAGNOSIS — M810 Age-related osteoporosis without current pathological fracture: Secondary | ICD-10-CM | POA: Diagnosis not present

## 2014-10-08 DIAGNOSIS — M4806 Spinal stenosis, lumbar region: Secondary | ICD-10-CM | POA: Diagnosis not present

## 2014-10-08 DIAGNOSIS — Z9181 History of falling: Secondary | ICD-10-CM | POA: Diagnosis not present

## 2014-10-08 DIAGNOSIS — Z4800 Encounter for change or removal of nonsurgical wound dressing: Secondary | ICD-10-CM | POA: Diagnosis not present

## 2014-10-08 DIAGNOSIS — F0280 Dementia in other diseases classified elsewhere without behavioral disturbance: Secondary | ICD-10-CM | POA: Diagnosis not present

## 2014-10-08 DIAGNOSIS — E119 Type 2 diabetes mellitus without complications: Secondary | ICD-10-CM | POA: Diagnosis not present

## 2014-10-08 DIAGNOSIS — M545 Low back pain: Secondary | ICD-10-CM | POA: Diagnosis not present

## 2014-10-08 DIAGNOSIS — M24511 Contracture, right shoulder: Secondary | ICD-10-CM | POA: Diagnosis not present

## 2014-10-08 DIAGNOSIS — S81011D Laceration without foreign body, right knee, subsequent encounter: Secondary | ICD-10-CM | POA: Diagnosis not present

## 2014-10-08 DIAGNOSIS — M6281 Muscle weakness (generalized): Secondary | ICD-10-CM | POA: Diagnosis not present

## 2014-10-08 DIAGNOSIS — M542 Cervicalgia: Secondary | ICD-10-CM | POA: Diagnosis not present

## 2014-10-08 DIAGNOSIS — F329 Major depressive disorder, single episode, unspecified: Secondary | ICD-10-CM | POA: Diagnosis not present

## 2014-10-09 DIAGNOSIS — Z4800 Encounter for change or removal of nonsurgical wound dressing: Secondary | ICD-10-CM | POA: Diagnosis not present

## 2014-10-09 DIAGNOSIS — E119 Type 2 diabetes mellitus without complications: Secondary | ICD-10-CM | POA: Diagnosis not present

## 2014-10-09 DIAGNOSIS — M4806 Spinal stenosis, lumbar region: Secondary | ICD-10-CM | POA: Diagnosis not present

## 2014-10-09 DIAGNOSIS — M545 Low back pain: Secondary | ICD-10-CM | POA: Diagnosis not present

## 2014-10-09 DIAGNOSIS — M542 Cervicalgia: Secondary | ICD-10-CM | POA: Diagnosis not present

## 2014-10-09 DIAGNOSIS — S81011D Laceration without foreign body, right knee, subsequent encounter: Secondary | ICD-10-CM | POA: Diagnosis not present

## 2014-10-09 DIAGNOSIS — G309 Alzheimer's disease, unspecified: Secondary | ICD-10-CM | POA: Diagnosis not present

## 2014-10-09 DIAGNOSIS — F329 Major depressive disorder, single episode, unspecified: Secondary | ICD-10-CM | POA: Diagnosis not present

## 2014-10-09 DIAGNOSIS — Z9181 History of falling: Secondary | ICD-10-CM | POA: Diagnosis not present

## 2014-10-09 DIAGNOSIS — F0280 Dementia in other diseases classified elsewhere without behavioral disturbance: Secondary | ICD-10-CM | POA: Diagnosis not present

## 2014-10-09 DIAGNOSIS — M25511 Pain in right shoulder: Secondary | ICD-10-CM | POA: Diagnosis not present

## 2014-10-09 DIAGNOSIS — M810 Age-related osteoporosis without current pathological fracture: Secondary | ICD-10-CM | POA: Diagnosis not present

## 2014-10-09 DIAGNOSIS — M24511 Contracture, right shoulder: Secondary | ICD-10-CM | POA: Diagnosis not present

## 2014-10-09 DIAGNOSIS — M6281 Muscle weakness (generalized): Secondary | ICD-10-CM | POA: Diagnosis not present

## 2014-10-11 DIAGNOSIS — M25511 Pain in right shoulder: Secondary | ICD-10-CM | POA: Diagnosis not present

## 2014-10-11 DIAGNOSIS — E119 Type 2 diabetes mellitus without complications: Secondary | ICD-10-CM | POA: Diagnosis not present

## 2014-10-11 DIAGNOSIS — M542 Cervicalgia: Secondary | ICD-10-CM | POA: Diagnosis not present

## 2014-10-11 DIAGNOSIS — M545 Low back pain: Secondary | ICD-10-CM | POA: Diagnosis not present

## 2014-10-11 DIAGNOSIS — S81011D Laceration without foreign body, right knee, subsequent encounter: Secondary | ICD-10-CM | POA: Diagnosis not present

## 2014-10-11 DIAGNOSIS — G309 Alzheimer's disease, unspecified: Secondary | ICD-10-CM | POA: Diagnosis not present

## 2014-10-11 DIAGNOSIS — F329 Major depressive disorder, single episode, unspecified: Secondary | ICD-10-CM | POA: Diagnosis not present

## 2014-10-11 DIAGNOSIS — F0280 Dementia in other diseases classified elsewhere without behavioral disturbance: Secondary | ICD-10-CM | POA: Diagnosis not present

## 2014-10-11 DIAGNOSIS — M4806 Spinal stenosis, lumbar region: Secondary | ICD-10-CM | POA: Diagnosis not present

## 2014-10-11 DIAGNOSIS — Z9181 History of falling: Secondary | ICD-10-CM | POA: Diagnosis not present

## 2014-10-11 DIAGNOSIS — Z4800 Encounter for change or removal of nonsurgical wound dressing: Secondary | ICD-10-CM | POA: Diagnosis not present

## 2014-10-11 DIAGNOSIS — M24511 Contracture, right shoulder: Secondary | ICD-10-CM | POA: Diagnosis not present

## 2014-10-11 DIAGNOSIS — M6281 Muscle weakness (generalized): Secondary | ICD-10-CM | POA: Diagnosis not present

## 2014-10-11 DIAGNOSIS — M810 Age-related osteoporosis without current pathological fracture: Secondary | ICD-10-CM | POA: Diagnosis not present

## 2014-10-15 DIAGNOSIS — S81011D Laceration without foreign body, right knee, subsequent encounter: Secondary | ICD-10-CM | POA: Diagnosis not present

## 2014-10-15 DIAGNOSIS — M6281 Muscle weakness (generalized): Secondary | ICD-10-CM | POA: Diagnosis not present

## 2014-10-15 DIAGNOSIS — E119 Type 2 diabetes mellitus without complications: Secondary | ICD-10-CM | POA: Diagnosis not present

## 2014-10-15 DIAGNOSIS — M542 Cervicalgia: Secondary | ICD-10-CM | POA: Diagnosis not present

## 2014-10-15 DIAGNOSIS — M4806 Spinal stenosis, lumbar region: Secondary | ICD-10-CM | POA: Diagnosis not present

## 2014-10-15 DIAGNOSIS — Z4800 Encounter for change or removal of nonsurgical wound dressing: Secondary | ICD-10-CM | POA: Diagnosis not present

## 2014-10-15 DIAGNOSIS — M25511 Pain in right shoulder: Secondary | ICD-10-CM | POA: Diagnosis not present

## 2014-10-15 DIAGNOSIS — M810 Age-related osteoporosis without current pathological fracture: Secondary | ICD-10-CM | POA: Diagnosis not present

## 2014-10-15 DIAGNOSIS — F329 Major depressive disorder, single episode, unspecified: Secondary | ICD-10-CM | POA: Diagnosis not present

## 2014-10-15 DIAGNOSIS — M24511 Contracture, right shoulder: Secondary | ICD-10-CM | POA: Diagnosis not present

## 2014-10-15 DIAGNOSIS — Z9181 History of falling: Secondary | ICD-10-CM | POA: Diagnosis not present

## 2014-10-15 DIAGNOSIS — G309 Alzheimer's disease, unspecified: Secondary | ICD-10-CM | POA: Diagnosis not present

## 2014-10-15 DIAGNOSIS — M545 Low back pain: Secondary | ICD-10-CM | POA: Diagnosis not present

## 2014-10-15 DIAGNOSIS — F0280 Dementia in other diseases classified elsewhere without behavioral disturbance: Secondary | ICD-10-CM | POA: Diagnosis not present

## 2014-10-16 DIAGNOSIS — M542 Cervicalgia: Secondary | ICD-10-CM | POA: Diagnosis not present

## 2014-10-16 DIAGNOSIS — M6281 Muscle weakness (generalized): Secondary | ICD-10-CM | POA: Diagnosis not present

## 2014-10-16 DIAGNOSIS — E119 Type 2 diabetes mellitus without complications: Secondary | ICD-10-CM | POA: Diagnosis not present

## 2014-10-16 DIAGNOSIS — G309 Alzheimer's disease, unspecified: Secondary | ICD-10-CM | POA: Diagnosis not present

## 2014-10-16 DIAGNOSIS — F329 Major depressive disorder, single episode, unspecified: Secondary | ICD-10-CM | POA: Diagnosis not present

## 2014-10-16 DIAGNOSIS — S81011D Laceration without foreign body, right knee, subsequent encounter: Secondary | ICD-10-CM | POA: Diagnosis not present

## 2014-10-16 DIAGNOSIS — M545 Low back pain: Secondary | ICD-10-CM | POA: Diagnosis not present

## 2014-10-16 DIAGNOSIS — M25511 Pain in right shoulder: Secondary | ICD-10-CM | POA: Diagnosis not present

## 2014-10-16 DIAGNOSIS — M24511 Contracture, right shoulder: Secondary | ICD-10-CM | POA: Diagnosis not present

## 2014-10-16 DIAGNOSIS — M4806 Spinal stenosis, lumbar region: Secondary | ICD-10-CM | POA: Diagnosis not present

## 2014-10-16 DIAGNOSIS — Z4800 Encounter for change or removal of nonsurgical wound dressing: Secondary | ICD-10-CM | POA: Diagnosis not present

## 2014-10-16 DIAGNOSIS — M810 Age-related osteoporosis without current pathological fracture: Secondary | ICD-10-CM | POA: Diagnosis not present

## 2014-10-16 DIAGNOSIS — F0280 Dementia in other diseases classified elsewhere without behavioral disturbance: Secondary | ICD-10-CM | POA: Diagnosis not present

## 2014-10-16 DIAGNOSIS — Z9181 History of falling: Secondary | ICD-10-CM | POA: Diagnosis not present

## 2014-10-17 DIAGNOSIS — E119 Type 2 diabetes mellitus without complications: Secondary | ICD-10-CM | POA: Diagnosis not present

## 2014-10-17 DIAGNOSIS — Z9181 History of falling: Secondary | ICD-10-CM | POA: Diagnosis not present

## 2014-10-17 DIAGNOSIS — F0280 Dementia in other diseases classified elsewhere without behavioral disturbance: Secondary | ICD-10-CM | POA: Diagnosis not present

## 2014-10-17 DIAGNOSIS — M25511 Pain in right shoulder: Secondary | ICD-10-CM | POA: Diagnosis not present

## 2014-10-17 DIAGNOSIS — M810 Age-related osteoporosis without current pathological fracture: Secondary | ICD-10-CM | POA: Diagnosis not present

## 2014-10-17 DIAGNOSIS — M542 Cervicalgia: Secondary | ICD-10-CM | POA: Diagnosis not present

## 2014-10-17 DIAGNOSIS — F329 Major depressive disorder, single episode, unspecified: Secondary | ICD-10-CM | POA: Diagnosis not present

## 2014-10-17 DIAGNOSIS — Z4800 Encounter for change or removal of nonsurgical wound dressing: Secondary | ICD-10-CM | POA: Diagnosis not present

## 2014-10-17 DIAGNOSIS — G309 Alzheimer's disease, unspecified: Secondary | ICD-10-CM | POA: Diagnosis not present

## 2014-10-17 DIAGNOSIS — S81011D Laceration without foreign body, right knee, subsequent encounter: Secondary | ICD-10-CM | POA: Diagnosis not present

## 2014-10-17 DIAGNOSIS — M4806 Spinal stenosis, lumbar region: Secondary | ICD-10-CM | POA: Diagnosis not present

## 2014-10-17 DIAGNOSIS — M6281 Muscle weakness (generalized): Secondary | ICD-10-CM | POA: Diagnosis not present

## 2014-10-17 DIAGNOSIS — M545 Low back pain: Secondary | ICD-10-CM | POA: Diagnosis not present

## 2014-10-17 DIAGNOSIS — M24511 Contracture, right shoulder: Secondary | ICD-10-CM | POA: Diagnosis not present

## 2014-10-18 ENCOUNTER — Telehealth: Payer: Self-pay | Admitting: *Deleted

## 2014-10-18 DIAGNOSIS — M4806 Spinal stenosis, lumbar region: Secondary | ICD-10-CM | POA: Diagnosis not present

## 2014-10-18 DIAGNOSIS — M24511 Contracture, right shoulder: Secondary | ICD-10-CM | POA: Diagnosis not present

## 2014-10-18 DIAGNOSIS — M25511 Pain in right shoulder: Secondary | ICD-10-CM | POA: Diagnosis not present

## 2014-10-18 DIAGNOSIS — M6281 Muscle weakness (generalized): Secondary | ICD-10-CM | POA: Diagnosis not present

## 2014-10-18 DIAGNOSIS — F329 Major depressive disorder, single episode, unspecified: Secondary | ICD-10-CM | POA: Diagnosis not present

## 2014-10-18 DIAGNOSIS — M542 Cervicalgia: Secondary | ICD-10-CM | POA: Diagnosis not present

## 2014-10-18 DIAGNOSIS — M810 Age-related osteoporosis without current pathological fracture: Secondary | ICD-10-CM | POA: Diagnosis not present

## 2014-10-18 DIAGNOSIS — S81011D Laceration without foreign body, right knee, subsequent encounter: Secondary | ICD-10-CM | POA: Diagnosis not present

## 2014-10-18 DIAGNOSIS — E119 Type 2 diabetes mellitus without complications: Secondary | ICD-10-CM | POA: Diagnosis not present

## 2014-10-18 DIAGNOSIS — Z4800 Encounter for change or removal of nonsurgical wound dressing: Secondary | ICD-10-CM | POA: Diagnosis not present

## 2014-10-18 DIAGNOSIS — M545 Low back pain: Secondary | ICD-10-CM | POA: Diagnosis not present

## 2014-10-18 DIAGNOSIS — G309 Alzheimer's disease, unspecified: Secondary | ICD-10-CM | POA: Diagnosis not present

## 2014-10-18 DIAGNOSIS — Z9181 History of falling: Secondary | ICD-10-CM | POA: Diagnosis not present

## 2014-10-18 DIAGNOSIS — F0280 Dementia in other diseases classified elsewhere without behavioral disturbance: Secondary | ICD-10-CM | POA: Diagnosis not present

## 2014-10-18 NOTE — Telephone Encounter (Signed)
Received F[facility]w. Effective 10/21/2014 for pain management, positioning for dining and adapt equipment to obtain and train to improve ADL.  Given to Dr. Renato Gailseed to review and sign.

## 2014-10-24 DIAGNOSIS — Z4800 Encounter for change or removal of nonsurgical wound dressing: Secondary | ICD-10-CM | POA: Diagnosis not present

## 2014-10-24 DIAGNOSIS — M6281 Muscle weakness (generalized): Secondary | ICD-10-CM | POA: Diagnosis not present

## 2014-10-24 DIAGNOSIS — M24511 Contracture, right shoulder: Secondary | ICD-10-CM | POA: Diagnosis not present

## 2014-10-24 DIAGNOSIS — G309 Alzheimer's disease, unspecified: Secondary | ICD-10-CM | POA: Diagnosis not present

## 2014-10-24 DIAGNOSIS — F0280 Dementia in other diseases classified elsewhere without behavioral disturbance: Secondary | ICD-10-CM | POA: Diagnosis not present

## 2014-10-24 DIAGNOSIS — S81011D Laceration without foreign body, right knee, subsequent encounter: Secondary | ICD-10-CM | POA: Diagnosis not present

## 2014-10-24 DIAGNOSIS — E119 Type 2 diabetes mellitus without complications: Secondary | ICD-10-CM | POA: Diagnosis not present

## 2014-10-24 DIAGNOSIS — M25511 Pain in right shoulder: Secondary | ICD-10-CM | POA: Diagnosis not present

## 2014-10-24 DIAGNOSIS — F329 Major depressive disorder, single episode, unspecified: Secondary | ICD-10-CM | POA: Diagnosis not present

## 2014-10-24 DIAGNOSIS — M545 Low back pain: Secondary | ICD-10-CM | POA: Diagnosis not present

## 2014-10-24 DIAGNOSIS — M810 Age-related osteoporosis without current pathological fracture: Secondary | ICD-10-CM | POA: Diagnosis not present

## 2014-10-24 DIAGNOSIS — Z9181 History of falling: Secondary | ICD-10-CM | POA: Diagnosis not present

## 2014-10-24 DIAGNOSIS — M4806 Spinal stenosis, lumbar region: Secondary | ICD-10-CM | POA: Diagnosis not present

## 2014-10-24 DIAGNOSIS — M542 Cervicalgia: Secondary | ICD-10-CM | POA: Diagnosis not present

## 2014-10-25 ENCOUNTER — Other Ambulatory Visit: Payer: Self-pay | Admitting: *Deleted

## 2014-10-25 DIAGNOSIS — M542 Cervicalgia: Secondary | ICD-10-CM | POA: Diagnosis not present

## 2014-10-25 DIAGNOSIS — F0280 Dementia in other diseases classified elsewhere without behavioral disturbance: Secondary | ICD-10-CM | POA: Diagnosis not present

## 2014-10-25 DIAGNOSIS — F329 Major depressive disorder, single episode, unspecified: Secondary | ICD-10-CM | POA: Diagnosis not present

## 2014-10-25 DIAGNOSIS — M4806 Spinal stenosis, lumbar region: Secondary | ICD-10-CM | POA: Diagnosis not present

## 2014-10-25 DIAGNOSIS — G309 Alzheimer's disease, unspecified: Secondary | ICD-10-CM | POA: Diagnosis not present

## 2014-10-25 DIAGNOSIS — Z4800 Encounter for change or removal of nonsurgical wound dressing: Secondary | ICD-10-CM | POA: Diagnosis not present

## 2014-10-25 DIAGNOSIS — M25511 Pain in right shoulder: Secondary | ICD-10-CM | POA: Diagnosis not present

## 2014-10-25 DIAGNOSIS — M24511 Contracture, right shoulder: Secondary | ICD-10-CM | POA: Diagnosis not present

## 2014-10-25 DIAGNOSIS — E119 Type 2 diabetes mellitus without complications: Secondary | ICD-10-CM | POA: Diagnosis not present

## 2014-10-25 DIAGNOSIS — M545 Low back pain: Secondary | ICD-10-CM | POA: Diagnosis not present

## 2014-10-25 DIAGNOSIS — S81011D Laceration without foreign body, right knee, subsequent encounter: Secondary | ICD-10-CM | POA: Diagnosis not present

## 2014-10-25 DIAGNOSIS — M6281 Muscle weakness (generalized): Secondary | ICD-10-CM | POA: Diagnosis not present

## 2014-10-25 DIAGNOSIS — M810 Age-related osteoporosis without current pathological fracture: Secondary | ICD-10-CM | POA: Diagnosis not present

## 2014-10-25 DIAGNOSIS — Z9181 History of falling: Secondary | ICD-10-CM | POA: Diagnosis not present

## 2014-10-25 MED ORDER — HYDROCODONE-ACETAMINOPHEN 5-325 MG PO TABS: ORAL_TABLET | ORAL | Status: DC

## 2014-10-25 NOTE — Telephone Encounter (Signed)
Received Fax from [facility] for refill

## 2014-10-30 DIAGNOSIS — S81011D Laceration without foreign body, right knee, subsequent encounter: Secondary | ICD-10-CM | POA: Diagnosis not present

## 2014-10-30 DIAGNOSIS — F329 Major depressive disorder, single episode, unspecified: Secondary | ICD-10-CM | POA: Diagnosis not present

## 2014-10-30 DIAGNOSIS — M4806 Spinal stenosis, lumbar region: Secondary | ICD-10-CM | POA: Diagnosis not present

## 2014-10-30 DIAGNOSIS — M542 Cervicalgia: Secondary | ICD-10-CM | POA: Diagnosis not present

## 2014-10-30 DIAGNOSIS — Z9181 History of falling: Secondary | ICD-10-CM | POA: Diagnosis not present

## 2014-10-30 DIAGNOSIS — E119 Type 2 diabetes mellitus without complications: Secondary | ICD-10-CM | POA: Diagnosis not present

## 2014-10-30 DIAGNOSIS — Z4800 Encounter for change or removal of nonsurgical wound dressing: Secondary | ICD-10-CM | POA: Diagnosis not present

## 2014-10-30 DIAGNOSIS — M545 Low back pain: Secondary | ICD-10-CM | POA: Diagnosis not present

## 2014-10-30 DIAGNOSIS — M6281 Muscle weakness (generalized): Secondary | ICD-10-CM | POA: Diagnosis not present

## 2014-10-30 DIAGNOSIS — M810 Age-related osteoporosis without current pathological fracture: Secondary | ICD-10-CM | POA: Diagnosis not present

## 2014-10-30 DIAGNOSIS — M24511 Contracture, right shoulder: Secondary | ICD-10-CM | POA: Diagnosis not present

## 2014-10-30 DIAGNOSIS — M25511 Pain in right shoulder: Secondary | ICD-10-CM | POA: Diagnosis not present

## 2014-10-30 DIAGNOSIS — F0280 Dementia in other diseases classified elsewhere without behavioral disturbance: Secondary | ICD-10-CM | POA: Diagnosis not present

## 2014-10-30 DIAGNOSIS — G309 Alzheimer's disease, unspecified: Secondary | ICD-10-CM | POA: Diagnosis not present

## 2014-11-01 DIAGNOSIS — G309 Alzheimer's disease, unspecified: Secondary | ICD-10-CM | POA: Diagnosis not present

## 2014-11-01 DIAGNOSIS — M4806 Spinal stenosis, lumbar region: Secondary | ICD-10-CM | POA: Diagnosis not present

## 2014-11-01 DIAGNOSIS — S81011D Laceration without foreign body, right knee, subsequent encounter: Secondary | ICD-10-CM | POA: Diagnosis not present

## 2014-11-01 DIAGNOSIS — F0280 Dementia in other diseases classified elsewhere without behavioral disturbance: Secondary | ICD-10-CM | POA: Diagnosis not present

## 2014-11-01 DIAGNOSIS — M545 Low back pain: Secondary | ICD-10-CM | POA: Diagnosis not present

## 2014-11-01 DIAGNOSIS — Z9181 History of falling: Secondary | ICD-10-CM | POA: Diagnosis not present

## 2014-11-01 DIAGNOSIS — M24511 Contracture, right shoulder: Secondary | ICD-10-CM | POA: Diagnosis not present

## 2014-11-01 DIAGNOSIS — M542 Cervicalgia: Secondary | ICD-10-CM | POA: Diagnosis not present

## 2014-11-01 DIAGNOSIS — M6281 Muscle weakness (generalized): Secondary | ICD-10-CM | POA: Diagnosis not present

## 2014-11-01 DIAGNOSIS — F329 Major depressive disorder, single episode, unspecified: Secondary | ICD-10-CM | POA: Diagnosis not present

## 2014-11-01 DIAGNOSIS — M810 Age-related osteoporosis without current pathological fracture: Secondary | ICD-10-CM | POA: Diagnosis not present

## 2014-11-01 DIAGNOSIS — Z4800 Encounter for change or removal of nonsurgical wound dressing: Secondary | ICD-10-CM | POA: Diagnosis not present

## 2014-11-01 DIAGNOSIS — M25511 Pain in right shoulder: Secondary | ICD-10-CM | POA: Diagnosis not present

## 2014-11-01 DIAGNOSIS — E119 Type 2 diabetes mellitus without complications: Secondary | ICD-10-CM | POA: Diagnosis not present

## 2014-11-02 DIAGNOSIS — E119 Type 2 diabetes mellitus without complications: Secondary | ICD-10-CM | POA: Diagnosis not present

## 2014-11-02 DIAGNOSIS — S81011D Laceration without foreign body, right knee, subsequent encounter: Secondary | ICD-10-CM | POA: Diagnosis not present

## 2014-11-02 DIAGNOSIS — F329 Major depressive disorder, single episode, unspecified: Secondary | ICD-10-CM | POA: Diagnosis not present

## 2014-11-02 DIAGNOSIS — G309 Alzheimer's disease, unspecified: Secondary | ICD-10-CM | POA: Diagnosis not present

## 2014-11-02 DIAGNOSIS — Z9181 History of falling: Secondary | ICD-10-CM | POA: Diagnosis not present

## 2014-11-02 DIAGNOSIS — M24511 Contracture, right shoulder: Secondary | ICD-10-CM | POA: Diagnosis not present

## 2014-11-02 DIAGNOSIS — M4806 Spinal stenosis, lumbar region: Secondary | ICD-10-CM | POA: Diagnosis not present

## 2014-11-02 DIAGNOSIS — M542 Cervicalgia: Secondary | ICD-10-CM | POA: Diagnosis not present

## 2014-11-02 DIAGNOSIS — M6281 Muscle weakness (generalized): Secondary | ICD-10-CM | POA: Diagnosis not present

## 2014-11-02 DIAGNOSIS — M545 Low back pain: Secondary | ICD-10-CM | POA: Diagnosis not present

## 2014-11-02 DIAGNOSIS — M25511 Pain in right shoulder: Secondary | ICD-10-CM | POA: Diagnosis not present

## 2014-11-02 DIAGNOSIS — M810 Age-related osteoporosis without current pathological fracture: Secondary | ICD-10-CM | POA: Diagnosis not present

## 2014-11-02 DIAGNOSIS — Z4800 Encounter for change or removal of nonsurgical wound dressing: Secondary | ICD-10-CM | POA: Diagnosis not present

## 2014-11-02 DIAGNOSIS — F0280 Dementia in other diseases classified elsewhere without behavioral disturbance: Secondary | ICD-10-CM | POA: Diagnosis not present

## 2014-11-03 ENCOUNTER — Observation Stay (HOSPITAL_COMMUNITY)
Admission: EM | Admit: 2014-11-03 | Discharge: 2014-11-06 | Disposition: A | Discharge status: Skilled Nursing Facility | Payer: Medicare Other | Attending: Internal Medicine | Admitting: Internal Medicine

## 2014-11-03 ENCOUNTER — Emergency Department (HOSPITAL_COMMUNITY): Payer: Medicare Other

## 2014-11-03 ENCOUNTER — Encounter (HOSPITAL_COMMUNITY): Payer: Self-pay

## 2014-11-03 DIAGNOSIS — G2581 Restless legs syndrome: Secondary | ICD-10-CM | POA: Diagnosis not present

## 2014-11-03 DIAGNOSIS — F0280 Dementia in other diseases classified elsewhere without behavioral disturbance: Secondary | ICD-10-CM | POA: Insufficient documentation

## 2014-11-03 DIAGNOSIS — M25552 Pain in left hip: Secondary | ICD-10-CM | POA: Insufficient documentation

## 2014-11-03 DIAGNOSIS — W19XXXA Unspecified fall, initial encounter: Secondary | ICD-10-CM | POA: Diagnosis not present

## 2014-11-03 DIAGNOSIS — J449 Chronic obstructive pulmonary disease, unspecified: Secondary | ICD-10-CM | POA: Insufficient documentation

## 2014-11-03 DIAGNOSIS — M810 Age-related osteoporosis without current pathological fracture: Secondary | ICD-10-CM | POA: Insufficient documentation

## 2014-11-03 DIAGNOSIS — Z79899 Other long term (current) drug therapy: Secondary | ICD-10-CM | POA: Diagnosis not present

## 2014-11-03 DIAGNOSIS — S8992XA Unspecified injury of left lower leg, initial encounter: Secondary | ICD-10-CM | POA: Diagnosis not present

## 2014-11-03 DIAGNOSIS — Y929 Unspecified place or not applicable: Secondary | ICD-10-CM | POA: Insufficient documentation

## 2014-11-03 DIAGNOSIS — M25569 Pain in unspecified knee: Secondary | ICD-10-CM

## 2014-11-03 DIAGNOSIS — Z91011 Allergy to milk products: Secondary | ICD-10-CM | POA: Insufficient documentation

## 2014-11-03 DIAGNOSIS — E559 Vitamin D deficiency, unspecified: Secondary | ICD-10-CM | POA: Diagnosis not present

## 2014-11-03 DIAGNOSIS — M79652 Pain in left thigh: Secondary | ICD-10-CM | POA: Diagnosis not present

## 2014-11-03 DIAGNOSIS — I10 Essential (primary) hypertension: Secondary | ICD-10-CM | POA: Diagnosis present

## 2014-11-03 DIAGNOSIS — S79912A Unspecified injury of left hip, initial encounter: Secondary | ICD-10-CM | POA: Diagnosis not present

## 2014-11-03 DIAGNOSIS — Z888 Allergy status to other drugs, medicaments and biological substances status: Secondary | ICD-10-CM | POA: Insufficient documentation

## 2014-11-03 DIAGNOSIS — R262 Difficulty in walking, not elsewhere classified: Secondary | ICD-10-CM | POA: Diagnosis not present

## 2014-11-03 DIAGNOSIS — J45909 Unspecified asthma, uncomplicated: Secondary | ICD-10-CM | POA: Diagnosis not present

## 2014-11-03 DIAGNOSIS — G309 Alzheimer's disease, unspecified: Secondary | ICD-10-CM | POA: Insufficient documentation

## 2014-11-03 DIAGNOSIS — M1990 Unspecified osteoarthritis, unspecified site: Secondary | ICD-10-CM | POA: Insufficient documentation

## 2014-11-03 DIAGNOSIS — F419 Anxiety disorder, unspecified: Secondary | ICD-10-CM | POA: Diagnosis not present

## 2014-11-03 DIAGNOSIS — F329 Major depressive disorder, single episode, unspecified: Secondary | ICD-10-CM | POA: Insufficient documentation

## 2014-11-03 DIAGNOSIS — I129 Hypertensive chronic kidney disease with stage 1 through stage 4 chronic kidney disease, or unspecified chronic kidney disease: Secondary | ICD-10-CM | POA: Insufficient documentation

## 2014-11-03 DIAGNOSIS — E119 Type 2 diabetes mellitus without complications: Secondary | ICD-10-CM | POA: Insufficient documentation

## 2014-11-03 DIAGNOSIS — M549 Dorsalgia, unspecified: Secondary | ICD-10-CM | POA: Diagnosis not present

## 2014-11-03 DIAGNOSIS — S72309A Unspecified fracture of shaft of unspecified femur, initial encounter for closed fracture: Secondary | ICD-10-CM

## 2014-11-03 DIAGNOSIS — M25562 Pain in left knee: Principal | ICD-10-CM | POA: Insufficient documentation

## 2014-11-03 DIAGNOSIS — N181 Chronic kidney disease, stage 1: Secondary | ICD-10-CM | POA: Diagnosis not present

## 2014-11-03 DIAGNOSIS — R627 Adult failure to thrive: Secondary | ICD-10-CM | POA: Insufficient documentation

## 2014-11-03 DIAGNOSIS — R001 Bradycardia, unspecified: Secondary | ICD-10-CM | POA: Diagnosis not present

## 2014-11-03 DIAGNOSIS — Z882 Allergy status to sulfonamides status: Secondary | ICD-10-CM | POA: Diagnosis not present

## 2014-11-03 DIAGNOSIS — S79922A Unspecified injury of left thigh, initial encounter: Secondary | ICD-10-CM | POA: Diagnosis not present

## 2014-11-03 DIAGNOSIS — I499 Cardiac arrhythmia, unspecified: Secondary | ICD-10-CM | POA: Diagnosis not present

## 2014-11-03 DIAGNOSIS — S3992XA Unspecified injury of lower back, initial encounter: Secondary | ICD-10-CM | POA: Diagnosis not present

## 2014-11-03 LAB — CBC WITH DIFFERENTIAL/PLATELET
BASOS ABS: 0 10*3/uL (ref 0.0–0.1)
Basophils Relative: 0 % (ref 0–1)
Eosinophils Absolute: 0.1 10*3/uL (ref 0.0–0.7)
Eosinophils Relative: 1 % (ref 0–5)
HEMATOCRIT: 36.5 % (ref 36.0–46.0)
HEMOGLOBIN: 12.1 g/dL (ref 12.0–15.0)
LYMPHS PCT: 22 % (ref 12–46)
Lymphs Abs: 1.9 10*3/uL (ref 0.7–4.0)
MCH: 28.6 pg (ref 26.0–34.0)
MCHC: 33.2 g/dL (ref 30.0–36.0)
MCV: 86.3 fL (ref 78.0–100.0)
Monocytes Absolute: 0.7 10*3/uL (ref 0.1–1.0)
Monocytes Relative: 8 % (ref 3–12)
Neutro Abs: 5.6 10*3/uL (ref 1.7–7.7)
Neutrophils Relative %: 69 % (ref 43–77)
Platelets: 222 10*3/uL (ref 150–400)
RBC: 4.23 MIL/uL (ref 3.87–5.11)
RDW: 13.1 % (ref 11.5–15.5)
WBC: 8.3 10*3/uL (ref 4.0–10.5)

## 2014-11-03 LAB — COMPREHENSIVE METABOLIC PANEL
ALT: 15 U/L (ref 0–35)
AST: 22 U/L (ref 0–37)
Albumin: 3.8 g/dL (ref 3.5–5.2)
Alkaline Phosphatase: 74 U/L (ref 39–117)
Anion gap: 10 (ref 5–15)
BUN: 26 mg/dL — AB (ref 6–23)
CALCIUM: 9.2 mg/dL (ref 8.4–10.5)
CO2: 24 mmol/L (ref 19–32)
Chloride: 107 mmol/L (ref 96–112)
Creatinine, Ser: 1 mg/dL (ref 0.50–1.10)
GFR calc non Af Amer: 46 mL/min — ABNORMAL LOW (ref >90)
GFR, EST AFRICAN AMERICAN: 53 mL/min — AB (ref >90)
Glucose, Bld: 89 mg/dL (ref 70–99)
Potassium: 4.4 mmol/L (ref 3.5–5.1)
Sodium: 141 mmol/L (ref 135–145)
Total Bilirubin: 0.5 mg/dL (ref 0.3–1.2)
Total Protein: 6.6 g/dL (ref 6.0–8.3)

## 2014-11-03 LAB — I-STAT TROPONIN, ED: TROPONIN I, POC: 0.01 ng/mL (ref 0.00–0.08)

## 2014-11-03 LAB — GLUCOSE, CAPILLARY: GLUCOSE-CAPILLARY: 87 mg/dL (ref 70–99)

## 2014-11-03 MED ORDER — HYDROCODONE-ACETAMINOPHEN 5-325 MG PO TABS
1.0000 | ORAL_TABLET | Freq: Once | ORAL | Status: AC
  Administered 2014-11-03: 1 via ORAL
  Filled 2014-11-03: qty 1

## 2014-11-03 MED ORDER — MIRTAZAPINE 7.5 MG PO TABS
7.5000 mg | ORAL_TABLET | Freq: Every day | ORAL | Status: DC
  Administered 2014-11-03 – 2014-11-05 (×3): 7.5 mg via ORAL
  Filled 2014-11-03 (×5): qty 1

## 2014-11-03 MED ORDER — FUROSEMIDE 20 MG PO TABS
20.0000 mg | ORAL_TABLET | ORAL | Status: DC
  Administered 2014-11-05: 20 mg via ORAL
  Filled 2014-11-03: qty 1

## 2014-11-03 MED ORDER — ONDANSETRON HCL 4 MG/2ML IJ SOLN
4.0000 mg | Freq: Four times a day (QID) | INTRAMUSCULAR | Status: DC | PRN
Start: 2014-11-03 — End: 2014-11-06

## 2014-11-03 MED ORDER — ONDANSETRON HCL 4 MG PO TABS: 4.0000 mg | ORAL_TABLET | Freq: Four times a day (QID) | ORAL | Status: DC | PRN

## 2014-11-03 MED ORDER — HYDROCODONE-ACETAMINOPHEN 5-325 MG PO TABS
1.0000 | ORAL_TABLET | Freq: Two times a day (BID) | ORAL | Status: DC | PRN
  Administered 2014-11-04: 2 via ORAL
  Administered 2014-11-04: 1 via ORAL
  Administered 2014-11-05: 2 via ORAL
  Filled 2014-11-03: qty 2
  Filled 2014-11-03: qty 1
  Filled 2014-11-03: qty 2

## 2014-11-03 MED ORDER — MORPHINE SULFATE 2 MG/ML IJ SOLN: 2.0000 mg | INTRAMUSCULAR | Status: DC | PRN

## 2014-11-03 MED ORDER — LISINOPRIL 20 MG PO TABS
20.0000 mg | ORAL_TABLET | Freq: Every day | ORAL | Status: DC
  Administered 2014-11-04 – 2014-11-06 (×3): 20 mg via ORAL
  Filled 2014-11-03 (×3): qty 1

## 2014-11-03 MED ORDER — HEPARIN SODIUM (PORCINE) 5000 UNIT/ML IJ SOLN
5000.0000 [IU] | Freq: Three times a day (TID) | INTRAMUSCULAR | Status: DC
  Administered 2014-11-03 – 2014-11-06 (×9): 5000 [IU] via SUBCUTANEOUS
  Filled 2014-11-03 (×10): qty 1

## 2014-11-03 MED ORDER — INSULIN ASPART 100 UNIT/ML ~~LOC~~ SOLN: 0.0000 [IU] | Freq: Every day | SUBCUTANEOUS | Status: DC

## 2014-11-03 MED ORDER — AMLODIPINE BESYLATE 10 MG PO TABS
10.0000 mg | ORAL_TABLET | Freq: Every day | ORAL | Status: DC
  Administered 2014-11-04 – 2014-11-06 (×3): 10 mg via ORAL
  Filled 2014-11-03 (×3): qty 1

## 2014-11-03 MED ORDER — ACETAMINOPHEN 650 MG RE SUPP: 650.0000 mg | Freq: Four times a day (QID) | RECTAL | Status: DC | PRN

## 2014-11-03 MED ORDER — CLONAZEPAM 1 MG PO TABS
1.0000 mg | ORAL_TABLET | Freq: Every evening | ORAL | Status: DC | PRN
  Administered 2014-11-05: 1 mg via ORAL
  Filled 2014-11-03: qty 1

## 2014-11-03 MED ORDER — ACETAMINOPHEN 325 MG PO TABS
650.0000 mg | ORAL_TABLET | Freq: Four times a day (QID) | ORAL | Status: DC | PRN
  Administered 2014-11-04 – 2014-11-06 (×3): 650 mg via ORAL
  Filled 2014-11-03 (×3): qty 2

## 2014-11-03 MED ORDER — INSULIN ASPART 100 UNIT/ML ~~LOC~~ SOLN
0.0000 [IU] | Freq: Three times a day (TID) | SUBCUTANEOUS | Status: DC
  Administered 2014-11-04 (×2): 2 [IU] via SUBCUTANEOUS
  Administered 2014-11-05: 3 [IU] via SUBCUTANEOUS

## 2014-11-03 NOTE — ED Notes (Signed)
Social worker and case management on the way to speak with patient and family.

## 2014-11-03 NOTE — ED Notes (Signed)
Patient from [facility] where she fell 7 hours ago.Patient is complaining of left knee pain with some swelling present. Family is requesting patient be evaluated. Patients HR is irregular 30-70's per EMS. Patient is alert and oriented x4 and in NAD. Denies SOB, n/v, or diaphoresis. Denies blood thinners. BP 183/85 per [facility] staff, patient does not have a hx of HTN, but takes amlodipine and lisinopril.

## 2014-11-03 NOTE — Progress Notes (Signed)
CSW and Nurse CM went to speak with this patient's daughter as requested.  Patient is a 81 y/o, Caucasian, female that resides in an [facility].  Patient was brought to ED after falling off her bed at the ALF.  Patient appears weak and reports little pain while she lies.  Patient was seen by X-Ray, no fractures or anything remarkable was noticed  By Radiology.  Patient's daughter wants the patient to go to a SNF. Patient's daughter states she cannot go home with her as she and her husband work full-time.  CSW offered that as there is no medical reason to admit the patient would need to return home or to the ALF and could be followed up by the patient's PCP on Monday for FL-2 authorization, PT eval, and referral to SNF.  As of today patient has a safe home environment to return to at [facility].  Patient's daughter was not satisfied with the CSW's recommendations, and asked to see the Attending.  [facility] Rashia Mckesson Macy MisLCSW,LCAS [place] ED CSW [phone]

## 2014-11-03 NOTE — ED Notes (Addendum)
Pt helped up to attempt to ambulate. Pt was able to stand with staff assistance. Took a couple of steps but leaned on staff to assist. Provider informed

## 2014-11-03 NOTE — ED Notes (Signed)
Pt back from x-ray, placed back on cardiac monitor. Pt used the bedpan with staff assistance at this time

## 2014-11-03 NOTE — Progress Notes (Signed)
11/03/2014 12:54 PM Afsheen Antony SPARKS, RN,BSN      In to speak with daughter regarding patient's discharge plan. Daughter states that patient is receiving home health care and physical [facility], "for the next month". Daughter states, "I am just not sure how she will be able to function in this much pain when she leaves here. She is usually pretty independent but she cannot even bear weight on her leg because of the pain right now." [facility] durable equipment. States, "she uses her walker but she won't use a bedside commode". Daughter asked about temporary rehab facilities for assistance. Will notify EDCSW to follow up.

## 2014-11-03 NOTE — H&P (Signed)
[facility] History and Physical  [name] XBJ:[id number] DOB: Nov 29, 1915 DOA: 11/03/2014  Referring physician: Emergency department PCP: Bufford Spikes, DO   Chief Complaint: Knee pain  HPI: [name] is a 81 y.o. female  With a hx of dementia, copd, anxiety who presents from ALF after falling out of bed without LOC. This resulted in pain in the L knee. Pt was then brought to the ED where imaging was neg for acute fracture. The patient was initially planned for d/c from ED, however family was not satisfied with discharge with concerns of poor mobility s/p fall. RN attempted to ambulate patient and per staff, pt required near full assist in ED. Hospitalist was consulted for consideration for admission.  Review of Systems:  Per above, the remainder of the 10pt ros reviewed and are neg  Past Medical History  Diagnosis Date  . COPD (chronic obstructive pulmonary disease)   . Hypertension   . Oral aphthae   . Orthostatic hypotension   . Senile osteoporosis   . Atrophy of vulva   . Unspecified urinary incontinence   . Urinary frequency   . Unspecified vitamin D deficiency   . Anxiety state, unspecified   . Restless legs syndrome (RLS)   . Unspecified disorder of kidney and ureter   . Atrioventricular block, unspecified   . Closed dislocation of shoulder, unspecified site   . Anemia, unspecified   . Altered mental status   . Lumbago   . Transient disorder of initiating or maintaining sleep   . Cellulitis and abscess of unspecified site   . Sebaceous cyst   . Insomnia, unspecified   . Dementia in conditions classified elsewhere without behavioral disturbance   . Depressive disorder, not elsewhere classified   . Abnormality of gait   . Adult failure to thrive   . Headache(784.0)   . Type II or unspecified type diabetes mellitus without mention of complication, not stated as uncontrolled   . Gout, unspecified   . Extrinsic asthma, unspecified   . Chronic kidney  disease, stage I   . Osteoarthrosis, unspecified whether generalized or localized, unspecified site   . Spinal stenosis, unspecified region other than cervical   . Edema   . Tachycardia, unspecified    Past Surgical History  Procedure Laterality Date  . Hip surgery      (R) hip replacement  . Cesarean section    . Abdominal hysterectomy      partial  . Back surgery      fusion  . Breast surgery      bilateral breast reduction  . Eye surgery      bilateral cataract  . Basal cell carcinoma excision    . Basal cell carcinoma excision      face  . Hip closed reduction Right 08/07/2013    Procedure: ATTEMPTED CLOSED MANIPULATION HIP;  Surgeon: Jodi Marble, MD;  Location: [facility] OR;  Service: Orthopedics;  Laterality: Right;  . Total hip revision Right 08/08/2013    Procedure: TOTAL HIP REVISION- right;  Surgeon: Velna Ochs, MD;  Location: [facility] OR;  Service: Orthopedics;  Laterality: Right;   Social History:  reports that she has never smoked. She has never used smokeless tobacco. She reports that she does not drink alcohol or use illicit drugs.  Allergies  Allergen Reactions  . Sulfa Antibiotics Shortness Of Breath    Worsens asthma  . Clonidine Derivatives   . Lactose Intolerance (Gi)   . Verapamil  NH MAR    Family History  Problem Relation Age of Onset  . Diabetes Mother   . Diabetes Father   . Stroke Sister   . Heart attack Brother     Prior to Admission medications   Medication Sig Start Date End Date Taking? Authorizing Provider  acetaminophen (TYLENOL) 325 MG tablet Take 650 mg by mouth every 6 (six) hours as needed for mild pain. Every 8 hours as needed for pain   Yes Historical Provider, MD  amLODipine (NORVASC) 10 MG tablet Take 1 tablet (10 mg total) by mouth daily. 11/02/13  Yes Richardean Canal, MD  clonazePAM Scarlette Calico) 1 MG tablet Take one tablet by mouth at bedtime for anxiety 09/19/14  Yes Monica Shannon, DO  furosemide (LASIX) 20 MG tablet  Take 1 tablet (20 mg total) by mouth every other day. For chf 04/30/14  Yes Tiffany L Reed, DO  HYDROcodone-acetaminophen (NORCO/VICODIN) 5-325 MG per tablet Take one tablet by mouth twice daily for pain 10/25/14  Yes Sharon Seller, NP  lisinopril (PRINIVIL,ZESTRIL) 20 MG tablet Take 1 tablet (20 mg total) by mouth daily. Patient taking differently: Take 20 mg by mouth at bedtime.  11/02/13  Yes Richardean Canal, MD  mirtazapine (REMERON) 7.5 MG tablet Take 1 tablet (7.5 mg total) by mouth at bedtime. 04/09/14  Yes Tiffany L Reed, DO  Polyethyl Glycol-Propyl Glycol (SYSTANE) 0.4-0.3 % SOLN Instill one drop into each eye two times daily to relieve dry,itchy eye. 02/01/14  Yes Sharon Seller, NP  potassium chloride SA (K-DUR,KLOR-CON) 20 MEQ tablet Take 1 tablet (20 mEq total) by mouth every other day. For chf 04/30/14  Yes Tiffany L Reed, DO  senna-docusate (SENOKOT-S) 8.6-50 MG per tablet Take 1 tablet by mouth daily.   Yes Historical Provider, MD  triamcinolone cream (KENALOG) 0.5 % Apply 1 application topically 2 (two) times daily. To rash on legs 04/30/14  Yes Tiffany L Reed, DO  capsicum oleoresin (TRIXAICIN) 0.025 % cream Apply to right shoulder and upper arm three times daily Patient taking differently: Apply 1 application topically 3 (three) times daily as needed (Pain).  07/30/14   Kermit Balo, DO   Physical Exam: Filed Vitals:   11/03/14 1118 11/03/14 1200 11/03/14 1345 11/03/14 1430  BP: 142/61 147/64 140/72 167/57  Pulse: 62 45 50 60  Temp:      TempSrc:      Resp: 18 18 18 20   SpO2: 91% 92% 99% 94%    Wt Readings from Last 3 Encounters:  08/16/14 50.984 kg (112 lb 6.4 oz)  04/30/14 44.543 kg (98 lb 3.2 oz)  04/09/14 46.358 kg (102 lb 3.2 oz)    General:  Appears calm and comfortable Eyes: PERRL, normal lids, irises & conjunctiva ENT: grossly normal hearing, lips & tongue Neck: no LAD, masses or thyromegaly Cardiovascular: RRR, no m/r/g. No LE edema. Telemetry: SR, no arrhythmias   Respiratory: CTA bilaterally, no w/r/r. Normal respiratory effort. Abdomen: soft, ntnd Skin: no rash or induration seen on limited exam Musculoskeletal: markedly tender L knee without palpable effusion, perfused Psychiatric: grossly normal mood and affect, speech fluent and appropriate Neurologic: grossly non-focal.          Labs on Admission:  Basic Metabolic Panel:  Recent Labs Lab 11/03/14 0842  NA 141  K 4.4  CL 107  CO2 24  GLUCOSE 89  BUN 26*  CREATININE 1.00  CALCIUM 9.2   Liver Function Tests:  Recent Labs Lab 11/03/14 [phone]  AST 22  ALT 15  ALKPHOS 74  BILITOT 0.5  PROT 6.6  ALBUMIN 3.8   No results for input(s): LIPASE, AMYLASE in the last 168 hours. No results for input(s): AMMONIA in the last 168 hours. CBC:  Recent Labs Lab 11/03/14 0842  WBC 8.3  NEUTROABS 5.6  HGB 12.1  HCT 36.5  MCV 86.3  PLT 222   Cardiac Enzymes: No results for input(s): CKTOTAL, CKMB, CKMBINDEX, TROPONINI in the last 168 hours.  BNP (last 3 results) No results for input(s): BNP in the last 8760 hours.  ProBNP (last 3 results) No results for input(s): PROBNP in the last 8760 hours.  CBG: No results for input(s): GLUCAP in the last 168 hours.  Radiological Exams on Admission: Dg Lumbar Spine Complete  11/03/2014   CLINICAL DATA:  Post fall, now at back pain  EXAM: LUMBAR SPINE - COMPLETE 4+ VIEW  COMPARISON:  11/26/2013  FINDINGS: Osteopenia. Examination is further degraded secondary to patient positioning.  Post L2-L3 paraspinal fusion. No definite evidence of hardware failure loosening. Laminectomy defects are noted within the lower lumbar spine extending from L2 through L5. There is unchanged osseous fusion involving the posterior elements of L2 through S1.  Unchanged mild scoliotic curvature of the thoracolumbar spine with dominant caudal component convex to the right. Unchanged mild (approximately 6 mm) of anterolisthesis of L4 upon L5.  Mild (under 25%)  compression deformity involving the inferior endplate of the L1 vertebral body, grossly unchanged since the 11/2013 examination. Remaining vertebral body heights appear preserved.  There is unchanged mild DDD of T12-L1 and L1-L2 with disc space height loss, endplate irregularity and sclerosis.  Post right total hip replacement, incompletely evaluated. Atherosclerotic plaque within the abdominal aorta. Extensive vascular calcifications within the splenic artery.  Moderate colonic stool burden without evidence of obstruction.  IMPRESSION: 1. No definite acute findings. 2. Extensive postsurgical change of the lumbar spine as above.   Electronically Signed   By: Simonne ComeJohn  Watts M.D.   On: 11/03/2014 08:02   Dg Knee Complete 4 Views Left  11/03/2014   CLINICAL DATA:  Post fall, now with left knee pain.  EXAM: LEFT KNEE - COMPLETE 4+ VIEW  COMPARISON:  Left tibia and fibular radiographs- 11/05/2006  FINDINGS: Osteopenia. No displaced fracture or dislocation. No joint effusion chondrocalcinosis is noted within the medial and lateral joint spaces. There is mild degenerative change of the knee, worse within the lateral compartment with joint space loss, subchondral sclerosis and osteophytosis. Vascular calcifications. No radiopaque foreign body.  IMPRESSION: 1. No acute findings. 2. Chondrocalcinosis suggestive of CPPD. 3. Mild degenerative change, worse within the lateral compartment.   Electronically Signed   By: Simonne ComeJohn  Watts M.D.   On: 11/03/2014 07:57   Dg Hip Unilat With Pelvis 2-3 Views Left  11/03/2014   CLINICAL DATA:  Fall, left hip pain.  EXAM: LEFT HIP (WITH PELVIS) 2-3 VIEWS  COMPARISON:  None.  FINDINGS: No definite fracture or dislocation. Mild subchondral sclerosis and osteophytosis in the left hip joint. Right hip arthroplasty. Obturator rings appear intact.  IMPRESSION: 1. No definite acute fracture or dislocation. 2. Mild left hip osteoarthritis.   Electronically Signed   By: Leanna BattlesMelinda  Blietz M.D.   On:  11/03/2014 13:32   Dg Femur Min 2 Views Left  11/03/2014   CLINICAL DATA:  Fall with left hip pain.  EXAM: LEFT FEMUR 2 VIEWS  COMPARISON:  11/03/2014 and 09/24/2012  FINDINGS: Exam demonstrates diffuse decreased bone mineralization. There mild degenerate  changes of the hip and knee. No acute fracture or dislocation. Atherosclerotic disease of the femoral artery.  IMPRESSION: No acute findings.   Electronically Signed   By: Elberta Fortis M.D.   On: 11/03/2014 13:33    EKG: Independently reviewed. NSR  Assessment/Plan Principal Problem:   Inability to ambulate due to knee Active Problems:   Alzheimer's disease   Essential hypertension   Vitamin D deficiency   1. Difficulty ambulating s/p fall with L knee pain 1. Discussed patient with ED staff. Per staff, pt required much assistance to ambulate 2. Xrays without acute fracture 3. Will consult PT/OT 4. Consult SW for possible placement 5. Analgesics as tolerated 6. Admit to med-surg, obs 2. Dementia 1. Seems stable 2. Cont home regimen 3. HTN 1. Stable 2. Cont home meds 4. DM 1. Cont on SSI  5. DVT prophylaxis 1. Heparin subq  Code Status: Full DVT Prophylaxis: Family Communication: Pt in room Disposition Plan: Pending (indicate anticipated LOS)   Cabot Cromartie K [facility] Pager [phone]

## 2014-11-03 NOTE — ED Notes (Signed)
Provider at the bedside.  

## 2014-11-03 NOTE — ED Provider Notes (Signed)
CSN: [id number]     Arrival date & time 11/03/14  0616 History   First MD Initiated Contact with Patient 11/03/14 [phone]     Chief Complaint  Patient presents with  . Fall     (Consider location/radiation/quality/duration/timing/severity/associated sxs/prior Treatment) HPI Virtie Bungert is a 81 year old female with past medical history of osteoporosis, Alzheimer's dementia, AV block who was brought to the ER by EMS after a fall at [facility]. Patient states she recalls sitting on the edge of her bed speaking to her roommate, when she "slid off the edge of the bed". Patient states she thinks she was "just too close to the edge of the bed". Pt denies syncope, dizziness, weakness, chest pain, shortness of breath, abdominal pain, nausea, vomiting, diarrhea.  Pt complains of left knee pain and swelling along with low back pain.    Past Medical History  Diagnosis Date  . COPD (chronic obstructive pulmonary disease)   . Hypertension   . Oral aphthae   . Orthostatic hypotension   . Senile osteoporosis   . Atrophy of vulva   . Unspecified urinary incontinence   . Urinary frequency   . Unspecified vitamin D deficiency   . Anxiety state, unspecified   . Restless legs syndrome (RLS)   . Unspecified disorder of kidney and ureter   . Atrioventricular block, unspecified   . Closed dislocation of shoulder, unspecified site   . Anemia, unspecified   . Altered mental status   . Lumbago   . Transient disorder of initiating or maintaining sleep   . Cellulitis and abscess of unspecified site   . Sebaceous cyst   . Insomnia, unspecified   . Dementia in conditions classified elsewhere without behavioral disturbance   . Depressive disorder, not elsewhere classified   . Abnormality of gait   . Adult failure to thrive   . Headache(784.0)   . Type II or unspecified type diabetes mellitus without mention of complication, not stated as uncontrolled   . Gout, unspecified   . Extrinsic  asthma, unspecified   . Chronic kidney disease, stage I   . Osteoarthrosis, unspecified whether generalized or localized, unspecified site   . Spinal stenosis, unspecified region other than cervical   . Edema   . Tachycardia, unspecified    Past Surgical History  Procedure Laterality Date  . Hip surgery      (R) hip replacement  . Cesarean section    . Abdominal hysterectomy      partial  . Back surgery      fusion  . Breast surgery      bilateral breast reduction  . Eye surgery      bilateral cataract  . Basal cell carcinoma excision    . Basal cell carcinoma excision      face  . Hip closed reduction Right 08/07/2013    Procedure: ATTEMPTED CLOSED MANIPULATION HIP;  Surgeon: Jodi Marble, MD;  [facility]: [facility] OR;  Service: Orthopedics;  Laterality: Right;  . Total hip revision Right 08/08/2013    Procedure: TOTAL HIP REVISION- right;  Surgeon: Velna Ochs, MD;  Location: [facility] OR;  Service: Orthopedics;  Laterality: Right;   Family History  Problem Relation Age of Onset  . Diabetes Mother   . Diabetes Father   . Stroke Sister   . Heart attack Brother    History  Substance Use Topics  . Smoking status: Never Smoker   . Smokeless tobacco: Never Used  . Alcohol Use: No  OB History    No data available     Review of Systems  Constitutional: Negative for fever.  HENT: Negative for trouble swallowing.   Eyes: Negative for visual disturbance.  Respiratory: Negative for shortness of breath.   Cardiovascular: Negative for chest pain.  Gastrointestinal: Negative for nausea, vomiting and abdominal pain.  Genitourinary: Negative for dysuria.  Musculoskeletal: Positive for back pain and arthralgias. Negative for neck pain.  Skin: Negative for rash.  Neurological: Negative for dizziness, weakness and numbness.  Psychiatric/Behavioral: Negative.       Allergies  Sulfa antibiotics; Clonidine derivatives; Lactose intolerance (gi); and Verapamil  Home Medications     Prior to Admission medications   Medication Sig Start Date End Date Taking? Authorizing Provider  acetaminophen (TYLENOL) 325 MG tablet Take 650 mg by mouth every 6 (six) hours as needed for mild pain. Every 8 hours as needed for pain   Yes Historical Provider, MD  amLODipine (NORVASC) 10 MG tablet Take 1 tablet (10 mg total) by mouth daily. 11/02/13  Yes Richardean Canal, MD  clonazePAM Scarlette Calico) 1 MG tablet Take one tablet by mouth at bedtime for anxiety 09/19/14  Yes Monica Ozark, DO  furosemide (LASIX) 20 MG tablet Take 1 tablet (20 mg total) by mouth every other day. For chf 04/30/14  Yes Tiffany L Reed, DO  HYDROcodone-acetaminophen (NORCO/VICODIN) 5-325 MG per tablet Take one tablet by mouth twice daily for pain 10/25/14  Yes Sharon Seller, NP  lisinopril (PRINIVIL,ZESTRIL) 20 MG tablet Take 1 tablet (20 mg total) by mouth daily. Patient taking differently: Take 20 mg by mouth at bedtime.  11/02/13  Yes Richardean Canal, MD  mirtazapine (REMERON) 7.5 MG tablet Take 1 tablet (7.5 mg total) by mouth at bedtime. 04/09/14  Yes Tiffany L Reed, DO  Polyethyl Glycol-Propyl Glycol (SYSTANE) 0.4-0.3 % SOLN Instill one drop into each eye two times daily to relieve dry,itchy eye. 02/01/14  Yes Sharon Seller, NP  potassium chloride SA (K-DUR,KLOR-CON) 20 MEQ tablet Take 1 tablet (20 mEq total) by mouth every other day. For chf 04/30/14  Yes Tiffany L Reed, DO  senna-docusate (SENOKOT-S) 8.6-50 MG per tablet Take 1 tablet by mouth daily.   Yes Historical Provider, MD  triamcinolone cream (KENALOG) 0.5 % Apply 1 application topically 2 (two) times daily. To rash on legs 04/30/14  Yes Tiffany L Reed, DO  capsicum oleoresin (TRIXAICIN) 0.025 % cream Apply to right shoulder and upper arm three times daily Patient taking differently: Apply 1 application topically 3 (three) times daily as needed (Pain).  07/30/14   Tiffany L Reed, DO   BP 167/87 mmHg  Pulse 50  Temp(Src) 97.9 F (36.6 C) (Oral)  Resp 20   SpO2 99% Physical Exam  Constitutional: She is oriented to person, place, and time. She appears well-developed and well-nourished. No distress.  HENT:  Head: Normocephalic and atraumatic.  Mouth/Throat: Oropharynx is clear and moist. No oropharyngeal exudate.  Eyes: Right eye exhibits no discharge. Left eye exhibits no discharge. No scleral icterus.  Neck: Normal range of motion.  Cardiovascular: Normal rate, regular rhythm and normal heart sounds.   No murmur heard. Pulmonary/Chest: Effort normal and breath sounds normal. No respiratory distress.  Abdominal: Soft. There is no tenderness.  Musculoskeletal: Normal range of motion. She exhibits no edema.       Left knee: She exhibits swelling and effusion. She exhibits no ecchymosis, no deformity, no laceration, no erythema and no LCL laxity. Tenderness found.  Back:  Left knee exam: Moderate effusion noted. Diffuse tenderness to palpation around the joint line. No erythema or warmth. DP pulse 2+. Distal sensation intact.  Mild spinous process tenderness diffusely along the lower aspect of lumbar spine.  Neurological: She is alert and oriented to person, place, and time. No cranial nerve deficit. Coordination normal.  Skin: Skin is warm and dry. No rash noted. She is not diaphoretic.  Psychiatric: She has a normal mood and affect.  Nursing note and vitals reviewed.   ED Course  Procedures (including critical care time) Labs Review Labs Reviewed  COMPREHENSIVE METABOLIC PANEL - Abnormal; Notable for the following:    BUN 26 (*)    GFR calc non Af Amer 46 (*)    GFR calc Af Amer 53 (*)    All other components within normal limits  CBC WITH DIFFERENTIAL/PLATELET  CBC  CREATININE, SERUM  COMPREHENSIVE METABOLIC PANEL  CBC  I-STAT TROPOININ, ED    Imaging Review Dg Lumbar Spine Complete  11/03/2014   CLINICAL DATA:  Post fall, now at back pain  EXAM: LUMBAR SPINE - COMPLETE 4+ VIEW  COMPARISON:  11/26/2013  FINDINGS:  Osteopenia. Examination is further degraded secondary to patient positioning.  Post L2-L3 paraspinal fusion. No definite evidence of hardware failure loosening. Laminectomy defects are noted within the lower lumbar spine extending from L2 through L5. There is unchanged osseous fusion involving the posterior elements of L2 through S1.  Unchanged mild scoliotic curvature of the thoracolumbar spine with dominant caudal component convex to the right. Unchanged mild (approximately 6 mm) of anterolisthesis of L4 upon L5.  Mild (under 25%) compression deformity involving the inferior endplate of the L1 vertebral body, grossly unchanged since the 11/2013 examination. Remaining vertebral body heights appear preserved.  There is unchanged mild DDD of T12-L1 and L1-L2 with disc space height loss, endplate irregularity and sclerosis.  Post right total hip replacement, incompletely evaluated. Atherosclerotic plaque within the abdominal aorta. Extensive vascular calcifications within the splenic artery.  Moderate colonic stool burden without evidence of obstruction.  IMPRESSION: 1. No definite acute findings. 2. Extensive postsurgical change of the lumbar spine as above.   Electronically Signed   By: Simonne Come M.D.   On: 11/03/2014 08:02   Dg Knee Complete 4 Views Left  11/03/2014   CLINICAL DATA:  Post fall, now with left knee pain.  EXAM: LEFT KNEE - COMPLETE 4+ VIEW  COMPARISON:  Left tibia and fibular radiographs- 11/05/2006  FINDINGS: Osteopenia. No displaced fracture or dislocation. No joint effusion chondrocalcinosis is noted within the medial and lateral joint spaces. There is mild degenerative change of the knee, worse within the lateral compartment with joint space loss, subchondral sclerosis and osteophytosis. Vascular calcifications. No radiopaque foreign body.  IMPRESSION: 1. No acute findings. 2. Chondrocalcinosis suggestive of CPPD. 3. Mild degenerative change, worse within the lateral compartment.    Electronically Signed   By: Simonne Come M.D.   On: 11/03/2014 07:57   Dg Hip Unilat With Pelvis 2-3 Views Left  11/03/2014   CLINICAL DATA:  Fall, left hip pain.  EXAM: LEFT HIP (WITH PELVIS) 2-3 VIEWS  COMPARISON:  None.  FINDINGS: No definite fracture or dislocation. Mild subchondral sclerosis and osteophytosis in the left hip joint. Right hip arthroplasty. Obturator rings appear intact.  IMPRESSION: 1. No definite acute fracture or dislocation. 2. Mild left hip osteoarthritis.   Electronically Signed   By: Leanna Battles M.D.   On: 11/03/2014 13:32   Dg Femur  Min 2 Views Left  11/03/2014   CLINICAL DATA:  Fall with left hip pain.  EXAM: LEFT FEMUR 2 VIEWS  COMPARISON:  11/03/2014 and 09/24/2012  FINDINGS: Exam demonstrates diffuse decreased bone mineralization. There mild degenerate changes of the hip and knee. No acute fracture or dislocation. Atherosclerotic disease of the femoral artery.  IMPRESSION: No acute findings.   Electronically Signed   By: Elberta Fortisaniel  Boyle M.D.   On: 11/03/2014 13:33     EKG Interpretation   Date/Time:  Saturday November 03 2014 06:38:37 EST Ventricular Rate:  42 PR Interval:  93 QRS Duration: 89 QT Interval:  455 QTC Calculation: 380 R Axis:   -14 Text Interpretation:  Bradycardia with irregular rate Mobitz I 2-degree AV  block (Wenckebach block) Left atrial enlargement Inferior infarct, old  Probable anteroseptal infarct, old Lateral leads are also involved mobitz  block more pronounced since last tracing Confirmed by Mirian MoGentry, Matthew  [phone]([id number]) on 11/03/2014 7:41:30 AM      MDM   Final diagnoses:  Knee pain  Left hip pain    [facility] No loss of consciousness reported by patient, patient reporting she slipped and fell. Patient noted to have knee pain on exam, patient worked up with lab work and radiographs. Patient's EKG noted to be in a second degree type I heart block, family in the room reports this is baseline for  patient, and they are not pursuing any further intervention regarding her heart block. Patient noted to be at baseline mental status reported by family, patient is alert and oriented 4 on my examination. Patient with persistent knee pain throughout her stay in the ER, despite no acute fractures noted on her radiographs, when attempting to ambulate, patient has new inability to ambulate. Patient typically ambulate throughout ALF with mild assistance from [place], now requiring full assistance to ambulate. Will admit patient for PT/OT eval, and mobility. The patient appears reasonably stabilized for admission considering the current resources, flow, and capabilities available in the ED at this time, and I doubt any other [facility] requiring further screening and/or treatment in the ED prior to admission.  BP 167/87 mmHg  Pulse 50  Temp(Src) 97.9 F (36.6 C) (Oral)  Resp 20  SpO2 99%  Signed,  Ladona MowJoe Ivery Michalski, PA-C 6:00 PM  Patient seen and discussed with Dr. Mirian MoMatthew Gentry, MD   Monte FantasiaJoseph W Yanet Balliet, PA-C 11/03/14 1800  Monte FantasiaJoseph W Eugena Rhue, PA-C 11/03/14 1800  Mirian MoMatthew Gentry, MD 11/04/14 [phone]

## 2014-11-04 ENCOUNTER — Observation Stay (HOSPITAL_COMMUNITY): Payer: Medicare Other

## 2014-11-04 DIAGNOSIS — M79662 Pain in left lower leg: Secondary | ICD-10-CM | POA: Diagnosis not present

## 2014-11-04 DIAGNOSIS — G309 Alzheimer's disease, unspecified: Secondary | ICD-10-CM | POA: Diagnosis not present

## 2014-11-04 DIAGNOSIS — E559 Vitamin D deficiency, unspecified: Secondary | ICD-10-CM | POA: Diagnosis not present

## 2014-11-04 DIAGNOSIS — R262 Difficulty in walking, not elsewhere classified: Secondary | ICD-10-CM | POA: Diagnosis not present

## 2014-11-04 DIAGNOSIS — M25552 Pain in left hip: Secondary | ICD-10-CM | POA: Diagnosis not present

## 2014-11-04 DIAGNOSIS — S76812A Strain of other specified muscles, fascia and tendons at thigh level, left thigh, initial encounter: Secondary | ICD-10-CM | POA: Diagnosis not present

## 2014-11-04 DIAGNOSIS — S79912A Unspecified injury of left hip, initial encounter: Secondary | ICD-10-CM | POA: Diagnosis not present

## 2014-11-04 DIAGNOSIS — M25562 Pain in left knee: Secondary | ICD-10-CM | POA: Diagnosis not present

## 2014-11-04 LAB — CBC
HEMATOCRIT: 39.2 % (ref 36.0–46.0)
HEMOGLOBIN: 12.9 g/dL (ref 12.0–15.0)
MCH: 28.5 pg (ref 26.0–34.0)
MCHC: 32.9 g/dL (ref 30.0–36.0)
MCV: 86.7 fL (ref 78.0–100.0)
Platelets: 233 10*3/uL (ref 150–400)
RBC: 4.52 MIL/uL (ref 3.87–5.11)
RDW: 12.9 % (ref 11.5–15.5)
WBC: 9 10*3/uL (ref 4.0–10.5)

## 2014-11-04 LAB — COMPREHENSIVE METABOLIC PANEL
ALBUMIN: 3.5 g/dL (ref 3.5–5.2)
ALK PHOS: 70 U/L (ref 39–117)
ALT: 12 U/L (ref 0–35)
ANION GAP: 8 (ref 5–15)
AST: 21 U/L (ref 0–37)
BUN: 18 mg/dL (ref 6–23)
CO2: 25 mmol/L (ref 19–32)
Calcium: 9.1 mg/dL (ref 8.4–10.5)
Chloride: 107 mmol/L (ref 96–112)
Creatinine, Ser: 0.97 mg/dL (ref 0.50–1.10)
GFR calc non Af Amer: 48 mL/min — ABNORMAL LOW (ref >90)
GFR, EST AFRICAN AMERICAN: 55 mL/min — AB (ref >90)
GLUCOSE: 97 mg/dL (ref 70–99)
Potassium: 4.2 mmol/L (ref 3.5–5.1)
Sodium: 140 mmol/L (ref 135–145)
Total Bilirubin: 1 mg/dL (ref 0.3–1.2)
Total Protein: 5.9 g/dL — ABNORMAL LOW (ref 6.0–8.3)

## 2014-11-04 LAB — GLUCOSE, CAPILLARY
GLUCOSE-CAPILLARY: 129 mg/dL — AB (ref 70–99)
Glucose-Capillary: 141 mg/dL — ABNORMAL HIGH (ref 70–99)
Glucose-Capillary: 99 mg/dL (ref 70–99)
Glucose-Capillary: 114 mg/dL — ABNORMAL HIGH (ref 70–99)

## 2014-11-04 MED ORDER — DOCUSATE SODIUM 100 MG PO CAPS
100.0000 mg | ORAL_CAPSULE | Freq: Two times a day (BID) | ORAL | Status: DC
  Administered 2014-11-04 – 2014-11-06 (×4): 100 mg via ORAL
  Filled 2014-11-04 (×5): qty 1

## 2014-11-04 MED ORDER — OXYCODONE HCL 5 MG PO TABS: 5.0000 mg | ORAL_TABLET | ORAL | Status: DC | PRN

## 2014-11-04 NOTE — Progress Notes (Signed)
Shift Event: RN states pt has living will and MOST form indicating DNR status. Nurse also confirmed DNR wishes with pt.  Code status changed to DNR  [name] [facility] [phone]

## 2014-11-04 NOTE — Evaluation (Signed)
Physical Therapy Evaluation Patient Details Name: [name] MRN: [id number] DOB: 02/07/1916 Today's Date: 11/04/2014   History of Present Illness  With a hx of dementia, copd, anxiety who presents from ALF after falling out of bed without LOC. This resulted in pain in the L knee and inability to ambulate.  Clinical Impression  Pt admitted with above diagnosis. Pt currently with functional limitations due to the deficits listed below (see PT Problem List).  Etiology of pain still undetermined.  All LLE xrays are negative.  L hip MRI negative; however, it raised concerns for possible injury at L knee/distal femur.  Pt progress will depend on type of injury and WB status that will follow.  Pt will benefit from skilled PT to increase their independence and safety with mobility to allow discharge to the venue listed below.       Follow Up Recommendations SNF;Supervision/Assistance - 24 hour    Equipment Recommendations  None recommended by PT    Recommendations for Other Services       Precautions / Restrictions Precautions Precautions: Fall Restrictions Weight Bearing Restrictions: Yes LLE Weight Bearing: Non weight bearing      Mobility  Bed Mobility Overal bed mobility: Needs Assistance Bed Mobility: Supine to Sit     Supine to sit: Mod assist     General bed mobility comments: verbal cues for sequencing  Transfers Overall transfer level: Needs assistance Equipment used: None Transfers: Stand Pivot Transfers   Stand pivot transfers: Mod assist       General transfer comment: verbal cues for sequencing and NWB LLE  Ambulation/Gait                Stairs            Wheelchair Mobility    Modified Rankin (Stroke Patients Only)       Balance                                             Pertinent Vitals/Pain Pain Assessment: 0-10 Pain Score: 7  Pain Location: Left knee Pain Intervention(s): Limited activity within  patient's tolerance;Repositioned    Home Living Family/patient expects to be discharged to:: Assisted living               Home Equipment: Walker - 2 wheels      Prior Function Level of Independence: Independent with assistive device(s)         Comments: ambulates with RW     Hand Dominance        Extremity/Trunk Assessment   Upper Extremity Assessment: Defer to OT evaluation           Lower Extremity Assessment: LLE deficits/detail         Communication   Communication: No difficulties  Cognition Arousal/Alertness: Awake/alert Behavior During Therapy: WFL for tasks assessed/performed Overall Cognitive Status: Within Functional Limits for tasks assessed                      General Comments      Exercises        Assessment/Plan    PT Assessment Patient needs continued PT services  PT Diagnosis Difficulty walking;Acute pain   PT Problem List Decreased strength;Decreased activity tolerance;Decreased balance;Decreased mobility;Pain  PT Treatment Interventions DME instruction;Gait training;Functional mobility training;Therapeutic activities;Therapeutic exercise;Patient/family education;Balance training   PT Goals (Current goals can  be found in the Care Plan section) Acute Rehab PT Goals Patient Stated Goal: return to ALF PT Goal Formulation: With patient/family Time For Goal Achievement: 11/18/14 Potential to Achieve Goals: Good    Frequency Min 3X/week   Barriers to discharge        Co-evaluation               End of Session Equipment Utilized During Treatment: Gait belt Activity Tolerance: Patient limited by pain Patient left: in chair;with family/visitor present;with call bell/phone within reach Nurse Communication: Mobility status    Functional Assessment Tool Used: clinical judgement Functional Limitation: Mobility: Walking and moving around Mobility: Walking and Moving Around Current Status [phone]): At least 60  percent but less than 80 percent impaired, limited or restricted Mobility: Walking and Moving Around Goal Status [phone]): At least 20 percent but less than 40 percent impaired, limited or restricted    Time: 6962-9528 PT Time Calculation (min) (ACUTE ONLY): 26 min   Charges:   PT Evaluation $Initial PT Evaluation Tier I: 1 Procedure PT Treatments $Therapeutic Activity: 8-22 mins   PT G Codes:   PT G-Codes **NOT FOR INPATIENT CLASS** Functional Assessment Tool Used: clinical judgement Functional Limitation: Mobility: Walking and moving around Mobility: Walking and Moving Around Current Status (U1324): At least 60 percent but less than 80 percent impaired, limited or restricted Mobility: Walking and Moving Around Goal Status [phone]): At least 20 percent but less than 40 percent impaired, limited or restricted    Ilda Foil 11/04/2014, 4:03 PM

## 2014-11-04 NOTE — Progress Notes (Signed)
Utilization Review Completed.   Kailei Cowens, RN, BSN Nurse Case Manager  

## 2014-11-04 NOTE — Evaluation (Signed)
Occupational Therapy Evaluation Patient Details Name: [name] MRN: [id number] DOB: 02/07/1916 Today's Date: 11/04/2014    History of Present Illness With a hx of dementia, copd, anxiety who presents from ALF after falling out of bed without LOC. This resulted in pain in the L knee and inability to ambulate.   Clinical Impression   PTA pt lived at ALF and required assistance for LB ADLs and bathing and used RW for functional mobility with Supervision. Pt has premorbid RUE impairment and is further limited by LLE NWB, pain, and decreased ROM. Pt requires max/total A for LB ADLs and will benefit from acute OT to address functional transfers and LB ADLs.     Follow Up Recommendations  SNF;Supervision/Assistance - 24 hour    Equipment Recommendations  None recommended by OT    Recommendations for Other Services       Precautions / Restrictions Precautions Precautions: Fall Restrictions Weight Bearing Restrictions: Yes LLE Weight Bearing: Non weight bearing      Mobility Bed Mobility Overal bed mobility: Needs Assistance Bed Mobility: Supine to Sit     Supine to sit: Mod assist     General bed mobility comments: Pt sitting in recliner when OT arrived after PT session.   Transfers Overall transfer level: Needs assistance Equipment used: None Transfers: Stand Pivot Transfers   Stand pivot transfers: Mod assist       General transfer comment: Pt declined due to just working with PT. Observed to be Mod A with PT for SPT.          ADL Overall ADL's : Needs assistance/impaired Eating/Feeding: Set up;Sitting   Grooming: Set up;Sitting   Upper Body Bathing: Set up;Sitting   Lower Body Bathing: Maximal assistance;Sit to/from stand   Upper Body Dressing : Set up;Sitting   Lower Body Dressing: Total assistance;Sit to/from stand                 General ADL Comments: Pt reports that her RUE pain and limitations make it difficult to use a RW. Pt is open  to going to a SNF at d/c.                Pertinent Vitals/Pain Pain Assessment: 0-10 Pain Score: 7  Pain Location: Left knee Pain Descriptors / Indicators: Aching;Sore;Stabbing Pain Intervention(s): Limited activity within patient's tolerance;Repositioned     Hand Dominance Right   Extremity/Trunk Assessment Upper Extremity Assessment Upper Extremity Assessment: RUE deficits/detail;Generalized weakness RUE Deficits / Details: Premorbid shoulder difficulty. Pt reports no pain at rest. Shoulder flexion ~30 degrees and reports pain shooting through UE. Pt holds guarded against body. Pt reports she is able to feed self with RUE on good days. Pt uses compensatory techniques for UB ADLs RUE: Unable to fully assess due to pain RUE Coordination: decreased gross motor   Lower Extremity Assessment Lower Extremity Assessment: Defer to PT evaluation LLE: Unable to fully assess due to pain   Cervical / Trunk Assessment Cervical / Trunk Assessment: Kyphotic   Communication Communication Communication: No difficulties   Cognition Arousal/Alertness: Awake/alert Behavior During Therapy: WFL for tasks assessed/performed Overall Cognitive Status: Within Functional Limits for tasks assessed                                Home Living Family/patient expects to be discharged to:: Skilled nursing facility  Home Equipment: Walker - 2 wheels          Prior Functioning/Environment Level of Independence: Needs assistance  Gait / Transfers Assistance Needed: pt ambulates with RW and Supervision. She has assistance for shower transfers.  ADL's / Homemaking Assistance Needed: Pt needs assistance for LB dressing and bathing. She has help to shower 2x per week but sponge bathes on her own every morning. Pt is independent with UB ADLs despite premorbid shoulder problem.    Comments: ambulates with RW    OT Diagnosis: Generalized weakness;Acute  pain   OT Problem List: Decreased strength;Decreased range of motion;Decreased activity tolerance;Impaired balance (sitting and/or standing);Pain;Impaired UE functional use   OT Treatment/Interventions: Self-care/ADL training;Therapeutic exercise;Energy conservation;DME and/or AE instruction;Therapeutic activities;Patient/family education;Balance training    OT Goals(Current goals can be found in the care plan section) Acute Rehab OT Goals Patient Stated Goal: return to ALF OT Goal Formulation: With patient Time For Goal Achievement: 11/18/14 Potential to Achieve Goals: Good ADL Goals Pt Will Perform Lower Body Bathing: with min assist;sit to/from stand Pt Will Perform Lower Body Dressing: with min assist;sit to/from stand Pt Will Transfer to Toilet: with min assist;stand pivot transfer;bedside commode  OT Frequency: Min 1X/week    End of Session  Activity Tolerance: Patient limited by pain Patient left: in chair;with call bell/phone within reach;with family/visitor present   Time: 1434-1450 OT Time Calculation (min): 16 min Charges:  OT General Charges $OT Visit: 1 Procedure OT Evaluation $Initial OT Evaluation Tier I: 1 Procedure G-Codes: OT G-codes **NOT FOR INPATIENT CLASS** Functional Assessment Tool Used: clinical judgment Functional Limitation: Self care Self Care Current Status (Z6109): At least 60 percent but less than 80 percent impaired, limited or restricted Self Care Goal Status (U0454): At least 40 percent but less than 60 percent impaired, limited or restricted  [name] 11/04/2014, 4:44 PM  Carney Living, OTR/L Occupational Therapist [phone] (pager)

## 2014-11-04 NOTE — Progress Notes (Signed)
Recommendations: MRI of left femur c/w medial muscle strain, but no apparent skeletal injury. No surgical indications. May now WBAT and D/C when appropriate plan in place.   No orthopaedic surgery f/u required.   Averie Hornbaker A. 11/04/2014, 11:03 PM

## 2014-11-04 NOTE — Consult Note (Signed)
ORTHOPAEDIC CONSULTATION HISTORY & PHYSICAL REQUESTING PHYSICIAN: ED  Chief Complaint: left knee/hip pain  HPI: [name] is a 81 y.o. female who is a patient of my partner's, Dr. Jerl Santos, who presented to the ED following a fall from bed with pain/difficulty bearing weight on L LE.  She is s/p R THA.  Although xrays of the left hip/femur/knee were without evidence for fracture, WB was difficult enough that she was admitted for therapy and pain control.  Past Medical History  Diagnosis Date  . COPD (chronic obstructive pulmonary disease)   . Hypertension   . Oral aphthae   . Orthostatic hypotension   . Senile osteoporosis   . Atrophy of vulva   . Unspecified urinary incontinence   . Urinary frequency   . Unspecified vitamin D deficiency   . Anxiety state, unspecified   . Restless legs syndrome (RLS)   . Unspecified disorder of kidney and ureter   . Atrioventricular block, unspecified   . Closed dislocation of shoulder, unspecified site   . Anemia, unspecified   . Altered mental status   . Lumbago   . Transient disorder of initiating or maintaining sleep   . Cellulitis and abscess of unspecified site   . Sebaceous cyst   . Insomnia, unspecified   . Dementia in conditions classified elsewhere without behavioral disturbance   . Depressive disorder, not elsewhere classified   . Abnormality of gait   . Adult failure to thrive   . Headache(784.0)   . Type II or unspecified type diabetes mellitus without mention of complication, not stated as uncontrolled   . Gout, unspecified   . Extrinsic asthma, unspecified   . Chronic kidney disease, stage I   . Osteoarthrosis, unspecified whether generalized or localized, unspecified site   . Spinal stenosis, unspecified region other than cervical   . Edema   . Tachycardia, unspecified    Past Surgical History  Procedure Laterality Date  . Hip surgery      (R) hip replacement  . Cesarean section    . Abdominal hysterectomy        partial  . Back surgery      fusion  . Breast surgery      bilateral breast reduction  . Eye surgery      bilateral cataract  . Basal cell carcinoma excision    . Basal cell carcinoma excision      face  . Hip closed reduction Right 08/07/2013    Procedure: ATTEMPTED CLOSED MANIPULATION HIP;  Surgeon: Jodi Marble, MD;  Location: [facility] OR;  Service: Orthopedics;  Laterality: Right;  . Total hip revision Right 08/08/2013    Procedure: TOTAL HIP REVISION- right;  Surgeon: Velna Ochs, MD;  Location: [facility] OR;  Service: Orthopedics;  Laterality: Right;   History   Social History  . Marital Status: Widowed    Spouse Name: N/A    Number of Children: 4  . Years of Education: N/A   Social History Main Topics  . Smoking status: Never Smoker   . Smokeless tobacco: Never Used  . Alcohol Use: No  . Drug Use: No  . Sexual Activity: None   Other Topics Concern  . None   Social History Narrative   Family History  Problem Relation Age of Onset  . Diabetes Mother   . Diabetes Father   . Stroke Sister   . Heart attack Brother    Allergies  Allergen Reactions  . Sulfa Antibiotics Shortness Of Breath  Worsens asthma  . Clonidine Derivatives   . Lactose Intolerance (Gi)   . Verapamil     [facility]   Prior to Admission medications   Medication Sig Start Date End Date Taking? Authorizing Provider  acetaminophen (TYLENOL) 325 MG tablet Take 650 mg by mouth every 6 (six) hours as needed for mild pain. Every 8 hours as needed for pain   Yes Historical Provider, MD  amLODipine (NORVASC) 10 MG tablet Take 1 tablet (10 mg total) by mouth daily. 11/02/13  Yes Richardean Canal, MD  clonazePAM Scarlette Calico) 1 MG tablet Take one tablet by mouth at bedtime for anxiety 09/19/14  Yes Monica Johnson, DO  furosemide (LASIX) 20 MG tablet Take 1 tablet (20 mg total) by mouth every other day. For chf 04/30/14  Yes Tiffany L Reed, DO  HYDROcodone-acetaminophen (NORCO/VICODIN) 5-325 MG per  tablet Take one tablet by mouth twice daily for pain 10/25/14  Yes Sharon Seller, NP  lisinopril (PRINIVIL,ZESTRIL) 20 MG tablet Take 1 tablet (20 mg total) by mouth daily. Patient taking differently: Take 20 mg by mouth at bedtime.  11/02/13  Yes Richardean Canal, MD  mirtazapine (REMERON) 7.5 MG tablet Take 1 tablet (7.5 mg total) by mouth at bedtime. 04/09/14  Yes Tiffany L Reed, DO  Polyethyl Glycol-Propyl Glycol (SYSTANE) 0.4-0.3 % SOLN Instill one drop into each eye two times daily to relieve dry,itchy eye. 02/01/14  Yes Sharon Seller, NP  potassium chloride SA (K-DUR,KLOR-CON) 20 MEQ tablet Take 1 tablet (20 mEq total) by mouth every other day. For chf 04/30/14  Yes Tiffany L Reed, DO  senna-docusate (SENOKOT-S) 8.6-50 MG per tablet Take 1 tablet by mouth daily.   Yes Historical Provider, MD  triamcinolone cream (KENALOG) 0.5 % Apply 1 application topically 2 (two) times daily. To rash on legs 04/30/14  Yes Tiffany L Reed, DO  capsicum oleoresin (TRIXAICIN) 0.025 % cream Apply to right shoulder and upper arm three times daily Patient taking differently: Apply 1 application topically 3 (three) times daily as needed (Pain).  07/30/14   Tiffany L Reed, DO   Dg Lumbar Spine Complete  11/03/2014   CLINICAL DATA:  Post fall, now at back pain  EXAM: LUMBAR SPINE - COMPLETE 4+ VIEW  COMPARISON:  11/26/2013  FINDINGS: Osteopenia. Examination is further degraded secondary to patient positioning.  Post L2-L3 paraspinal fusion. No definite evidence of hardware failure loosening. Laminectomy defects are noted within the lower lumbar spine extending from L2 through L5. There is unchanged osseous fusion involving the posterior elements of L2 through S1.  Unchanged mild scoliotic curvature of the thoracolumbar spine with dominant caudal component convex to the right. Unchanged mild (approximately 6 mm) of anterolisthesis of L4 upon L5.  Mild (under 25%) compression deformity involving the inferior endplate of the L1  vertebral body, grossly unchanged since the 11/2013 examination. Remaining vertebral body heights appear preserved.  There is unchanged mild DDD of T12-L1 and L1-L2 with disc space height loss, endplate irregularity and sclerosis.  Post right total hip replacement, incompletely evaluated. Atherosclerotic plaque within the abdominal aorta. Extensive vascular calcifications within the splenic artery.  Moderate colonic stool burden without evidence of obstruction.  IMPRESSION: 1. No definite acute findings. 2. Extensive postsurgical change of the lumbar spine as above.   Electronically Signed   By: Simonne Come M.D.   On: 11/03/2014 08:02   Dg Knee Complete 4 Views Left  11/03/2014   CLINICAL DATA:  Post fall, now with left  knee pain.  EXAM: LEFT KNEE - COMPLETE 4+ VIEW  COMPARISON:  Left tibia and fibular radiographs- 11/05/2006  FINDINGS: Osteopenia. No displaced fracture or dislocation. No joint effusion chondrocalcinosis is noted within the medial and lateral joint spaces. There is mild degenerative change of the knee, worse within the lateral compartment with joint space loss, subchondral sclerosis and osteophytosis. Vascular calcifications. No radiopaque foreign body.  IMPRESSION: 1. No acute findings. 2. Chondrocalcinosis suggestive of CPPD. 3. Mild degenerative change, worse within the lateral compartment.   Electronically Signed   By: Simonne ComeJohn  Watts M.D.   On: 11/03/2014 07:57   Dg Hip Unilat With Pelvis 2-3 Views Left  11/03/2014   CLINICAL DATA:  Fall, left hip pain.  EXAM: LEFT HIP (WITH PELVIS) 2-3 VIEWS  COMPARISON:  None.  FINDINGS: No definite fracture or dislocation. Mild subchondral sclerosis and osteophytosis in the left hip joint. Right hip arthroplasty. Obturator rings appear intact.  IMPRESSION: 1. No definite acute fracture or dislocation. 2. Mild left hip osteoarthritis.   Electronically Signed   By: Leanna BattlesMelinda  Blietz M.D.   On: 11/03/2014 13:32   Dg Femur Min 2 Views Left  11/03/2014   CLINICAL  DATA:  Fall with left hip pain.  EXAM: LEFT FEMUR 2 VIEWS  COMPARISON:  11/03/2014 and 09/24/2012  FINDINGS: Exam demonstrates diffuse decreased bone mineralization. There mild degenerate changes of the hip and knee. No acute fracture or dislocation. Atherosclerotic disease of the femoral artery.  IMPRESSION: No acute findings.   Electronically Signed   By: Elberta Fortisaniel  Boyle M.D.   On: 11/03/2014 13:33    Positive ROS: All other systems have been reviewed and were otherwise negative with the exception of those mentioned in the HPI and as above.  Physical Exam: Vitals: Refer to EMR. Constitutional:  WD, WN, NAD HEENT:  NCAT, EOMI Neuro/Psych:  Alert & oriented to person, place, and time; appropriate mood & affect Lymphatic: No generalized extremity edema or lymphadenopathy Extremities / MSK:  The extremities are normal with respect to appearance, ranges of motion, joint stability, muscle strength/tone, sensation, & perfusion except as otherwise noted:  Although there is pain with left knee palpation, it is much less than with passive left hip IR/ER.  Pain in "knee" with heel slap.  Cannot maintain SLR on left, but can on right.  She is quite painful with firm palpation on left greater trochanter  Assessment: L LE pain, with significant localization to the hip.  Xrays have been negative.  Recommendations: I discussed my concerns with her and I have made her NWB on L LE until ND fx is ruled out.  I have ordered L hip MRI to try to discern this given my clinical suspicion for hip pathology.  Cliffton Astersavid A. Janee Mornhompson, MD      Orthopaedic & Hand Surgery [facility] [address] [place], [place]  [id number] Office: [phone] Mobile: [phone]

## 2014-11-04 NOTE — Progress Notes (Addendum)
[facility] PROGRESS NOTE  [name] WUJ:[id number] DOB: 23-Apr-1916 DOA: 11/03/2014 PCP: Bufford Spikes, DO  Assessment/Plan:  Principal Problem:   Inability to ambulate due to knee pain: from ALF. PT/OT recommending SNF. Will consult S.W. [name] called ortho who ordered MRI hip. Shows edema. No fracture Active Problems:   Alzheimer's disease   Essential hypertension controlled on lisinopril, amlodipine   Vitamin D deficiency   Code Status:  DNR Family Communication:  [name] at bedside Disposition Plan:  SNF when bed found  HPI/Subjective: No new complaints  Objective: Filed Vitals:   11/04/14 2041  BP: 133/41  Pulse: 52  Temp: 98.2 F (36.8 C)  Resp: 18    Intake/Output Summary (Last 24 hours) at 11/04/14 2109 Last data filed at 11/04/14 1700  Gross per 24 hour  Intake    360 ml  Output      0 ml  Net    360 ml   There were no vitals filed for this visit.  Exam:   General:  In chair. comfortable  Cardiovascular: RRR without MGR  Respiratory: CTA without WRR  Abdomen: S, NT ND  Ext: no CCE  Basic Metabolic Panel:  Recent Labs Lab 11/03/14 0842 11/04/14 0500  NA 141 140  K 4.4 4.2  CL 107 107  CO2 24 25  GLUCOSE 89 97  BUN 26* 18  CREATININE 1.00 0.97  CALCIUM 9.2 9.1   Liver Function Tests:  Recent Labs Lab 11/03/14 0842 11/04/14 0500  AST 22 21  ALT 15 12  ALKPHOS 74 70  BILITOT 0.5 1.0  PROT 6.6 5.9*  ALBUMIN 3.8 3.5   No results for input(s): LIPASE, AMYLASE in the last 168 hours. No results for input(s): AMMONIA in the last 168 hours. CBC:  Recent Labs Lab 11/03/14 0842 11/04/14 0500  WBC 8.3 9.0  NEUTROABS 5.6  --   HGB 12.1 12.9  HCT 36.5 39.2  MCV 86.3 86.7  PLT 222 233   Cardiac Enzymes: No results for input(s): CKTOTAL, CKMB, CKMBINDEX, TROPONINI in the last 168 hours. BNP (last 3 results) No results for input(s): BNP in the last 8760 hours.  ProBNP (last 3 results) No results for input(s):  PROBNP in the last 8760 hours.  CBG:  Recent Labs Lab 11/03/14 2251 11/04/14 1009 11/04/14 1323 11/04/14 1652  GLUCAP 87 141* 99 129*    No results found for this or any previous visit (from the past 240 hour(s)).   Studies: Dg Lumbar Spine Complete  11/03/2014   CLINICAL DATA:  Post fall, now at back pain  EXAM: LUMBAR SPINE - COMPLETE 4+ VIEW  COMPARISON:  11/26/2013  FINDINGS: Osteopenia. Examination is further degraded secondary to patient positioning.  Post L2-L3 paraspinal fusion. No definite evidence of hardware failure loosening. Laminectomy defects are noted within the lower lumbar spine extending from L2 through L5. There is unchanged osseous fusion involving the posterior elements of L2 through S1.  Unchanged mild scoliotic curvature of the thoracolumbar spine with dominant caudal component convex to the right. Unchanged mild (approximately 6 mm) of anterolisthesis of L4 upon L5.  Mild (under 25%) compression deformity involving the inferior endplate of the L1 vertebral body, grossly unchanged since the 11/2013 examination. Remaining vertebral body heights appear preserved.  There is unchanged mild DDD of T12-L1 and L1-L2 with disc space height loss, endplate irregularity and sclerosis.  Post right total hip replacement, incompletely evaluated. Atherosclerotic plaque within the abdominal aorta. Extensive vascular calcifications within the splenic artery.  Moderate colonic stool burden without evidence of obstruction.  IMPRESSION: 1. No definite acute findings. 2. Extensive postsurgical change of the lumbar spine as above.   Electronically Signed   By: Simonne Come M.D.   On: 11/03/2014 08:02   Mr Femur Left Wo Contrast  11/04/2014   CLINICAL DATA:  Left lower extremity pain. Abnormal MRI of the left hip demonstrating edema in the distal left thigh.  EXAM: MR OF THE LEFT FEMUR WITHOUT CONTRAST  TECHNIQUE: Multiplanar, multisequence MR imaging was performed. No intravenous contrast was  administered.  COMPARISON:  MRI of the left hip dated 11/04/2014  FINDINGS: There is an extensive intramuscular tear of the vastus intermedius muscle. This tear extends from 15 cm below the top of the femoral head to the knee. There is a prominent knee effusion. There is edema in the subcutaneous fat at the lateral aspect of the distal femur. The distal quadriceps tendon is intact.  There is no acute abnormality of the femur or of the proximal tibia or patella.  There is evidence of a remote injury to the posterior superior aspect of the medial femoral condyle of the distal left femur.  IMPRESSION: Unusual extensive intramuscular tear of the vastus intermedius muscle of the mid and distal left thigh. Secondary knee joint effusion. No acute osseous abnormality.   Electronically Signed   By: Geanie Cooley M.D.   On: 11/04/2014 19:36   Mr Hip Left Wo Contrast  11/04/2014   CLINICAL DATA:  Left hip pain after a fall from bed. Difficulty bearing weight in the left lower extremity.  EXAM: MR OF THE LEFT HIP WITHOUT CONTRAST  TECHNIQUE: Multiplanar, multisequence MR imaging was performed. No intravenous contrast was administered.  COMPARISON:  Radiographs dated 11/03/2016  FINDINGS: Bones: There are no fractures or bone contusions in the pelvis or hips. Bone detail is obscured around the right hip because of the hip prosthesis.  Articular cartilage and labrum  Articular cartilage:  Normal.  Labrum:  Intact.  Joint or bursal effusion  Joint effusion:  No joint effusion.  Bursae: Normal. Slight degenerative changes of the left greater trochanter.  Muscles and tendons  Muscles and tendons: There is abnormal edema around the mid left femoral shaft extending into the adjacent posterior musculature. I am concerned that the patient may have a distal fracture or muscle tear is not visible on this exam.  Atrophy of the gluteal muscles bilaterally, more on the left than the right.  Other findings  Miscellaneous: 3.1 cm simple  appearing cyst on the left ovary. No adenopathy or mass lesions.  IMPRESSION: 1. Abnormal edema in the soft tissues surrounding the mid femoral shaft extending into the adjacent musculature. I am concerned the patient may have an occult fracture or muscle tear in the distal left thigh or at the knee. 2. No acute abnormality of the left hip or pelvis.   Electronically Signed   By: Geanie Cooley M.D.   On: 11/04/2014 12:49   Dg Knee Complete 4 Views Left  11/03/2014   CLINICAL DATA:  Post fall, now with left knee pain.  EXAM: LEFT KNEE - COMPLETE 4+ VIEW  COMPARISON:  Left tibia and fibular radiographs- 11/05/2006  FINDINGS: Osteopenia. No displaced fracture or dislocation. No joint effusion chondrocalcinosis is noted within the medial and lateral joint spaces. There is mild degenerative change of the knee, worse within the lateral compartment with joint space loss, subchondral sclerosis and osteophytosis. Vascular calcifications. No radiopaque foreign body.  IMPRESSION: 1.  No acute findings. 2. Chondrocalcinosis suggestive of CPPD. 3. Mild degenerative change, worse within the lateral compartment.   Electronically Signed   By: Simonne ComeJohn  Watts M.D.   On: 11/03/2014 07:57   Dg Hip Unilat With Pelvis 2-3 Views Left  11/03/2014   CLINICAL DATA:  Fall, left hip pain.  EXAM: LEFT HIP (WITH PELVIS) 2-3 VIEWS  COMPARISON:  None.  FINDINGS: No definite fracture or dislocation. Mild subchondral sclerosis and osteophytosis in the left hip joint. Right hip arthroplasty. Obturator rings appear intact.  IMPRESSION: 1. No definite acute fracture or dislocation. 2. Mild left hip osteoarthritis.   Electronically Signed   By: Leanna BattlesMelinda  Blietz M.D.   On: 11/03/2014 13:32   Dg Femur Min 2 Views Left  11/03/2014   CLINICAL DATA:  Fall with left hip pain.  EXAM: LEFT FEMUR 2 VIEWS  COMPARISON:  11/03/2014 and 09/24/2012  FINDINGS: Exam demonstrates diffuse decreased bone mineralization. There mild degenerate changes of the hip and knee.  No acute fracture or dislocation. Atherosclerotic disease of the femoral artery.  IMPRESSION: No acute findings.   Electronically Signed   By: Elberta Fortisaniel  Boyle M.D.   On: 11/03/2014 13:33    Scheduled Meds: . amLODipine  10 mg Oral Daily  . furosemide  20 mg Oral QODAY  . heparin  5,000 Units Subcutaneous 3 times per day  . insulin aspart  0-15 Units Subcutaneous TID WC  . insulin aspart  0-5 Units Subcutaneous QHS  . lisinopril  20 mg Oral Daily  . mirtazapine  7.5 mg Oral QHS   Continuous Infusions:   Time spent: 25 minutes  Karon Cotterill L  [facility]  [email or web address], password [email or web address] 11/04/2014, 9:09 PM  LOS: 1 day

## 2014-11-05 DIAGNOSIS — G309 Alzheimer's disease, unspecified: Secondary | ICD-10-CM | POA: Diagnosis not present

## 2014-11-05 DIAGNOSIS — E559 Vitamin D deficiency, unspecified: Secondary | ICD-10-CM | POA: Diagnosis not present

## 2014-11-05 DIAGNOSIS — I10 Essential (primary) hypertension: Secondary | ICD-10-CM | POA: Diagnosis not present

## 2014-11-05 DIAGNOSIS — R262 Difficulty in walking, not elsewhere classified: Secondary | ICD-10-CM | POA: Diagnosis not present

## 2014-11-05 DIAGNOSIS — M25562 Pain in left knee: Secondary | ICD-10-CM | POA: Diagnosis not present

## 2014-11-05 DIAGNOSIS — M79609 Pain in unspecified limb: Secondary | ICD-10-CM

## 2014-11-05 DIAGNOSIS — T148 Other injury of unspecified body region: Secondary | ICD-10-CM | POA: Diagnosis not present

## 2014-11-05 LAB — GLUCOSE, CAPILLARY: Glucose-Capillary: 79 mg/dL (ref 70–99)

## 2014-11-05 NOTE — Progress Notes (Signed)
UR completed 

## 2014-11-05 NOTE — Clinical Social Work Placement (Addendum)
Clinical Social Work Department CLINICAL SOCIAL WORK PLACEMENT NOTE 11/05/2014  Patient:  [name],[name] A  Account Number:  [id number] Admit date:  11/03/2014  Clinical Social Worker:  Mosie EpsteinEMILY S VAUGHN, LCSWA  Date/time:  11/05/2014 04:26 PM  Clinical Social Work is seeking post-discharge placement for this patient at the following level of care:   SKILLED NURSING   (*CSW will update this form in Epic as items are completed)   11/05/2014  Patient/family provided with [facility] of Clinical Social Work's list of facilities offering this level of care within the geographic area requested by the patient (or if unable, by the patient's family).  11/05/2014  Patient/family informed of their freedom to choose among providers that offer the needed level of care, that participate in Medicare, Medicaid or managed care program needed by the patient, have an available bed and are willing to accept the patient.  11/05/2014  Patient/family informed of [facility]' ownership interest in [facility], as well as of the fact that they are under no obligation to receive care at this facility.  PASARR submitted to EDS on  PASARR number received on   FL2 transmitted to all facilities in geographic area requested by pt/family on  11/05/2014 FL2 transmitted to all facilities within larger geographic area on   Patient informed that his/her managed care company has contracts with or will negotiate with  certain facilities, including the following:     Patient/family informed of bed offers received:  [facility]06/2014 [facility] Physician recommends and patient chooses bed at  n/a  [facility]   Patient to be transferred to facility by [facility] Patient and family notified of transfer on 11/06/2014 Name of family member notified:  Zella BallRobin, daughter  The following physician request were entered in Epic:   Additional Comments: PASARR  previously existing.  Marcelline Deistmily Vaughn, LCSWA [phone]) Licensed Clinical Social Worker Orthopedics [phone]([id number]) and Surgical [phone]([id number])

## 2014-11-05 NOTE — Progress Notes (Signed)
VASCULAR LAB PRELIMINARY  PRELIMINARY  PRELIMINARY  PRELIMINARY  Left lower extremity venous duplex completed.    Preliminary report:  Left:  No evidence of DVT, superficial thrombosis, or Baker's cyst.  Skarlett Sedlacek, RVT 11/05/2014, 10:20 AM

## 2014-11-05 NOTE — Progress Notes (Signed)
Physical Therapy Treatment Patient Details Name: [name] MRN: [id number] DOB: 02/07/1916 Today's Date: 11/05/2014    History of Present Illness With a hx of dementia, copd, anxiety who presents from ALF after falling out of bed without LOC. This resulted in pain in the L knee and inability to ambulate.    PT Comments    Pt now L LE WBAT per ortho note. Pt tolerated amb well this date with minimal L Knee pain. Pt c/o of R shoulder and L cervical neck pain. Pt point tender over C3/C4 L facet joints. Pt remains at increased falls risk and con't to benefit from SNF upon d/c to achieve safe mod I level of function to return to ALF.   Follow Up Recommendations  SNF;Supervision/Assistance - 24 hour     Equipment Recommendations  None recommended by PT    Recommendations for Other Services       Precautions / Restrictions Precautions Precautions: Fall Restrictions Weight Bearing Restrictions: No LLE Weight Bearing: Weight bearing as tolerated (cleared by ortho)    Mobility  Bed Mobility Overal bed mobility: Needs Assistance Bed Mobility: Supine to Sit     Supine to sit: Mod assist     General bed mobility comments: assit for trunk elevation  Transfers Overall transfer level: Needs assistance Equipment used: Rolling walker (2 wheeled) Transfers: Sit to/from Stand   Stand pivot transfers: Min assist       General transfer comment: assist for stability during transition of hands from bed to RW  Ambulation/Gait Ambulation/Gait assistance: Min assist Ambulation Distance (Feet): 60 Feet Assistive device: Rolling walker (2 wheeled) Gait Pattern/deviations: Step-through pattern;Decreased stride length Gait velocity: slow   General Gait Details: pt with antaligic pattern but more due to R UE pain and neck pain, mina for walker management   Stairs            Wheelchair Mobility    Modified Rankin (Stroke Patients Only)       Balance Overall balance  assessment: Needs assistance         Standing balance support: No upper extremity supported Standing balance-Leahy Scale: Poor Standing balance comment: pt stood at sink to wash hands and leaned up against sink for stability                    Cognition Arousal/Alertness: Awake/alert Behavior During Therapy: WFL for tasks assessed/performed Overall Cognitive Status: Within Functional Limits for tasks assessed                      Exercises      General Comments General comments (skin integrity, edema, etc.): pt assisted to bathroom, supervision for hygiene s/p tolieting      Pertinent Vitals/Pain Pain Assessment: 0-10 Pain Score: 8  Pain Location: cervical and R shoulder Pain Intervention(s): Limited activity within patient's tolerance    Home Living                      Prior Function            PT Goals (current goals can now be found in the care plan section) Acute Rehab PT Goals Patient Stated Goal: returnt o ALF Progress towards PT goals: Progressing toward goals    Frequency  Min 3X/week    PT Plan Current plan remains appropriate    Co-evaluation             End of Session Equipment Utilized During Treatment:  Gait belt Activity Tolerance: Patient limited by pain Patient left: in chair;with family/visitor present;with call bell/phone within reach     Time: 1125-1200 PT Time Calculation (min) (ACUTE ONLY): 35 min  Charges:  $Gait Training: 23-37 mins                    G Codes:      Marcene Brawn 11/05/2014, 2:15 PM   Lewis Shock, PT, DPT Pager #: [phone] Office #: [phone]

## 2014-11-05 NOTE — Progress Notes (Signed)
[facility] PROGRESS NOTE  [name] ZOX:[id number] DOB: 1915/11/25 DOA: 11/03/2014 PCP: Bufford Spikes, DO  Assessment/Plan:   Muscle strain with effusion: from ALF. PT/OT recommending SNF.  -Daughter called ortho who ordered MRI hip. Shows edema. No fracture- WBAT - very tender in calf- will get duplex to r/o DVT    Alzheimer's disease   Essential hypertension controlled on lisinopril, amlodipine   Vitamin D deficiency   Code Status:  DNR Family Communication:  LM for daughter Disposition Plan:  SNF in AM  HPI/Subjective: No new complaints  Objective: Filed Vitals:   11/05/14 0522  BP: 167/53  Pulse: 56  Temp: 97.8 F (36.6 C)  Resp: 18    Intake/Output Summary (Last 24 hours) at 11/05/14 0958 Last data filed at 11/05/14 0730  Gross per 24 hour  Intake    720 ml  Output      0 ml  Net    720 ml   There were no vitals filed for this visit.  Exam:   General:  NAD  Cardiovascular: RRR without MGR  Respiratory: CTA without WRR  Abdomen: S, NT ND  Ext: no CCE  Basic Metabolic Panel:  Recent Labs Lab 11/03/14 0842 11/04/14 0500  NA 141 140  K 4.4 4.2  CL 107 107  CO2 24 25  GLUCOSE 89 97  BUN 26* 18  CREATININE 1.00 0.97  CALCIUM 9.2 9.1   Liver Function Tests:  Recent Labs Lab 11/03/14 0842 11/04/14 0500  AST 22 21  ALT 15 12  ALKPHOS 74 70  BILITOT 0.5 1.0  PROT 6.6 5.9*  ALBUMIN 3.8 3.5   No results for input(s): LIPASE, AMYLASE in the last 168 hours. No results for input(s): AMMONIA in the last 168 hours. CBC:  Recent Labs Lab 11/03/14 0842 11/04/14 0500  WBC 8.3 9.0  NEUTROABS 5.6  --   HGB 12.1 12.9  HCT 36.5 39.2  MCV 86.3 86.7  PLT 222 233   Cardiac Enzymes: No results for input(s): CKTOTAL, CKMB, CKMBINDEX, TROPONINI in the last 168 hours. BNP (last 3 results) No results for input(s): BNP in the last 8760 hours.  ProBNP (last 3 results) No results for input(s): PROBNP in the last 8760  hours.  CBG:  Recent Labs Lab 11/04/14 1009 11/04/14 1323 11/04/14 1652 11/04/14 2314 11/05/14 0643  GLUCAP 141* 99 129* 114* 79    No results found for this or any previous visit (from the past 240 hour(s)).   Studies: Mr Femur Left Wo Contrast  11/04/2014   CLINICAL DATA:  Left lower extremity pain. Abnormal MRI of the left hip demonstrating edema in the distal left thigh.  EXAM: MR OF THE LEFT FEMUR WITHOUT CONTRAST  TECHNIQUE: Multiplanar, multisequence MR imaging was performed. No intravenous contrast was administered.  COMPARISON:  MRI of the left hip dated 11/04/2014  FINDINGS: There is an extensive intramuscular tear of the vastus intermedius muscle. This tear extends from 15 cm below the top of the femoral head to the knee. There is a prominent knee effusion. There is edema in the subcutaneous fat at the lateral aspect of the distal femur. The distal quadriceps tendon is intact.  There is no acute abnormality of the femur or of the proximal tibia or patella.  There is evidence of a remote injury to the posterior superior aspect of the medial femoral condyle of the distal left femur.  IMPRESSION: Unusual extensive intramuscular tear of the vastus intermedius muscle of the mid and distal  left thigh. Secondary knee joint effusion. No acute osseous abnormality.   Electronically Signed   By: Geanie CooleyJim  Maxwell M.D.   On: 11/04/2014 19:36   Mr Hip Left Wo Contrast  11/04/2014   CLINICAL DATA:  Left hip pain after a fall from bed. Difficulty bearing weight in the left lower extremity.  EXAM: MR OF THE LEFT HIP WITHOUT CONTRAST  TECHNIQUE: Multiplanar, multisequence MR imaging was performed. No intravenous contrast was administered.  COMPARISON:  Radiographs dated 11/03/2016  FINDINGS: Bones: There are no fractures or bone contusions in the pelvis or hips. Bone detail is obscured around the right hip because of the hip prosthesis.  Articular cartilage and labrum  Articular cartilage:  Normal.  Labrum:   Intact.  Joint or bursal effusion  Joint effusion:  No joint effusion.  Bursae: Normal. Slight degenerative changes of the left greater trochanter.  Muscles and tendons  Muscles and tendons: There is abnormal edema around the mid left femoral shaft extending into the adjacent posterior musculature. I am concerned that the patient may have a distal fracture or muscle tear is not visible on this exam.  Atrophy of the gluteal muscles bilaterally, more on the left than the right.  Other findings  Miscellaneous: 3.1 cm simple appearing cyst on the left ovary. No adenopathy or mass lesions.  IMPRESSION: 1. Abnormal edema in the soft tissues surrounding the mid femoral shaft extending into the adjacent musculature. I am concerned the patient may have an occult fracture or muscle tear in the distal left thigh or at the knee. 2. No acute abnormality of the left hip or pelvis.   Electronically Signed   By: Geanie CooleyJim  Maxwell M.D.   On: 11/04/2014 12:49   Dg Hip Unilat With Pelvis 2-3 Views Left  11/03/2014   CLINICAL DATA:  Fall, left hip pain.  EXAM: LEFT HIP (WITH PELVIS) 2-3 VIEWS  COMPARISON:  None.  FINDINGS: No definite fracture or dislocation. Mild subchondral sclerosis and osteophytosis in the left hip joint. Right hip arthroplasty. Obturator rings appear intact.  IMPRESSION: 1. No definite acute fracture or dislocation. 2. Mild left hip osteoarthritis.   Electronically Signed   By: Leanna BattlesMelinda  Blietz M.D.   On: 11/03/2014 13:32   Dg Femur Min 2 Views Left  11/03/2014   CLINICAL DATA:  Fall with left hip pain.  EXAM: LEFT FEMUR 2 VIEWS  COMPARISON:  11/03/2014 and 09/24/2012  FINDINGS: Exam demonstrates diffuse decreased bone mineralization. There mild degenerate changes of the hip and knee. No acute fracture or dislocation. Atherosclerotic disease of the femoral artery.  IMPRESSION: No acute findings.   Electronically Signed   By: Elberta Fortisaniel  Boyle M.D.   On: 11/03/2014 13:33    Scheduled Meds: . amLODipine  10 mg Oral  Daily  . docusate sodium  100 mg Oral BID  . furosemide  20 mg Oral QODAY  . heparin  5,000 Units Subcutaneous 3 times per day  . insulin aspart  0-15 Units Subcutaneous TID WC  . insulin aspart  0-5 Units Subcutaneous QHS  . lisinopril  20 mg Oral Daily  . mirtazapine  7.5 mg Oral QHS   Continuous Infusions:   Time spent: 25 minutes  Haitham Dolinsky  [facility]  [email or web address], password [email or web address] 11/05/2014, 9:58 AM  LOS: 2 days

## 2014-11-05 NOTE — Progress Notes (Signed)
Called about Venous Dopplers that was ordered earlier today techs have left

## 2014-11-05 NOTE — Progress Notes (Signed)
Orthopedic Tech Progress Note Patient Details:  [name] 02/07/1916 [id number] Applied knee sleeve to LLE.  Pulses, sensation, motion intact before and after application.  Capillary refill less than 2 seconds before and after application. Ortho Devices Type of Ortho Device: Knee Sleeve Ortho Device/Splint Location: LLE Ortho Device/Splint Interventions: Application   Lesle ChrisGilliland, Ailey Wessling L 11/05/2014, 2:42 PM

## 2014-11-05 NOTE — Clinical Social Work Psychosocial (Signed)
Clinical Social Work Department BRIEF PSYCHOSOCIAL ASSESSMENT 11/05/2014  Patient:  [name], [name]     Account Number:  [id number]     Admit date:  11/03/2014  Clinical Social Worker:  Delrae Sawyers  Date/Time:  11/05/2014 04:21 PM  Referred by:  Physician  Date Referred:  11/05/2014 Referred for  SNF Placement   Other Referral:   none.   Interview type:  Patient Other interview type:   none.    PSYCHOSOCIAL DATA Living Status:  FACILITY Admitted from facility:  Other Level of care:  Assisted Living Primary support name:  none given. Primary support relationship to patient:  NONE Degree of support available:   Patient admitted from [facility]F. Patient states patient would prefer to return at time of discharge.    CURRENT CONCERNS Current Concerns  Post-Acute Placement   Other Concerns:   none.    SOCIAL WORK ASSESSMENT / PLAN CSW received referral for possible SNF placement at time of discharge. CSW met with patient at bedside to discuss discharge disposition. Patient informed CSW patient resides at [facility], and wishes to return once medically stable for discharge. Patient expressed understanding in PT/OT recommendation for SNF at time of discharge, and stated [name] will not allow patient to return until short-term rehabilitation completed. Patient presents tearful at bedside and expressed worry and anxiety about not returning "home" at time of discharge. CSW offered support to patient regarding patient's concerns.    CSW to continue to follow and assist with discharge planning needs.   Assessment/plan status:  Psychosocial Support/Ongoing Assessment of Needs Other assessment/ plan:   none.   Information/referral to community resources:   [facility] bed offers.    PATIENT'S/FAMILY'S RESPONSE TO PLAN OF CARE: Patient understanding and agreeable to CSW plan of care. Patient expressed no further questions or concerns at this time.        Lubertha Sayres, [place] [phone]) Licensed Clinical Social Worker Orthopedics [phone]) and Surgical [phone])

## 2014-11-06 ENCOUNTER — Encounter (HOSPITAL_COMMUNITY): Payer: Self-pay | Admitting: Internal Medicine

## 2014-11-06 DIAGNOSIS — Z9181 History of falling: Secondary | ICD-10-CM | POA: Diagnosis not present

## 2014-11-06 DIAGNOSIS — J449 Chronic obstructive pulmonary disease, unspecified: Secondary | ICD-10-CM | POA: Diagnosis not present

## 2014-11-06 DIAGNOSIS — M25552 Pain in left hip: Secondary | ICD-10-CM | POA: Diagnosis not present

## 2014-11-06 DIAGNOSIS — I129 Hypertensive chronic kidney disease with stage 1 through stage 4 chronic kidney disease, or unspecified chronic kidney disease: Secondary | ICD-10-CM | POA: Diagnosis not present

## 2014-11-06 DIAGNOSIS — T148 Other injury of unspecified body region: Secondary | ICD-10-CM

## 2014-11-06 DIAGNOSIS — R627 Adult failure to thrive: Secondary | ICD-10-CM | POA: Diagnosis not present

## 2014-11-06 DIAGNOSIS — F0280 Dementia in other diseases classified elsewhere without behavioral disturbance: Secondary | ICD-10-CM | POA: Diagnosis not present

## 2014-11-06 DIAGNOSIS — S76822A Laceration of other specified muscles, fascia and tendons at thigh level, left thigh, initial encounter: Secondary | ICD-10-CM | POA: Diagnosis not present

## 2014-11-06 DIAGNOSIS — J45909 Unspecified asthma, uncomplicated: Secondary | ICD-10-CM | POA: Diagnosis not present

## 2014-11-06 DIAGNOSIS — E119 Type 2 diabetes mellitus without complications: Secondary | ICD-10-CM | POA: Diagnosis not present

## 2014-11-06 DIAGNOSIS — R259 Unspecified abnormal involuntary movements: Secondary | ICD-10-CM | POA: Diagnosis not present

## 2014-11-06 DIAGNOSIS — Z79899 Other long term (current) drug therapy: Secondary | ICD-10-CM | POA: Diagnosis not present

## 2014-11-06 DIAGNOSIS — G309 Alzheimer's disease, unspecified: Secondary | ICD-10-CM | POA: Diagnosis not present

## 2014-11-06 DIAGNOSIS — F329 Major depressive disorder, single episode, unspecified: Secondary | ICD-10-CM | POA: Diagnosis not present

## 2014-11-06 DIAGNOSIS — E559 Vitamin D deficiency, unspecified: Secondary | ICD-10-CM | POA: Diagnosis not present

## 2014-11-06 DIAGNOSIS — R262 Difficulty in walking, not elsewhere classified: Secondary | ICD-10-CM | POA: Diagnosis not present

## 2014-11-06 DIAGNOSIS — I10 Essential (primary) hypertension: Secondary | ICD-10-CM | POA: Diagnosis not present

## 2014-11-06 DIAGNOSIS — M25569 Pain in unspecified knee: Secondary | ICD-10-CM | POA: Diagnosis not present

## 2014-11-06 DIAGNOSIS — M6281 Muscle weakness (generalized): Secondary | ICD-10-CM | POA: Diagnosis not present

## 2014-11-06 DIAGNOSIS — M25562 Pain in left knee: Secondary | ICD-10-CM | POA: Diagnosis not present

## 2014-11-06 DIAGNOSIS — F419 Anxiety disorder, unspecified: Secondary | ICD-10-CM | POA: Diagnosis not present

## 2014-11-06 DIAGNOSIS — Z91011 Allergy to milk products: Secondary | ICD-10-CM | POA: Diagnosis not present

## 2014-11-06 DIAGNOSIS — N181 Chronic kidney disease, stage 1: Secondary | ICD-10-CM | POA: Diagnosis not present

## 2014-11-06 DIAGNOSIS — G2581 Restless legs syndrome: Secondary | ICD-10-CM | POA: Diagnosis not present

## 2014-11-06 DIAGNOSIS — M1612 Unilateral primary osteoarthritis, left hip: Secondary | ICD-10-CM | POA: Diagnosis not present

## 2014-11-06 DIAGNOSIS — M810 Age-related osteoporosis without current pathological fracture: Secondary | ICD-10-CM | POA: Diagnosis not present

## 2014-11-06 DIAGNOSIS — M1990 Unspecified osteoarthritis, unspecified site: Secondary | ICD-10-CM | POA: Diagnosis not present

## 2014-11-06 DIAGNOSIS — Z888 Allergy status to other drugs, medicaments and biological substances status: Secondary | ICD-10-CM | POA: Diagnosis not present

## 2014-11-06 DIAGNOSIS — Z882 Allergy status to sulfonamides status: Secondary | ICD-10-CM | POA: Diagnosis not present

## 2014-11-06 LAB — GLUCOSE, CAPILLARY
GLUCOSE-CAPILLARY: 151 mg/dL — AB (ref 70–99)
GLUCOSE-CAPILLARY: 77 mg/dL (ref 70–99)

## 2014-11-06 MED ORDER — DOCUSATE SODIUM 100 MG PO CAPS: 100.0000 mg | ORAL_CAPSULE | Freq: Two times a day (BID) | ORAL | Status: DC

## 2014-11-06 MED ORDER — CLONAZEPAM 1 MG PO TABS: ORAL_TABLET | ORAL | Status: DC

## 2014-11-06 MED ORDER — HYDROCODONE-ACETAMINOPHEN 5-325 MG PO TABS: 1.0000 | ORAL_TABLET | Freq: Two times a day (BID) | ORAL | Status: DC | PRN

## 2014-11-06 NOTE — Progress Notes (Signed)
Patient d/c to SNF.  IV removed, report called to SNF.

## 2014-11-06 NOTE — Discharge Planning (Signed)
Patient will discharge today per MD order. [facility] to call report prior to transportation to [phone] [facility]: [facility]  CSW sent discharge summary to SNF for review.  Packet is complete.  RN, patient and family aware of discharge plans.  Vickii PennaGina Alanie Syler, LCSWA [phone]  Psychiatric & Orthopedics (5N 1-16) Clinical Social Worker

## 2014-11-06 NOTE — Clinical Social Work Note (Addendum)
1:35pm- CSW received a call from daughter. CSW reviewed bed offers with daughter. Daughter chose [facility] where patient has been in the past.  CSW contacted [facility] to confirm bed availability today and admission today.  CSW instructed daughter to contact SNF to arrange to complete paperwork.  Daughter agreeable.      12:05pm- CSW attempted to contact daughter to review bed offers.  CSW left message and is awaiting a return call.  Disposition is SNF, though facility unknown at this time.  Vickii PennaGina Dearia [place], LCSWA [phone]  Psychiatric & Orthopedics (5N 1-16) Clinical Social Worker

## 2014-11-06 NOTE — Progress Notes (Signed)
Physical Therapy Treatment Patient Details Name: [name] MRN: [id number] DOB: 02/07/1916 Today's Date: 11/06/2014    History of Present Illness Pt is a 81 yo female with a hx of dementia, copd, anxiety who presents from ALF after falling out of bed without LOC. This resulted in pain in the L knee and inability to ambulate.    PT Comments    Pt with improved ambulation tolerance this date and functioning at supervision level. However pt with history of falls and limited ability to use R UE functional to complete ADLs. Pt strongly desires to return to ALF however unclear if ALF would provide adequate supervision/assist. Recommend OT consult to assess safety with ADLs. Pt may still need ST-SNF if doesn't perform well for OT, however if pt able to complete ADLs pt may be able to go to ALF.    Follow Up Recommendations  SNF;Supervision/Assistance - 24 hour     Equipment Recommendations  None recommended by PT    Recommendations for Other Services OT consult     Precautions / Restrictions Precautions Precautions: Fall Restrictions Weight Bearing Restrictions: No LLE Weight Bearing: Weight bearing as tolerated    Mobility  Bed Mobility               General bed mobility comments: pt up in chair  Transfers Overall transfer level: Needs assistance Equipment used: Rolling walker (2 wheeled) Transfers: Sit to/from Stand Sit to Stand: Supervision         General transfer comment: increased time but good technique  Ambulation/Gait Ambulation/Gait assistance: Supervision Ambulation Distance (Feet): 220 Feet Assistive device: Rolling walker (2 wheeled) Gait Pattern/deviations: Step-through pattern;Shuffle;Decreased stride length     General Gait Details: pt with increased trunk flexion however typical for age. pt with no c/o L LE pain. pt c/o cervical pain. Pt with no episodes of LOB.   Stairs            Wheelchair Mobility    Modified Rankin (Stroke  Patients Only)       Balance Overall balance assessment: Needs assistance         Standing balance support: Bilateral upper extremity supported Standing balance-Leahy Scale: Poor                      Cognition Arousal/Alertness: Awake/alert Behavior During Therapy: WFL for tasks assessed/performed Overall Cognitive Status: Within Functional Limits for tasks assessed                      Exercises      General Comments General comments (skin integrity, edema, etc.): spoke extensively with patient regarding d/c plans. Pt reports that at ALF someone assists pt with donning shoes and socks, baths her wed/sat, and provided all meals.  Pt desires to return to ALF but is unable to complete some ADLs indep that she was before..Pt has had a few falls in the last year.      Pertinent Vitals/Pain Pain Assessment: 0-10 Pain Score: 6  Pain Location: cervcial pain Pain Descriptors / Indicators: Aching Pain Intervention(s): Limited activity within patient's tolerance    Home Living                      Prior Function            PT Goals (current goals can now be found in the care plan section) Acute Rehab PT Goals Patient Stated Goal: return to ALF Progress towards PT  goals: Progressing toward goals    Frequency  Min 3X/week    PT Plan Current plan remains appropriate    Co-evaluation             End of Session Equipment Utilized During Treatment: Gait belt Activity Tolerance: Patient tolerated treatment well Patient left: in chair;with family/visitor present;with call bell/phone within reach     Time: 0915-0943 PT Time Calculation (min) (ACUTE ONLY): 28 min  Charges:  $Gait Training: 8-22 mins $Self Care/Home Management: 05-26-2023                    G Codes:      Marcene Brawn 11/06/2014, 10:48 AM   Lewis Shock, PT, DPT Pager #: [phone] Office #: [phone]

## 2014-11-06 NOTE — Discharge Summary (Signed)
Physician Discharge Summary  [name] ZOX:[id number] DOB: 12-06-1915 DOA: 11/03/2014  PCP: Bufford Spikes, DO  Admit date: 11/03/2014 Discharge date: 11/06/2014  Time spent: 35 minutes  Recommendations for Outpatient Follow-up:  1. Knee sleeve  Discharge Diagnoses:  Principal Problem:   Inability to ambulate due to knee Active Problems:   Alzheimer's disease   Essential hypertension   Vitamin D deficiency   Discharge Condition: improved  Diet recommendation: cardiac  There were no vitals filed for this visit.  History of present illness:  [name] is a 81 y.o. female  With a hx of dementia, copd, anxiety who presents from ALF after falling out of bed without LOC. This resulted in pain in the L knee. Pt was then brought to the ED where imaging was neg for acute fracture. The patient was initially planned for d/c from ED, however family was not satisfied with discharge with concerns of poor mobility s/p fall. RN attempted to ambulate patient and per staff, pt required near full assist in ED. Hospitalist was consulted for consideration for admission.  Hospital Course:  Muscle strain with effusion: from ALF. PT/OT recommending SNF.  -Daughter called ortho who ordered MRI hip. Shows edema. No fracture- WBAT -duplex for DVT negative   Alzheimer's disease  Essential hypertension controlled on lisinopril, amlodipine  Vitamin D deficiency  Procedures:  duplex  Consultations:  ortho  Discharge Exam: Filed Vitals:   11/06/14 0603  BP: 156/59  Pulse: 42  Temp: 97 F (36.1 C)  Resp: 16    General: A+Ox3, NAD Cardiovascular: rrr Respiratory: clear  Discharge Instructions   Discharge Instructions    Diet - low sodium heart healthy    Complete by:  As directed      Discharge instructions    Complete by:  As directed   Wear knee sleeve     Increase activity slowly    Complete by:  As directed           Current Discharge Medication List    START  taking these medications   Details  docusate sodium (COLACE) 100 MG capsule Take 1 capsule (100 mg total) by mouth 2 (two) times daily. Qty: 10 capsule, Refills: 0      CONTINUE these medications which have CHANGED   Details  clonazePAM (KLONOPIN) 1 MG tablet Take one tablet by mouth at bedtime for anxiety Qty: 10 tablet, Refills: 0    HYDROcodone-acetaminophen (NORCO/VICODIN) 5-325 MG per tablet Take 1-2 tablets by mouth 2 (two) times daily as needed for moderate pain. Qty: 30 tablet, Refills: 0      CONTINUE these medications which have NOT CHANGED   Details  acetaminophen (TYLENOL) 325 MG tablet Take 650 mg by mouth every 6 (six) hours as needed for mild pain. Every 8 hours as needed for pain    amLODipine (NORVASC) 10 MG tablet Take 1 tablet (10 mg total) by mouth daily. Qty: 30 tablet, Refills: 0    furosemide (LASIX) 20 MG tablet Take 1 tablet (20 mg total) by mouth every other day. For chf Qty: 15 tablet, Refills: 3    lisinopril (PRINIVIL,ZESTRIL) 20 MG tablet Take 1 tablet (20 mg total) by mouth daily. Qty: 30 tablet, Refills: 0    mirtazapine (REMERON) 7.5 MG tablet Take 1 tablet (7.5 mg total) by mouth at bedtime. Qty: 30 tablet, Refills: 3    Polyethyl Glycol-Propyl Glycol (SYSTANE) 0.4-0.3 % SOLN Instill one drop into each eye two times daily to relieve dry,itchy eye.  Qty: 30 mL, Refills: 5    potassium chloride SA (K-DUR,KLOR-CON) 20 MEQ tablet Take 1 tablet (20 mEq total) by mouth every other day. For chf Qty: 15 tablet, Refills: 3    senna-docusate (SENOKOT-S) 8.6-50 MG per tablet Take 1 tablet by mouth daily.    triamcinolone cream (KENALOG) 0.5 % Apply 1 application topically 2 (two) times daily. To rash on legs Qty: 454 g, Refills: 3    capsicum oleoresin (TRIXAICIN) 0.025 % cream Apply to right shoulder and upper arm three times daily Qty: 56.6 g, Refills: 0       Allergies  Allergen Reactions  . Sulfa Antibiotics Shortness Of Breath    Worsens  asthma  . Clonidine Derivatives   . Lactose Intolerance (Gi)   . Verapamil     [facility] MAR   Follow-up Information    Follow up with REED, TIFFANY, DO In 1 week.   Specialty:  Geriatric Medicine   Contact information:   [address] [place] [address] [phone]        The results of significant diagnostics from this hospitalization (including imaging, microbiology, ancillary and laboratory) are listed below for reference.    Significant Diagnostic Studies: Dg Lumbar Spine Complete  11/03/2014   CLINICAL DATA:  Post fall, now at back pain  EXAM: LUMBAR SPINE - COMPLETE 4+ VIEW  COMPARISON:  11/26/2013  FINDINGS: Osteopenia. Examination is further degraded secondary to patient positioning.  Post L2-L3 paraspinal fusion. No definite evidence of hardware failure loosening. Laminectomy defects are noted within the lower lumbar spine extending from L2 through L5. There is unchanged osseous fusion involving the posterior elements of L2 through S1.  Unchanged mild scoliotic curvature of the thoracolumbar spine with dominant caudal component convex to the right. Unchanged mild (approximately 6 mm) of anterolisthesis of L4 upon L5.  Mild (under 25%) compression deformity involving the inferior endplate of the L1 vertebral body, grossly unchanged since the 11/2013 examination. Remaining vertebral body heights appear preserved.  There is unchanged mild DDD of T12-L1 and L1-L2 with disc space height loss, endplate irregularity and sclerosis.  Post right total hip replacement, incompletely evaluated. Atherosclerotic plaque within the abdominal aorta. Extensive vascular calcifications within the splenic artery.  Moderate colonic stool burden without evidence of obstruction.  IMPRESSION: 1. No definite acute findings. 2. Extensive postsurgical change of the lumbar spine as above.   Electronically Signed   By: Simonne Come M.D.   On: 11/03/2014 08:02   Mr Femur Left Wo Contrast  11/04/2014   CLINICAL DATA:   Left lower extremity pain. Abnormal MRI of the left hip demonstrating edema in the distal left thigh.  EXAM: MR OF THE LEFT FEMUR WITHOUT CONTRAST  TECHNIQUE: Multiplanar, multisequence MR imaging was performed. No intravenous contrast was administered.  COMPARISON:  MRI of the left hip dated 11/04/2014  FINDINGS: There is an extensive intramuscular tear of the vastus intermedius muscle. This tear extends from 15 cm below the top of the femoral head to the knee. There is a prominent knee effusion. There is edema in the subcutaneous fat at the lateral aspect of the distal femur. The distal quadriceps tendon is intact.  There is no acute abnormality of the femur or of the proximal tibia or patella.  There is evidence of a remote injury to the posterior superior aspect of the medial femoral condyle of the distal left femur.  IMPRESSION: Unusual extensive intramuscular tear of the vastus intermedius muscle of the mid and distal left  thigh. Secondary knee joint effusion. No acute osseous abnormality.   Electronically Signed   By: Geanie CooleyJim  Maxwell M.D.   On: 11/04/2014 19:36   Mr Hip Left Wo Contrast  11/04/2014   CLINICAL DATA:  Left hip pain after a fall from bed. Difficulty bearing weight in the left lower extremity.  EXAM: MR OF THE LEFT HIP WITHOUT CONTRAST  TECHNIQUE: Multiplanar, multisequence MR imaging was performed. No intravenous contrast was administered.  COMPARISON:  Radiographs dated 11/03/2016  FINDINGS: Bones: There are no fractures or bone contusions in the pelvis or hips. Bone detail is obscured around the right hip because of the hip prosthesis.  Articular cartilage and labrum  Articular cartilage:  Normal.  Labrum:  Intact.  Joint or bursal effusion  Joint effusion:  No joint effusion.  Bursae: Normal. Slight degenerative changes of the left greater trochanter.  Muscles and tendons  Muscles and tendons: There is abnormal edema around the mid left femoral shaft extending into the adjacent posterior  musculature. I am concerned that the patient may have a distal fracture or muscle tear is not visible on this exam.  Atrophy of the gluteal muscles bilaterally, more on the left than the right.  Other findings  Miscellaneous: 3.1 cm simple appearing cyst on the left ovary. No adenopathy or mass lesions.  IMPRESSION: 1. Abnormal edema in the soft tissues surrounding the mid femoral shaft extending into the adjacent musculature. I am concerned the patient may have an occult fracture or muscle tear in the distal left thigh or at the knee. 2. No acute abnormality of the left hip or pelvis.   Electronically Signed   By: Geanie CooleyJim  Maxwell M.D.   On: 11/04/2014 12:49   Dg Knee Complete 4 Views Left  11/03/2014   CLINICAL DATA:  Post fall, now with left knee pain.  EXAM: LEFT KNEE - COMPLETE 4+ VIEW  COMPARISON:  Left tibia and fibular radiographs- 11/05/2006  FINDINGS: Osteopenia. No displaced fracture or dislocation. No joint effusion chondrocalcinosis is noted within the medial and lateral joint spaces. There is mild degenerative change of the knee, worse within the lateral compartment with joint space loss, subchondral sclerosis and osteophytosis. Vascular calcifications. No radiopaque foreign body.  IMPRESSION: 1. No acute findings. 2. Chondrocalcinosis suggestive of CPPD. 3. Mild degenerative change, worse within the lateral compartment.   Electronically Signed   By: Simonne ComeJohn  Watts M.D.   On: 11/03/2014 07:57   Dg Hip Unilat With Pelvis 2-3 Views Left  11/03/2014   CLINICAL DATA:  Fall, left hip pain.  EXAM: LEFT HIP (WITH PELVIS) 2-3 VIEWS  COMPARISON:  None.  FINDINGS: No definite fracture or dislocation. Mild subchondral sclerosis and osteophytosis in the left hip joint. Right hip arthroplasty. Obturator rings appear intact.  IMPRESSION: 1. No definite acute fracture or dislocation. 2. Mild left hip osteoarthritis.   Electronically Signed   By: Leanna BattlesMelinda  Blietz M.D.   On: 11/03/2014 13:32   Dg Femur Min 2 Views  Left  11/03/2014   CLINICAL DATA:  Fall with left hip pain.  EXAM: LEFT FEMUR 2 VIEWS  COMPARISON:  11/03/2014 and 09/24/2012  FINDINGS: Exam demonstrates diffuse decreased bone mineralization. There mild degenerate changes of the hip and knee. No acute fracture or dislocation. Atherosclerotic disease of the femoral artery.  IMPRESSION: No acute findings.   Electronically Signed   By: Elberta Fortisaniel  Boyle M.D.   On: 11/03/2014 13:33    Microbiology: No results found for this or any previous visit (from the  past 240 hour(s)).   Labs: Basic Metabolic Panel:  Recent Labs Lab 11/03/14 0842 11/04/14 0500  NA 141 140  K 4.4 4.2  CL 107 107  CO2 24 25  GLUCOSE 89 97  BUN 26* 18  CREATININE 1.00 0.97  CALCIUM 9.2 9.1   Liver Function Tests:  Recent Labs Lab 11/03/14 0842 11/04/14 0500  AST 22 21  ALT 15 12  ALKPHOS 74 70  BILITOT 0.5 1.0  PROT 6.6 5.9*  ALBUMIN 3.8 3.5   No results for input(s): LIPASE, AMYLASE in the last 168 hours. No results for input(s): AMMONIA in the last 168 hours. CBC:  Recent Labs Lab 11/03/14 0842 11/04/14 0500  WBC 8.3 9.0  NEUTROABS 5.6  --   HGB 12.1 12.9  HCT 36.5 39.2  MCV 86.3 86.7  PLT 222 233   Cardiac Enzymes: No results for input(s): CKTOTAL, CKMB, CKMBINDEX, TROPONINI in the last 168 hours. BNP: BNP (last 3 results) No results for input(s): BNP in the last 8760 hours.  ProBNP (last 3 results) No results for input(s): PROBNP in the last 8760 hours.  CBG:  Recent Labs Lab 11/04/14 1009 11/04/14 1323 11/04/14 1652 11/04/14 2314 11/05/14 0643  GLUCAP 141* 99 129* 114* 79       Signed:  Frona Yost  [facility] 11/06/2014, 9:50 AM

## 2014-11-06 NOTE — Progress Notes (Signed)
UR completed 

## 2014-11-06 NOTE — Clinical Social Work Placement (Cosign Needed)
Clinical Social Work Department CLINICAL SOCIAL WORK PLACEMENT NOTE 11/06/2014  Patient:  [name],[name]  Account Number:  [id number] Admit date:  11/03/2014  Clinical Social Worker:  Hortencia PilarKIERRA Micaila Ziemba, CLINICAL SOCIAL WORKER  Date/time:  11/06/2014 01:56 PM  Clinical Social Work is seeking post-discharge placement for this patient at the following level of care:   SKILLED NURSING   (*CSW will update this form in Epic as items are completed)   11/06/2014  Patient/family provided with [facility] Department of Clinical Social Work's list of facilities offering this level of care within the geographic area requested by the patient (or if unable, by the patient's family).  11/06/2014  Patient/family informed of their freedom to choose among providers that offer the needed level of care, that participate in Medicare, Medicaid or managed care program needed by the patient, have an available bed and are willing to accept the patient.  11/06/2014  Patient/family informed of [facility]' ownership interest in [facility], as well as of the fact that they are under no obligation to receive care at this facility.  PASARR submitted to EDS on 11/06/2014 PASARR number received on 11/06/2014  FL2 transmitted to all facilities in geographic area requested by pt/family on  11/06/2014 FL2 transmitted to all facilities within larger geographic area on   Patient informed that his/her managed care company has contracts with or will negotiate with  certain facilities, including the following:     Patient/family informed of bed offers received:  11/06/2014 [facility] Physician recommends and patient chooses bed at    [facility] on  11/07/2014 Patient to be transferred to facility by [facility] Patient and family notified of transfer on 11/07/2014 Name of family member notified:  Helen Hashimotoobin Sauve  The  following physician request were entered in Epic:   Additional Comments:   Kashton Mcartor S. Nussen Pullin, BSW-Intern

## 2014-11-15 ENCOUNTER — Telehealth: Payer: Self-pay | Admitting: *Deleted

## 2014-11-15 NOTE — Telephone Encounter (Signed)
Terrie with [facility] called and stated that patient is coming back to the facility from the hospital and needs verbal orders to resume care and recert. Verbal orders given and Terrie will call when patient is back in the facility and schedule a follow up hospital appointment. Agreed.

## 2014-11-23 ENCOUNTER — Telehealth: Payer: Self-pay | Admitting: *Deleted

## 2014-11-23 DIAGNOSIS — Z4800 Encounter for change or removal of nonsurgical wound dressing: Secondary | ICD-10-CM | POA: Diagnosis not present

## 2014-11-23 DIAGNOSIS — F0280 Dementia in other diseases classified elsewhere without behavioral disturbance: Secondary | ICD-10-CM | POA: Diagnosis not present

## 2014-11-23 DIAGNOSIS — M810 Age-related osteoporosis without current pathological fracture: Secondary | ICD-10-CM | POA: Diagnosis not present

## 2014-11-23 DIAGNOSIS — M6281 Muscle weakness (generalized): Secondary | ICD-10-CM | POA: Diagnosis not present

## 2014-11-23 DIAGNOSIS — M1991 Primary osteoarthritis, unspecified site: Secondary | ICD-10-CM | POA: Diagnosis not present

## 2014-11-23 DIAGNOSIS — J45909 Unspecified asthma, uncomplicated: Secondary | ICD-10-CM | POA: Diagnosis not present

## 2014-11-23 DIAGNOSIS — G309 Alzheimer's disease, unspecified: Secondary | ICD-10-CM | POA: Diagnosis not present

## 2014-11-23 DIAGNOSIS — I129 Hypertensive chronic kidney disease with stage 1 through stage 4 chronic kidney disease, or unspecified chronic kidney disease: Secondary | ICD-10-CM | POA: Diagnosis not present

## 2014-11-23 DIAGNOSIS — E119 Type 2 diabetes mellitus without complications: Secondary | ICD-10-CM | POA: Diagnosis not present

## 2014-11-23 DIAGNOSIS — R260 Ataxic gait: Secondary | ICD-10-CM | POA: Diagnosis not present

## 2014-11-23 DIAGNOSIS — S76822D Laceration of other specified muscles, fascia and tendons at thigh level, left thigh, subsequent encounter: Secondary | ICD-10-CM | POA: Diagnosis not present

## 2014-11-23 DIAGNOSIS — M4800 Spinal stenosis, site unspecified: Secondary | ICD-10-CM | POA: Diagnosis not present

## 2014-11-23 DIAGNOSIS — J449 Chronic obstructive pulmonary disease, unspecified: Secondary | ICD-10-CM | POA: Diagnosis not present

## 2014-11-23 DIAGNOSIS — Z9181 History of falling: Secondary | ICD-10-CM | POA: Diagnosis not present

## 2014-11-23 DIAGNOSIS — F419 Anxiety disorder, unspecified: Secondary | ICD-10-CM | POA: Diagnosis not present

## 2014-11-23 DIAGNOSIS — S81811D Laceration without foreign body, right lower leg, subsequent encounter: Secondary | ICD-10-CM | POA: Diagnosis not present

## 2014-11-23 DIAGNOSIS — N181 Chronic kidney disease, stage 1: Secondary | ICD-10-CM | POA: Diagnosis not present

## 2014-11-23 DIAGNOSIS — Z96641 Presence of right artificial hip joint: Secondary | ICD-10-CM | POA: Diagnosis not present

## 2014-11-23 NOTE — Telephone Encounter (Signed)
Carrey with [facility] called needing verbal orders for Wound Care. Patient has a right leg skin tear. Verbal orders given. Also informed her that patient needs appointment. She stated that she will get with patient and call back and schedule an appointment.

## 2014-11-27 DIAGNOSIS — J449 Chronic obstructive pulmonary disease, unspecified: Secondary | ICD-10-CM | POA: Diagnosis not present

## 2014-11-27 DIAGNOSIS — J45909 Unspecified asthma, uncomplicated: Secondary | ICD-10-CM | POA: Diagnosis not present

## 2014-11-27 DIAGNOSIS — F0280 Dementia in other diseases classified elsewhere without behavioral disturbance: Secondary | ICD-10-CM | POA: Diagnosis not present

## 2014-11-27 DIAGNOSIS — M1991 Primary osteoarthritis, unspecified site: Secondary | ICD-10-CM | POA: Diagnosis not present

## 2014-11-27 DIAGNOSIS — F419 Anxiety disorder, unspecified: Secondary | ICD-10-CM | POA: Diagnosis not present

## 2014-11-27 DIAGNOSIS — N181 Chronic kidney disease, stage 1: Secondary | ICD-10-CM | POA: Diagnosis not present

## 2014-11-27 DIAGNOSIS — S81811D Laceration without foreign body, right lower leg, subsequent encounter: Secondary | ICD-10-CM | POA: Diagnosis not present

## 2014-11-27 DIAGNOSIS — M810 Age-related osteoporosis without current pathological fracture: Secondary | ICD-10-CM | POA: Diagnosis not present

## 2014-11-27 DIAGNOSIS — Z4800 Encounter for change or removal of nonsurgical wound dressing: Secondary | ICD-10-CM | POA: Diagnosis not present

## 2014-11-27 DIAGNOSIS — Z96641 Presence of right artificial hip joint: Secondary | ICD-10-CM | POA: Diagnosis not present

## 2014-11-27 DIAGNOSIS — R260 Ataxic gait: Secondary | ICD-10-CM | POA: Diagnosis not present

## 2014-11-27 DIAGNOSIS — M4800 Spinal stenosis, site unspecified: Secondary | ICD-10-CM | POA: Diagnosis not present

## 2014-11-27 DIAGNOSIS — G309 Alzheimer's disease, unspecified: Secondary | ICD-10-CM | POA: Diagnosis not present

## 2014-11-27 DIAGNOSIS — Z9181 History of falling: Secondary | ICD-10-CM | POA: Diagnosis not present

## 2014-11-27 DIAGNOSIS — M6281 Muscle weakness (generalized): Secondary | ICD-10-CM | POA: Diagnosis not present

## 2014-11-27 DIAGNOSIS — I129 Hypertensive chronic kidney disease with stage 1 through stage 4 chronic kidney disease, or unspecified chronic kidney disease: Secondary | ICD-10-CM | POA: Diagnosis not present

## 2014-11-27 DIAGNOSIS — E119 Type 2 diabetes mellitus without complications: Secondary | ICD-10-CM | POA: Diagnosis not present

## 2014-11-27 DIAGNOSIS — S76822D Laceration of other specified muscles, fascia and tendons at thigh level, left thigh, subsequent encounter: Secondary | ICD-10-CM | POA: Diagnosis not present

## 2014-11-30 DIAGNOSIS — M6281 Muscle weakness (generalized): Secondary | ICD-10-CM | POA: Diagnosis not present

## 2014-11-30 DIAGNOSIS — F419 Anxiety disorder, unspecified: Secondary | ICD-10-CM | POA: Diagnosis not present

## 2014-11-30 DIAGNOSIS — S76822D Laceration of other specified muscles, fascia and tendons at thigh level, left thigh, subsequent encounter: Secondary | ICD-10-CM | POA: Diagnosis not present

## 2014-11-30 DIAGNOSIS — J449 Chronic obstructive pulmonary disease, unspecified: Secondary | ICD-10-CM | POA: Diagnosis not present

## 2014-11-30 DIAGNOSIS — Z4800 Encounter for change or removal of nonsurgical wound dressing: Secondary | ICD-10-CM | POA: Diagnosis not present

## 2014-11-30 DIAGNOSIS — R260 Ataxic gait: Secondary | ICD-10-CM | POA: Diagnosis not present

## 2014-11-30 DIAGNOSIS — G309 Alzheimer's disease, unspecified: Secondary | ICD-10-CM | POA: Diagnosis not present

## 2014-11-30 DIAGNOSIS — M810 Age-related osteoporosis without current pathological fracture: Secondary | ICD-10-CM | POA: Diagnosis not present

## 2014-11-30 DIAGNOSIS — I129 Hypertensive chronic kidney disease with stage 1 through stage 4 chronic kidney disease, or unspecified chronic kidney disease: Secondary | ICD-10-CM | POA: Diagnosis not present

## 2014-11-30 DIAGNOSIS — M1991 Primary osteoarthritis, unspecified site: Secondary | ICD-10-CM | POA: Diagnosis not present

## 2014-11-30 DIAGNOSIS — N181 Chronic kidney disease, stage 1: Secondary | ICD-10-CM | POA: Diagnosis not present

## 2014-11-30 DIAGNOSIS — Z9181 History of falling: Secondary | ICD-10-CM | POA: Diagnosis not present

## 2014-11-30 DIAGNOSIS — F0280 Dementia in other diseases classified elsewhere without behavioral disturbance: Secondary | ICD-10-CM | POA: Diagnosis not present

## 2014-11-30 DIAGNOSIS — M4800 Spinal stenosis, site unspecified: Secondary | ICD-10-CM | POA: Diagnosis not present

## 2014-11-30 DIAGNOSIS — S81811D Laceration without foreign body, right lower leg, subsequent encounter: Secondary | ICD-10-CM | POA: Diagnosis not present

## 2014-11-30 DIAGNOSIS — E119 Type 2 diabetes mellitus without complications: Secondary | ICD-10-CM | POA: Diagnosis not present

## 2014-11-30 DIAGNOSIS — Z96641 Presence of right artificial hip joint: Secondary | ICD-10-CM | POA: Diagnosis not present

## 2014-11-30 DIAGNOSIS — J45909 Unspecified asthma, uncomplicated: Secondary | ICD-10-CM | POA: Diagnosis not present

## 2014-12-04 DIAGNOSIS — R260 Ataxic gait: Secondary | ICD-10-CM | POA: Diagnosis not present

## 2014-12-04 DIAGNOSIS — F419 Anxiety disorder, unspecified: Secondary | ICD-10-CM | POA: Diagnosis not present

## 2014-12-04 DIAGNOSIS — M810 Age-related osteoporosis without current pathological fracture: Secondary | ICD-10-CM | POA: Diagnosis not present

## 2014-12-04 DIAGNOSIS — S76822D Laceration of other specified muscles, fascia and tendons at thigh level, left thigh, subsequent encounter: Secondary | ICD-10-CM | POA: Diagnosis not present

## 2014-12-04 DIAGNOSIS — M1991 Primary osteoarthritis, unspecified site: Secondary | ICD-10-CM | POA: Diagnosis not present

## 2014-12-04 DIAGNOSIS — M6281 Muscle weakness (generalized): Secondary | ICD-10-CM | POA: Diagnosis not present

## 2014-12-04 DIAGNOSIS — Z9181 History of falling: Secondary | ICD-10-CM | POA: Diagnosis not present

## 2014-12-04 DIAGNOSIS — E119 Type 2 diabetes mellitus without complications: Secondary | ICD-10-CM | POA: Diagnosis not present

## 2014-12-04 DIAGNOSIS — N181 Chronic kidney disease, stage 1: Secondary | ICD-10-CM | POA: Diagnosis not present

## 2014-12-04 DIAGNOSIS — F0280 Dementia in other diseases classified elsewhere without behavioral disturbance: Secondary | ICD-10-CM | POA: Diagnosis not present

## 2014-12-04 DIAGNOSIS — S81811D Laceration without foreign body, right lower leg, subsequent encounter: Secondary | ICD-10-CM | POA: Diagnosis not present

## 2014-12-04 DIAGNOSIS — M4800 Spinal stenosis, site unspecified: Secondary | ICD-10-CM | POA: Diagnosis not present

## 2014-12-04 DIAGNOSIS — J45909 Unspecified asthma, uncomplicated: Secondary | ICD-10-CM | POA: Diagnosis not present

## 2014-12-04 DIAGNOSIS — Z4800 Encounter for change or removal of nonsurgical wound dressing: Secondary | ICD-10-CM | POA: Diagnosis not present

## 2014-12-04 DIAGNOSIS — I129 Hypertensive chronic kidney disease with stage 1 through stage 4 chronic kidney disease, or unspecified chronic kidney disease: Secondary | ICD-10-CM | POA: Diagnosis not present

## 2014-12-04 DIAGNOSIS — Z96641 Presence of right artificial hip joint: Secondary | ICD-10-CM | POA: Diagnosis not present

## 2014-12-04 DIAGNOSIS — G309 Alzheimer's disease, unspecified: Secondary | ICD-10-CM | POA: Diagnosis not present

## 2014-12-04 DIAGNOSIS — J449 Chronic obstructive pulmonary disease, unspecified: Secondary | ICD-10-CM | POA: Diagnosis not present

## 2014-12-07 DIAGNOSIS — S81811D Laceration without foreign body, right lower leg, subsequent encounter: Secondary | ICD-10-CM | POA: Diagnosis not present

## 2014-12-07 DIAGNOSIS — Z9181 History of falling: Secondary | ICD-10-CM | POA: Diagnosis not present

## 2014-12-07 DIAGNOSIS — G309 Alzheimer's disease, unspecified: Secondary | ICD-10-CM | POA: Diagnosis not present

## 2014-12-07 DIAGNOSIS — J449 Chronic obstructive pulmonary disease, unspecified: Secondary | ICD-10-CM | POA: Diagnosis not present

## 2014-12-07 DIAGNOSIS — R260 Ataxic gait: Secondary | ICD-10-CM | POA: Diagnosis not present

## 2014-12-07 DIAGNOSIS — S76822D Laceration of other specified muscles, fascia and tendons at thigh level, left thigh, subsequent encounter: Secondary | ICD-10-CM | POA: Diagnosis not present

## 2014-12-07 DIAGNOSIS — M6281 Muscle weakness (generalized): Secondary | ICD-10-CM | POA: Diagnosis not present

## 2014-12-07 DIAGNOSIS — M4800 Spinal stenosis, site unspecified: Secondary | ICD-10-CM | POA: Diagnosis not present

## 2014-12-07 DIAGNOSIS — Z4800 Encounter for change or removal of nonsurgical wound dressing: Secondary | ICD-10-CM | POA: Diagnosis not present

## 2014-12-07 DIAGNOSIS — N181 Chronic kidney disease, stage 1: Secondary | ICD-10-CM | POA: Diagnosis not present

## 2014-12-07 DIAGNOSIS — F0280 Dementia in other diseases classified elsewhere without behavioral disturbance: Secondary | ICD-10-CM | POA: Diagnosis not present

## 2014-12-07 DIAGNOSIS — Z96641 Presence of right artificial hip joint: Secondary | ICD-10-CM | POA: Diagnosis not present

## 2014-12-07 DIAGNOSIS — M1991 Primary osteoarthritis, unspecified site: Secondary | ICD-10-CM | POA: Diagnosis not present

## 2014-12-07 DIAGNOSIS — E119 Type 2 diabetes mellitus without complications: Secondary | ICD-10-CM | POA: Diagnosis not present

## 2014-12-07 DIAGNOSIS — M810 Age-related osteoporosis without current pathological fracture: Secondary | ICD-10-CM | POA: Diagnosis not present

## 2014-12-07 DIAGNOSIS — J45909 Unspecified asthma, uncomplicated: Secondary | ICD-10-CM | POA: Diagnosis not present

## 2014-12-07 DIAGNOSIS — I129 Hypertensive chronic kidney disease with stage 1 through stage 4 chronic kidney disease, or unspecified chronic kidney disease: Secondary | ICD-10-CM | POA: Diagnosis not present

## 2014-12-07 DIAGNOSIS — F419 Anxiety disorder, unspecified: Secondary | ICD-10-CM | POA: Diagnosis not present

## 2014-12-10 ENCOUNTER — Ambulatory Visit (HOSPITAL_COMMUNITY): Payer: Medicaid - Dental | Admitting: Dentistry

## 2014-12-10 ENCOUNTER — Encounter (HOSPITAL_COMMUNITY): Payer: Self-pay | Admitting: Dentistry

## 2014-12-10 ENCOUNTER — Encounter (INDEPENDENT_AMBULATORY_CARE_PROVIDER_SITE_OTHER): Payer: Self-pay

## 2014-12-10 VITALS — BP 130/41 | HR 41 | Temp 97.5°F

## 2014-12-10 DIAGNOSIS — K117 Disturbances of salivary secretion: Secondary | ICD-10-CM

## 2014-12-10 DIAGNOSIS — K0530 Chronic periodontitis, unspecified: Secondary | ICD-10-CM

## 2014-12-10 DIAGNOSIS — IMO0002 Reserved for concepts with insufficient information to code with codable children: Secondary | ICD-10-CM

## 2014-12-10 DIAGNOSIS — K036 Deposits [accretions] on teeth: Secondary | ICD-10-CM

## 2014-12-10 DIAGNOSIS — R682 Dry mouth, unspecified: Secondary | ICD-10-CM

## 2014-12-10 NOTE — Patient Instructions (Addendum)
Return to clinic as scheduled for routine dental care. Call if problem arise before then. Dr. Kristin BruinsKulinski

## 2014-12-10 NOTE — Progress Notes (Signed)
DENTAL CONSULTATION  Date of Consultation:  12/10/2014 Patient Name:   [name] Date of Birth:   02/07/1916 Medical Record Number: [id number]  VITALS: BP 130/41 mmHg  Pulse 41  Temp(Src) 97.5 F (36.4 C) (Oral) Heart rate noted and is consistent with previous values.  CHIEF COMPLAINT: Patient seen for periodic dental examination and cleaning.  HPI: [name] is a 81 year old referred by her primary physician, Dr. Bufford Spikesiffany Reed, for routine dental care. The patient has multiple medical problems and currently resides at [facility]. The patient does have a previous right hip replacement and Dr. Jerl Santosalldorf was previously contacted and indicated that antibiotic premedication prior to invasive dental procedures was NOT indicated.  The patient currently denies acute toothache, swellings, or abscesses. Patient was last seen by Dental Medicine for a resin restoration in July of 2015.   PROBLEM LIST: Patient Active Problem List   Diagnosis Date Noted  . Inability to ambulate due to knee 11/03/2014  . Wound, open, leg 08/01/2014  . Severe malnutrition 08/14/2013    Class: Chronic  . PNA (pneumonia) 08/10/2013  . Cough 08/09/2013  . Failed total hip arthroplasty with dislocation 08/08/2013    Class: Acute  . Other malaise and fatigue 03/25/2013  . Senile osteoporosis   . Vitamin D deficiency   . Closed dislocation of shoulder, unspecified site   . Spinal stenosis, lumbar region, with neurogenic claudication   . Alzheimer's disease 02/06/2010  . Essential hypertension 02/06/2010  . ASTHMA, UNSPECIFIED, UNSPECIFIED STATUS 02/06/2010  . ABNORMAL EKG 02/06/2010    PMH: Past Medical History  Diagnosis Date  . COPD (chronic obstructive pulmonary disease)   . Hypertension   . Oral aphthae   . Orthostatic hypotension   . Senile osteoporosis   . Atrophy of vulva   . Unspecified urinary incontinence   . Urinary frequency   . Unspecified vitamin D  deficiency   . Anxiety state, unspecified   . Restless legs syndrome (RLS)   . Unspecified disorder of kidney and ureter   . Atrioventricular block, unspecified   . Closed dislocation of shoulder, unspecified site   . Anemia, unspecified   . Altered mental status   . Lumbago   . Transient disorder of initiating or maintaining sleep   . Cellulitis and abscess of unspecified site   . Sebaceous cyst   . Insomnia, unspecified   . Dementia in conditions classified elsewhere without behavioral disturbance   . Depressive disorder, not elsewhere classified   . Abnormality of gait   . Adult failure to thrive   . Headache(784.0)   . Gout, unspecified   . Extrinsic asthma, unspecified   . Chronic kidney disease, stage I   . Osteoarthrosis, unspecified whether generalized or localized, unspecified site   . Spinal stenosis, unspecified region other than cervical   . Edema   . Tachycardia, unspecified     PSH: Past Surgical History  Procedure Laterality Date  . Hip surgery      (R) hip replacement  . Cesarean section    . Abdominal hysterectomy      partial  . Back surgery      fusion  . Breast surgery      bilateral breast reduction  . Eye surgery      bilateral cataract  . Basal cell carcinoma excision    . Basal cell carcinoma excision      face  . Hip closed reduction Right 08/07/2013    Procedure: ATTEMPTED CLOSED  MANIPULATION HIP;  Surgeon: Jodi Marble, MD;  Location: [facility] OR;  Service: Orthopedics;  Laterality: Right;  . Total hip revision Right 08/08/2013    Procedure: TOTAL HIP REVISION- right;  Surgeon: Velna Ochs, MD;  Location: [facility] OR;  Service: Orthopedics;  Laterality: Right;    ALLERGIES: Allergies  Allergen Reactions  . Sulfa Antibiotics Shortness Of Breath    Worsens asthma  . Clonidine Derivatives   . Lactose Intolerance (Gi)   . Verapamil     NH MAR    MEDICATIONS: Current Outpatient Prescriptions  Medication Sig Dispense Refill  .  acetaminophen (TYLENOL) 325 MG tablet Take 650 mg by mouth every 6 (six) hours as needed for mild pain. Every 8 hours as needed for pain    . amLODipine (NORVASC) 10 MG tablet Take 1 tablet (10 mg total) by mouth daily. 30 tablet 0  . capsicum oleoresin (TRIXAICIN) 0.025 % cream Apply to right shoulder and upper arm three times daily (Patient taking differently: Apply 1 application topically 3 (three) times daily as needed (Pain). ) 56.6 g 0  . clonazePAM (KLONOPIN) 1 MG tablet Take one tablet by mouth at bedtime for anxiety 10 tablet 0  . docusate sodium (COLACE) 100 MG capsule Take 1 capsule (100 mg total) by mouth 2 (two) times daily. 10 capsule 0  . furosemide (LASIX) 20 MG tablet Take 1 tablet (20 mg total) by mouth every other day. For chf 15 tablet 3  . HYDROcodone-acetaminophen (NORCO/VICODIN) 5-325 MG per tablet Take 1-2 tablets by mouth 2 (two) times daily as needed for moderate pain. 30 tablet 0  . lisinopril (PRINIVIL,ZESTRIL) 20 MG tablet Take 1 tablet (20 mg total) by mouth daily. (Patient taking differently: Take 20 mg by mouth at bedtime. ) 30 tablet 0  . mirtazapine (REMERON) 7.5 MG tablet Take 1 tablet (7.5 mg total) by mouth at bedtime. 30 tablet 3  . Polyethyl Glycol-Propyl Glycol (SYSTANE) 0.4-0.3 % SOLN Instill one drop into each eye two times daily to relieve dry,itchy eye. 30 mL 5  . potassium chloride SA (K-DUR,KLOR-CON) 20 MEQ tablet Take 1 tablet (20 mEq total) by mouth every other day. For chf 15 tablet 3  . senna-docusate (SENOKOT-S) 8.6-50 MG per tablet Take 1 tablet by mouth daily.    Marland Kitchen triamcinolone cream (KENALOG) 0.5 % Apply 1 application topically 2 (two) times daily. To rash on legs 454 g 3   No current facility-administered medications for this visit.    LABS: Lab Results  Component Value Date   WBC 9.0 11/04/2014   HGB 12.9 11/04/2014   HCT 39.2 11/04/2014   MCV 86.7 11/04/2014   PLT 233 11/04/2014      Component Value Date/Time   NA 140 11/04/2014  0500   NA 142 09/05/2013 0915   K 4.2 11/04/2014 0500   CL 107 11/04/2014 0500   CO2 25 11/04/2014 0500   GLUCOSE 97 11/04/2014 0500   GLUCOSE 89 09/05/2013 0915   BUN 18 11/04/2014 0500   BUN 14 09/05/2013 0915   CREATININE 0.97 11/04/2014 0500   CALCIUM 9.1 11/04/2014 0500   GFRNONAA 48* 11/04/2014 0500   GFRAA 55* 11/04/2014 0500   Lab Results  Component Value Date   INR 1.4 05/03/2009   INR 1.3 05/02/2009   INR 1.1 05/01/2009   No results found for: PTT   DENTAL HISTORY: CHIEF COMPLAINT: Patient seen for periodic dental examination and cleaning.  HPI: [name] is a 81 year old referred by  her primary physician, Dr. Bufford Spikes, for routine dental care. The patient has multiple medical problems and currently resides at [facility]. The patient does have a previous right hip replacement and Dr. Jerl Santos was contacted and indicated that antibiotic premedication prior to invasive dental procedures was NOT indicated.  The patient currently denies acute toothache, swellings, or abscesses. Patient was last seen by Dental Medicine for a resin restoration in July of 2015.  DENTAL EXAMINATION: GENERAL: The patient is a well-developed, slightly built female in no acute distress. The patient walks with the aid of a walker. The patient was transported to the dental clinic by wheelchair today. HEAD AND NECK: There is no palpable lymphadenopathy. The patient denies acute TMJ symptoms but has bilateral TMJ crepitus. INTRAORAL EXAM: The patient has incipient xerostomia. There is no evidence of abscess formation. DENTITION: The patient is missing tooth numbers 1, 2, 3, 4, 14, 15, 16, 17, 18, 19, 20, 21, 29, 30, 31, and 32. Multiple rotated teeth are noted. PERIODONTAL:  The patient has chronic periodontitis with plaque and calculus accumulations, generalized gingival recession, and incipient tooth mobility. Tooth numbers 23 and 24 are splinted together by resin.  Adult prophylaxis was performed with Cavitron and hand curettes. [place]. Floss. Oral hygiene instruction provided. Fluoride varnish applied. DENTAL CARIES/SUBOPTIMAL RESTORATIONS: Multiple flexure lesions are noted. ENDODONTIC: The patient currently denies acute pulpitis symptoms.  CROWN AND BRIDGE: There are no crown or bridge restorations. PROSTHODONTIC: Patient currently has no partial dentures that she is wearing. OCCLUSION: Patient has a poor occlusal scheme secondary to multiple missing teeth, multiple rotated teeth, supra-eruption and drifting of the unopposed teeth into the edentulous areas, and lack of replacement of all missing teeth with dental prostheses.  ASSESSMENTS: 1. Chronic periodontitis with bone loss 2. Gingival recession 3. Incipient tooth mobility 4. Accretions 5. Multiple flexure lesions 6. Multiple missing teeth 7. Supra-eruption and drifting of the unopposed teeth into the edentulous areas 8  Multiple rotated teeth 9. No current upper or lower partial dentures 10. Poor occlusal scheme but stable malocclusion.  11. History of thrombocytopenia with risk for bleeding with invasive dental procedures 12. Status post total hip replacement with NO need for antibiotic premedication prior to invasive dental procedures per orthopedic surgeon-Dr. Marcene Corning  Procedure: Adult prophylaxis with Cavitron and multiple curettes as needed. Teeth polish. Fluoride varnish was applied. Oral hygiene instructions on brushing after meals and at bedtime, flossing at bedtime was provided today. Patient tolerated the procedure well. Patient was dismissed to the caregiver in stable condition. Patient is to return to clinic for periodontal maintenance in 6 months. Call if problems arise before then.   Charlynne Pander, DDS

## 2014-12-11 DIAGNOSIS — S76822D Laceration of other specified muscles, fascia and tendons at thigh level, left thigh, subsequent encounter: Secondary | ICD-10-CM | POA: Diagnosis not present

## 2014-12-11 DIAGNOSIS — Z96641 Presence of right artificial hip joint: Secondary | ICD-10-CM | POA: Diagnosis not present

## 2014-12-11 DIAGNOSIS — F419 Anxiety disorder, unspecified: Secondary | ICD-10-CM | POA: Diagnosis not present

## 2014-12-11 DIAGNOSIS — F0280 Dementia in other diseases classified elsewhere without behavioral disturbance: Secondary | ICD-10-CM | POA: Diagnosis not present

## 2014-12-11 DIAGNOSIS — M4800 Spinal stenosis, site unspecified: Secondary | ICD-10-CM | POA: Diagnosis not present

## 2014-12-11 DIAGNOSIS — M810 Age-related osteoporosis without current pathological fracture: Secondary | ICD-10-CM | POA: Diagnosis not present

## 2014-12-11 DIAGNOSIS — M1991 Primary osteoarthritis, unspecified site: Secondary | ICD-10-CM | POA: Diagnosis not present

## 2014-12-11 DIAGNOSIS — S81811D Laceration without foreign body, right lower leg, subsequent encounter: Secondary | ICD-10-CM | POA: Diagnosis not present

## 2014-12-11 DIAGNOSIS — I129 Hypertensive chronic kidney disease with stage 1 through stage 4 chronic kidney disease, or unspecified chronic kidney disease: Secondary | ICD-10-CM | POA: Diagnosis not present

## 2014-12-11 DIAGNOSIS — E119 Type 2 diabetes mellitus without complications: Secondary | ICD-10-CM | POA: Diagnosis not present

## 2014-12-11 DIAGNOSIS — R260 Ataxic gait: Secondary | ICD-10-CM | POA: Diagnosis not present

## 2014-12-11 DIAGNOSIS — G309 Alzheimer's disease, unspecified: Secondary | ICD-10-CM | POA: Diagnosis not present

## 2014-12-11 DIAGNOSIS — J449 Chronic obstructive pulmonary disease, unspecified: Secondary | ICD-10-CM | POA: Diagnosis not present

## 2014-12-11 DIAGNOSIS — N181 Chronic kidney disease, stage 1: Secondary | ICD-10-CM | POA: Diagnosis not present

## 2014-12-11 DIAGNOSIS — M6281 Muscle weakness (generalized): Secondary | ICD-10-CM | POA: Diagnosis not present

## 2014-12-11 DIAGNOSIS — J45909 Unspecified asthma, uncomplicated: Secondary | ICD-10-CM | POA: Diagnosis not present

## 2014-12-11 DIAGNOSIS — Z4800 Encounter for change or removal of nonsurgical wound dressing: Secondary | ICD-10-CM | POA: Diagnosis not present

## 2014-12-11 DIAGNOSIS — Z9181 History of falling: Secondary | ICD-10-CM | POA: Diagnosis not present

## 2014-12-13 DIAGNOSIS — M810 Age-related osteoporosis without current pathological fracture: Secondary | ICD-10-CM | POA: Diagnosis not present

## 2014-12-13 DIAGNOSIS — S81811D Laceration without foreign body, right lower leg, subsequent encounter: Secondary | ICD-10-CM | POA: Diagnosis not present

## 2014-12-13 DIAGNOSIS — F0280 Dementia in other diseases classified elsewhere without behavioral disturbance: Secondary | ICD-10-CM | POA: Diagnosis not present

## 2014-12-13 DIAGNOSIS — M6281 Muscle weakness (generalized): Secondary | ICD-10-CM | POA: Diagnosis not present

## 2014-12-13 DIAGNOSIS — J45909 Unspecified asthma, uncomplicated: Secondary | ICD-10-CM | POA: Diagnosis not present

## 2014-12-13 DIAGNOSIS — Z9181 History of falling: Secondary | ICD-10-CM | POA: Diagnosis not present

## 2014-12-13 DIAGNOSIS — G309 Alzheimer's disease, unspecified: Secondary | ICD-10-CM | POA: Diagnosis not present

## 2014-12-13 DIAGNOSIS — M4800 Spinal stenosis, site unspecified: Secondary | ICD-10-CM | POA: Diagnosis not present

## 2014-12-13 DIAGNOSIS — R260 Ataxic gait: Secondary | ICD-10-CM | POA: Diagnosis not present

## 2014-12-13 DIAGNOSIS — J449 Chronic obstructive pulmonary disease, unspecified: Secondary | ICD-10-CM | POA: Diagnosis not present

## 2014-12-13 DIAGNOSIS — E119 Type 2 diabetes mellitus without complications: Secondary | ICD-10-CM | POA: Diagnosis not present

## 2014-12-13 DIAGNOSIS — Z4800 Encounter for change or removal of nonsurgical wound dressing: Secondary | ICD-10-CM | POA: Diagnosis not present

## 2014-12-13 DIAGNOSIS — Z96641 Presence of right artificial hip joint: Secondary | ICD-10-CM | POA: Diagnosis not present

## 2014-12-13 DIAGNOSIS — N181 Chronic kidney disease, stage 1: Secondary | ICD-10-CM | POA: Diagnosis not present

## 2014-12-13 DIAGNOSIS — M1991 Primary osteoarthritis, unspecified site: Secondary | ICD-10-CM | POA: Diagnosis not present

## 2014-12-13 DIAGNOSIS — F419 Anxiety disorder, unspecified: Secondary | ICD-10-CM | POA: Diagnosis not present

## 2014-12-13 DIAGNOSIS — S76822D Laceration of other specified muscles, fascia and tendons at thigh level, left thigh, subsequent encounter: Secondary | ICD-10-CM | POA: Diagnosis not present

## 2014-12-13 DIAGNOSIS — I129 Hypertensive chronic kidney disease with stage 1 through stage 4 chronic kidney disease, or unspecified chronic kidney disease: Secondary | ICD-10-CM | POA: Diagnosis not present

## 2014-12-14 DIAGNOSIS — Z96641 Presence of right artificial hip joint: Secondary | ICD-10-CM | POA: Diagnosis not present

## 2014-12-14 DIAGNOSIS — S81811D Laceration without foreign body, right lower leg, subsequent encounter: Secondary | ICD-10-CM | POA: Diagnosis not present

## 2014-12-14 DIAGNOSIS — F419 Anxiety disorder, unspecified: Secondary | ICD-10-CM | POA: Diagnosis not present

## 2014-12-14 DIAGNOSIS — S76822D Laceration of other specified muscles, fascia and tendons at thigh level, left thigh, subsequent encounter: Secondary | ICD-10-CM | POA: Diagnosis not present

## 2014-12-14 DIAGNOSIS — M1991 Primary osteoarthritis, unspecified site: Secondary | ICD-10-CM | POA: Diagnosis not present

## 2014-12-14 DIAGNOSIS — R260 Ataxic gait: Secondary | ICD-10-CM | POA: Diagnosis not present

## 2014-12-14 DIAGNOSIS — M810 Age-related osteoporosis without current pathological fracture: Secondary | ICD-10-CM | POA: Diagnosis not present

## 2014-12-14 DIAGNOSIS — E119 Type 2 diabetes mellitus without complications: Secondary | ICD-10-CM | POA: Diagnosis not present

## 2014-12-14 DIAGNOSIS — N181 Chronic kidney disease, stage 1: Secondary | ICD-10-CM | POA: Diagnosis not present

## 2014-12-14 DIAGNOSIS — M4800 Spinal stenosis, site unspecified: Secondary | ICD-10-CM | POA: Diagnosis not present

## 2014-12-14 DIAGNOSIS — J449 Chronic obstructive pulmonary disease, unspecified: Secondary | ICD-10-CM | POA: Diagnosis not present

## 2014-12-14 DIAGNOSIS — Z4800 Encounter for change or removal of nonsurgical wound dressing: Secondary | ICD-10-CM | POA: Diagnosis not present

## 2014-12-14 DIAGNOSIS — J45909 Unspecified asthma, uncomplicated: Secondary | ICD-10-CM | POA: Diagnosis not present

## 2014-12-14 DIAGNOSIS — Z9181 History of falling: Secondary | ICD-10-CM | POA: Diagnosis not present

## 2014-12-14 DIAGNOSIS — G309 Alzheimer's disease, unspecified: Secondary | ICD-10-CM | POA: Diagnosis not present

## 2014-12-14 DIAGNOSIS — I129 Hypertensive chronic kidney disease with stage 1 through stage 4 chronic kidney disease, or unspecified chronic kidney disease: Secondary | ICD-10-CM | POA: Diagnosis not present

## 2014-12-14 DIAGNOSIS — M6281 Muscle weakness (generalized): Secondary | ICD-10-CM | POA: Diagnosis not present

## 2014-12-14 DIAGNOSIS — F0280 Dementia in other diseases classified elsewhere without behavioral disturbance: Secondary | ICD-10-CM | POA: Diagnosis not present

## 2014-12-17 ENCOUNTER — Other Ambulatory Visit: Payer: Self-pay | Admitting: *Deleted

## 2014-12-17 ENCOUNTER — Ambulatory Visit (INDEPENDENT_AMBULATORY_CARE_PROVIDER_SITE_OTHER): Payer: Medicare Other | Admitting: Nurse Practitioner

## 2014-12-17 ENCOUNTER — Encounter: Payer: Self-pay | Admitting: Nurse Practitioner

## 2014-12-17 VITALS — BP 138/68 | HR 37 | Temp 97.8°F | Resp 20 | Ht <= 58 in | Wt 107.0 lb

## 2014-12-17 DIAGNOSIS — R001 Bradycardia, unspecified: Secondary | ICD-10-CM | POA: Diagnosis not present

## 2014-12-17 DIAGNOSIS — I10 Essential (primary) hypertension: Secondary | ICD-10-CM

## 2014-12-17 DIAGNOSIS — T148 Other injury of unspecified body region: Secondary | ICD-10-CM | POA: Diagnosis not present

## 2014-12-17 DIAGNOSIS — S81801A Unspecified open wound, right lower leg, initial encounter: Secondary | ICD-10-CM

## 2014-12-17 DIAGNOSIS — T148XXA Other injury of unspecified body region, initial encounter: Secondary | ICD-10-CM

## 2014-12-17 MED ORDER — HYDROCODONE-ACETAMINOPHEN 5-325 MG PO TABS: ORAL_TABLET | ORAL | Status: DC

## 2014-12-17 MED ORDER — FUROSEMIDE 20 MG PO TABS: 20.0000 mg | ORAL_TABLET | ORAL | Status: DC

## 2014-12-17 MED ORDER — TRIAMCINOLONE ACETONIDE 0.5 % EX CREA: 1.0000 "application " | TOPICAL_CREAM | Freq: Every day | CUTANEOUS | Status: DC

## 2014-12-17 MED ORDER — LISINOPRIL 20 MG PO TABS: ORAL_TABLET | ORAL | Status: DC

## 2014-12-17 MED ORDER — AMLODIPINE BESYLATE 10 MG PO TABS: ORAL_TABLET | ORAL | Status: DC

## 2014-12-17 MED ORDER — MIRTAZAPINE 7.5 MG PO TABS: 7.5000 mg | ORAL_TABLET | Freq: Every day | ORAL | Status: DC

## 2014-12-17 MED ORDER — POTASSIUM CHLORIDE CRYS ER 20 MEQ PO TBCR: 20.0000 meq | EXTENDED_RELEASE_TABLET | ORAL | Status: AC

## 2014-12-17 MED ORDER — CLONAZEPAM 1 MG PO TABS: ORAL_TABLET | ORAL | Status: DC

## 2014-12-17 NOTE — Telephone Encounter (Signed)
[facility] 

## 2014-12-17 NOTE — Progress Notes (Signed)
Patient ID: [name], female   DOB: [birth year], 81 y.o.   MRN: [id number]    PCP: Bufford Spikes, DO  Allergies  Allergen Reactions  . Sulfa Antibiotics Shortness Of Breath    Worsens asthma  . Clonidine Derivatives   . Lactose Intolerance (Gi)   . Verapamil     [facility] MAR    Chief Complaint  Patient presents with  . Hospitalization Follow-up    hospital follow up 11/03/14     HPI: Patient is a 81 y.o. female seen in the office today to follow up hospitalization in feb. Pt With a hx of dementia, copd, anxiety. Pt fell out of bed at her AL and then had increased pain in the L knee. It was found that she had muscle strain with effusion: from ALF. PT/OT recommending SNF. Daughter called ortho who ordered MRI hip. Shows edema. No fracture- WBAT  pts HR in the 12s - daughter reports pt in second degree heart block- which has been for several years. Baseline HR is between 37-42 Daughter took HR for 1 min and reports it is 37.  Does report dizziness when she get up.   Here today to ensure she can cont outpt PT/OT Doing well with therapy- no falls since hospitalization  Reports no pain  Review of Systems:  Review of Systems  Constitutional: Positive for activity change and fatigue. Negative for fever, chills, diaphoresis, appetite change and unexpected weight change.  HENT: Positive for hearing loss.   Eyes: Visual disturbance: corrective lenses.  Respiratory: Negative for cough and shortness of breath.   Cardiovascular: Positive for leg swelling (chronic and stable). Negative for chest pain and palpitations.  Gastrointestinal: Negative for nausea, diarrhea, constipation and abdominal distention.  Musculoskeletal: Positive for back pain and gait problem. Negative for myalgias, arthralgias, neck pain and neck stiffness.  Skin: Positive for wound (small new skin tear to right shin). Negative for color change and rash.  Neurological: Positive for weakness. Negative for dizziness and  syncope.     Past Medical History  Diagnosis Date  . COPD (chronic obstructive pulmonary disease)   . Hypertension   . Oral aphthae   . Orthostatic hypotension   . Senile osteoporosis   . Atrophy of vulva   . Unspecified urinary incontinence   . Urinary frequency   . Unspecified vitamin D deficiency   . Anxiety state, unspecified   . Restless legs syndrome (RLS)   . Unspecified disorder of kidney and ureter   . Atrioventricular block, unspecified   . Closed dislocation of shoulder, unspecified site   . Anemia, unspecified   . Altered mental status   . Lumbago   . Transient disorder of initiating or maintaining sleep   . Cellulitis and abscess of unspecified site   . Sebaceous cyst   . Insomnia, unspecified   . Dementia in conditions classified elsewhere without behavioral disturbance   . Depressive disorder, not elsewhere classified   . Abnormality of gait   . Adult failure to thrive   . Headache(784.0)   . Gout, unspecified   . Extrinsic asthma, unspecified   . Chronic kidney disease, stage I   . Osteoarthrosis, unspecified whether generalized or localized, unspecified site   . Spinal stenosis, unspecified region other than cervical   . Edema   . Tachycardia, unspecified    Past Surgical History  Procedure Laterality Date  . Hip surgery      (R) hip replacement  . Cesarean section    .  Abdominal hysterectomy      partial  . Back surgery      fusion  . Breast surgery      bilateral breast reduction  . Eye surgery      bilateral cataract  . Basal cell carcinoma excision    . Basal cell carcinoma excision      face  . Hip closed reduction Right 08/07/2013    Procedure: ATTEMPTED CLOSED MANIPULATION HIP;  Surgeon: Jodi Marble, MD;  Location: [facility] OR;  Service: Orthopedics;  Laterality: Right;  . Total hip revision Right 08/08/2013    Procedure: TOTAL HIP REVISION- right;  Surgeon: Velna Ochs, MD;  Location: [facility] OR;  Service: Orthopedics;  Laterality:  Right;   Social History:   reports that she has never smoked. She has never used smokeless tobacco. She reports that she does not drink alcohol or use illicit drugs.  Family History  Problem Relation Age of Onset  . Diabetes Mother   . Diabetes Father   . Stroke Sister   . Heart attack Brother     Medications: Patient's Medications  New Prescriptions   No medications on file  Previous Medications   ACETAMINOPHEN (TYLENOL) 325 MG TABLET    Take 650 mg by mouth every 6 (six) hours as needed for mild pain. Every 8 hours as needed for pain   CAPSICUM OLEORESIN (TRIXAICIN) 0.025 % CREAM    Apply to right shoulder and upper arm three times daily   CLONAZEPAM (KLONOPIN) 1 MG TABLET    Take one tablet by mouth at bedtime for anxiety   FUROSEMIDE (LASIX) 20 MG TABLET    Take 1 tablet (20 mg total) by mouth every other day. For chf   HYDROCODONE-ACETAMINOPHEN (NORCO/VICODIN) 5-325 MG PER TABLET    Take one tablet by mouth twice daily for pain   MIRTAZAPINE (REMERON) 7.5 MG TABLET    Take 1 tablet (7.5 mg total) by mouth at bedtime.   POLYETHYL GLYCOL-PROPYL GLYCOL (SYSTANE) 0.4-0.3 % SOLN    Instill one drop into each eye two times daily to relieve dry,itchy eye.   POTASSIUM CHLORIDE SA (K-DUR,KLOR-CON) 20 MEQ TABLET    Take 1 tablet (20 mEq total) by mouth every other day. For chf   SENNA-DOCUSATE (SENOKOT-S) 8.6-50 MG PER TABLET    Take 1 tablet by mouth daily.  Modified Medications   Modified Medication Previous Medication   AMLODIPINE (NORVASC) 10 MG TABLET amLODipine (NORVASC) 10 MG tablet      1 by mouth in the evening for blood pressure    Take 1 tablet (10 mg total) by mouth daily.   LISINOPRIL (PRINIVIL,ZESTRIL) 20 MG TABLET lisinopril (PRINIVIL,ZESTRIL) 20 MG tablet      1 by mouth in the morning for blood pressure    Take 1 tablet (20 mg total) by mouth daily.   TRIAMCINOLONE CREAM (KENALOG) 0.5 % triamcinolone cream (KENALOG) 0.5 %      Apply 1 application topically daily. To rash  on legs    Apply 1 application topically 2 (two) times daily. To rash on legs  Discontinued Medications   DOCUSATE SODIUM (COLACE) 100 MG CAPSULE    Take 1 capsule (100 mg total) by mouth 2 (two) times daily.     Physical Exam:  Filed Vitals:   12/17/14 1350  BP: 138/68  Pulse: 37  Temp: 97.8 F (36.6 C)  TempSrc: Oral  Resp: 20  Height:  (1.422 m)  Weight: 107 lb (48.535 kg)  SpO2: 97%    Physical Exam  Constitutional:  Frail white female, ambulates with rolling walker  HENT:  Loss of hearing  Eyes: Conjunctivae and EOM are normal. Pupils are equal, round, and reactive to light.  Neck: No JVD present. No tracheal deviation present. No thyromegaly present.  Cardiovascular: Regular rhythm, normal heart sounds and intact distal pulses.   bradycardia  Pulmonary/Chest: Effort normal and breath sounds normal. No respiratory distress. She has no rales.  Abdominal: Soft. Bowel sounds are normal. She exhibits no distension. There is no tenderness.  Musculoskeletal: She exhibits edema and tenderness.  Lymphadenopathy:    She has no cervical adenopathy.  Neurological: She is alert. No cranial nerve deficit.  Oriented to person and place  Skin:  Small skin tear to right LE. No drainage or significant erythema   Psychiatric: She has a normal mood and affect.    Labs reviewed: Basic Metabolic Panel:  Recent Labs  44/11/4700/06/16 0842 11/04/14 0500  NA 141 140  K 4.4 4.2  CL 107 107  CO2 24 25  GLUCOSE 89 97  BUN 26* 18  CREATININE 1.00 0.97  CALCIUM 9.2 9.1   Liver Function Tests:  Recent Labs  11/03/14 0842 11/04/14 0500  AST 22 21  ALT 15 12  ALKPHOS 74 70  BILITOT 0.5 1.0  PROT 6.6 5.9*  ALBUMIN 3.8 3.5   No results for input(s): LIPASE, AMYLASE in the last 8760 hours. No results for input(s): AMMONIA in the last 8760 hours. CBC:  Recent Labs  11/03/14 0842 11/04/14 0500  WBC 8.3 9.0  NEUTROABS 5.6  --   HGB 12.1 12.9  HCT 36.5 39.2  MCV 86.3  86.7  PLT 222 233   Lipid Panel: No results for input(s): CHOL, HDL, LDLCALC, TRIG, CHOLHDL, LDLDIRECT in the last 8760 hours. TSH: No results for input(s): TSH in the last 8760 hours. A1C: No results found for: HGBA1C   Assessment/Plan  1. Essential hypertension -stable on current medications, will provide refills - lisinopril (PRINIVIL,ZESTRIL) 20 MG tablet; 1 by mouth in the morning for blood pressure  Dispense: 90 tablet; Refill: 3 - potassium chloride SA (K-DUR,KLOR-CON) 20 MEQ tablet; Take 1 tablet (20 mEq total) by mouth every other day. For chf  Dispense: 45 tablet; Refill: 3 - furosemide (LASIX) 20 MG tablet; Take 1 tablet (20 mg total) by mouth every other day. For chf  Dispense: 45 tablet; Refill: 3 - amLODipine (NORVASC) 10 MG tablet; 1 by mouth in the PM for blood pressure  Dispense: 90 tablet; Refill: 0  2. Wound, open, leg, right, initial encounter -small area to right leg, using dry dressing that is tearing off new skin, will have staff use Vaseline gauze   3. Bradycardia -does not want pacemaker -pt aware she needs to get up slowly to avoid dizziness -supportive care  4. Muscle strain -to left leg, overall improving, tolerating PT/OT with good results. Needs ongoing therapy now that she is back at AL.  - HYDROcodone-acetaminophen (NORCO/VICODIN) 5-325 MG per tablet; Take 1-2 tablet by mouth twice daily for pain  Dispense: 120 tablet; Refill: 0  Follow up with Dr Renato Gailseed in 3 months

## 2014-12-17 NOTE — Patient Instructions (Signed)
Use Vaseline gauze to right shin wound, change dressing daily and as needed  Cont with PT/OT

## 2014-12-18 DIAGNOSIS — Z4800 Encounter for change or removal of nonsurgical wound dressing: Secondary | ICD-10-CM | POA: Diagnosis not present

## 2014-12-18 DIAGNOSIS — J45909 Unspecified asthma, uncomplicated: Secondary | ICD-10-CM | POA: Diagnosis not present

## 2014-12-18 DIAGNOSIS — S76822D Laceration of other specified muscles, fascia and tendons at thigh level, left thigh, subsequent encounter: Secondary | ICD-10-CM | POA: Diagnosis not present

## 2014-12-18 DIAGNOSIS — M6281 Muscle weakness (generalized): Secondary | ICD-10-CM | POA: Diagnosis not present

## 2014-12-18 DIAGNOSIS — E119 Type 2 diabetes mellitus without complications: Secondary | ICD-10-CM | POA: Diagnosis not present

## 2014-12-18 DIAGNOSIS — F0280 Dementia in other diseases classified elsewhere without behavioral disturbance: Secondary | ICD-10-CM | POA: Diagnosis not present

## 2014-12-18 DIAGNOSIS — S81811D Laceration without foreign body, right lower leg, subsequent encounter: Secondary | ICD-10-CM | POA: Diagnosis not present

## 2014-12-18 DIAGNOSIS — N181 Chronic kidney disease, stage 1: Secondary | ICD-10-CM | POA: Diagnosis not present

## 2014-12-18 DIAGNOSIS — Z96641 Presence of right artificial hip joint: Secondary | ICD-10-CM | POA: Diagnosis not present

## 2014-12-18 DIAGNOSIS — M810 Age-related osteoporosis without current pathological fracture: Secondary | ICD-10-CM | POA: Diagnosis not present

## 2014-12-18 DIAGNOSIS — M4800 Spinal stenosis, site unspecified: Secondary | ICD-10-CM | POA: Diagnosis not present

## 2014-12-18 DIAGNOSIS — J449 Chronic obstructive pulmonary disease, unspecified: Secondary | ICD-10-CM | POA: Diagnosis not present

## 2014-12-18 DIAGNOSIS — M1991 Primary osteoarthritis, unspecified site: Secondary | ICD-10-CM | POA: Diagnosis not present

## 2014-12-18 DIAGNOSIS — R260 Ataxic gait: Secondary | ICD-10-CM | POA: Diagnosis not present

## 2014-12-18 DIAGNOSIS — I129 Hypertensive chronic kidney disease with stage 1 through stage 4 chronic kidney disease, or unspecified chronic kidney disease: Secondary | ICD-10-CM | POA: Diagnosis not present

## 2014-12-18 DIAGNOSIS — G309 Alzheimer's disease, unspecified: Secondary | ICD-10-CM | POA: Diagnosis not present

## 2014-12-18 DIAGNOSIS — Z9181 History of falling: Secondary | ICD-10-CM | POA: Diagnosis not present

## 2014-12-18 DIAGNOSIS — F419 Anxiety disorder, unspecified: Secondary | ICD-10-CM | POA: Diagnosis not present

## 2014-12-21 DIAGNOSIS — S76822D Laceration of other specified muscles, fascia and tendons at thigh level, left thigh, subsequent encounter: Secondary | ICD-10-CM | POA: Diagnosis not present

## 2014-12-21 DIAGNOSIS — G309 Alzheimer's disease, unspecified: Secondary | ICD-10-CM | POA: Diagnosis not present

## 2014-12-21 DIAGNOSIS — I129 Hypertensive chronic kidney disease with stage 1 through stage 4 chronic kidney disease, or unspecified chronic kidney disease: Secondary | ICD-10-CM | POA: Diagnosis not present

## 2014-12-21 DIAGNOSIS — M6281 Muscle weakness (generalized): Secondary | ICD-10-CM | POA: Diagnosis not present

## 2014-12-21 DIAGNOSIS — Z9181 History of falling: Secondary | ICD-10-CM | POA: Diagnosis not present

## 2014-12-21 DIAGNOSIS — M810 Age-related osteoporosis without current pathological fracture: Secondary | ICD-10-CM | POA: Diagnosis not present

## 2014-12-21 DIAGNOSIS — F419 Anxiety disorder, unspecified: Secondary | ICD-10-CM | POA: Diagnosis not present

## 2014-12-21 DIAGNOSIS — N181 Chronic kidney disease, stage 1: Secondary | ICD-10-CM | POA: Diagnosis not present

## 2014-12-21 DIAGNOSIS — M1991 Primary osteoarthritis, unspecified site: Secondary | ICD-10-CM | POA: Diagnosis not present

## 2014-12-21 DIAGNOSIS — M4800 Spinal stenosis, site unspecified: Secondary | ICD-10-CM | POA: Diagnosis not present

## 2014-12-21 DIAGNOSIS — Z96641 Presence of right artificial hip joint: Secondary | ICD-10-CM | POA: Diagnosis not present

## 2014-12-21 DIAGNOSIS — J45909 Unspecified asthma, uncomplicated: Secondary | ICD-10-CM | POA: Diagnosis not present

## 2014-12-21 DIAGNOSIS — E119 Type 2 diabetes mellitus without complications: Secondary | ICD-10-CM | POA: Diagnosis not present

## 2014-12-21 DIAGNOSIS — Z4800 Encounter for change or removal of nonsurgical wound dressing: Secondary | ICD-10-CM | POA: Diagnosis not present

## 2014-12-21 DIAGNOSIS — S81811D Laceration without foreign body, right lower leg, subsequent encounter: Secondary | ICD-10-CM | POA: Diagnosis not present

## 2014-12-21 DIAGNOSIS — F0280 Dementia in other diseases classified elsewhere without behavioral disturbance: Secondary | ICD-10-CM | POA: Diagnosis not present

## 2014-12-21 DIAGNOSIS — J449 Chronic obstructive pulmonary disease, unspecified: Secondary | ICD-10-CM | POA: Diagnosis not present

## 2014-12-21 DIAGNOSIS — R260 Ataxic gait: Secondary | ICD-10-CM | POA: Diagnosis not present

## 2014-12-25 DIAGNOSIS — Z4800 Encounter for change or removal of nonsurgical wound dressing: Secondary | ICD-10-CM | POA: Diagnosis not present

## 2014-12-25 DIAGNOSIS — M4800 Spinal stenosis, site unspecified: Secondary | ICD-10-CM | POA: Diagnosis not present

## 2014-12-25 DIAGNOSIS — M1991 Primary osteoarthritis, unspecified site: Secondary | ICD-10-CM | POA: Diagnosis not present

## 2014-12-25 DIAGNOSIS — S81811D Laceration without foreign body, right lower leg, subsequent encounter: Secondary | ICD-10-CM | POA: Diagnosis not present

## 2014-12-25 DIAGNOSIS — J449 Chronic obstructive pulmonary disease, unspecified: Secondary | ICD-10-CM | POA: Diagnosis not present

## 2014-12-25 DIAGNOSIS — R260 Ataxic gait: Secondary | ICD-10-CM | POA: Diagnosis not present

## 2014-12-25 DIAGNOSIS — F0280 Dementia in other diseases classified elsewhere without behavioral disturbance: Secondary | ICD-10-CM | POA: Diagnosis not present

## 2014-12-25 DIAGNOSIS — N181 Chronic kidney disease, stage 1: Secondary | ICD-10-CM | POA: Diagnosis not present

## 2014-12-25 DIAGNOSIS — G309 Alzheimer's disease, unspecified: Secondary | ICD-10-CM | POA: Diagnosis not present

## 2014-12-25 DIAGNOSIS — M810 Age-related osteoporosis without current pathological fracture: Secondary | ICD-10-CM | POA: Diagnosis not present

## 2014-12-25 DIAGNOSIS — I129 Hypertensive chronic kidney disease with stage 1 through stage 4 chronic kidney disease, or unspecified chronic kidney disease: Secondary | ICD-10-CM | POA: Diagnosis not present

## 2014-12-25 DIAGNOSIS — M6281 Muscle weakness (generalized): Secondary | ICD-10-CM | POA: Diagnosis not present

## 2014-12-25 DIAGNOSIS — F419 Anxiety disorder, unspecified: Secondary | ICD-10-CM | POA: Diagnosis not present

## 2014-12-25 DIAGNOSIS — Z9181 History of falling: Secondary | ICD-10-CM | POA: Diagnosis not present

## 2014-12-25 DIAGNOSIS — E119 Type 2 diabetes mellitus without complications: Secondary | ICD-10-CM | POA: Diagnosis not present

## 2014-12-25 DIAGNOSIS — Z96641 Presence of right artificial hip joint: Secondary | ICD-10-CM | POA: Diagnosis not present

## 2014-12-25 DIAGNOSIS — S76822D Laceration of other specified muscles, fascia and tendons at thigh level, left thigh, subsequent encounter: Secondary | ICD-10-CM | POA: Diagnosis not present

## 2014-12-25 DIAGNOSIS — J45909 Unspecified asthma, uncomplicated: Secondary | ICD-10-CM | POA: Diagnosis not present

## 2014-12-27 DIAGNOSIS — I129 Hypertensive chronic kidney disease with stage 1 through stage 4 chronic kidney disease, or unspecified chronic kidney disease: Secondary | ICD-10-CM | POA: Diagnosis not present

## 2014-12-27 DIAGNOSIS — Z4800 Encounter for change or removal of nonsurgical wound dressing: Secondary | ICD-10-CM | POA: Diagnosis not present

## 2014-12-27 DIAGNOSIS — F419 Anxiety disorder, unspecified: Secondary | ICD-10-CM | POA: Diagnosis not present

## 2014-12-27 DIAGNOSIS — S76822D Laceration of other specified muscles, fascia and tendons at thigh level, left thigh, subsequent encounter: Secondary | ICD-10-CM | POA: Diagnosis not present

## 2014-12-27 DIAGNOSIS — N181 Chronic kidney disease, stage 1: Secondary | ICD-10-CM | POA: Diagnosis not present

## 2014-12-27 DIAGNOSIS — J45909 Unspecified asthma, uncomplicated: Secondary | ICD-10-CM | POA: Diagnosis not present

## 2014-12-27 DIAGNOSIS — M1991 Primary osteoarthritis, unspecified site: Secondary | ICD-10-CM | POA: Diagnosis not present

## 2014-12-27 DIAGNOSIS — R260 Ataxic gait: Secondary | ICD-10-CM | POA: Diagnosis not present

## 2014-12-27 DIAGNOSIS — G309 Alzheimer's disease, unspecified: Secondary | ICD-10-CM | POA: Diagnosis not present

## 2014-12-27 DIAGNOSIS — J449 Chronic obstructive pulmonary disease, unspecified: Secondary | ICD-10-CM | POA: Diagnosis not present

## 2014-12-27 DIAGNOSIS — Z96641 Presence of right artificial hip joint: Secondary | ICD-10-CM | POA: Diagnosis not present

## 2014-12-27 DIAGNOSIS — E119 Type 2 diabetes mellitus without complications: Secondary | ICD-10-CM | POA: Diagnosis not present

## 2014-12-27 DIAGNOSIS — M4800 Spinal stenosis, site unspecified: Secondary | ICD-10-CM | POA: Diagnosis not present

## 2014-12-27 DIAGNOSIS — Z9181 History of falling: Secondary | ICD-10-CM | POA: Diagnosis not present

## 2014-12-27 DIAGNOSIS — M810 Age-related osteoporosis without current pathological fracture: Secondary | ICD-10-CM | POA: Diagnosis not present

## 2014-12-27 DIAGNOSIS — F0280 Dementia in other diseases classified elsewhere without behavioral disturbance: Secondary | ICD-10-CM | POA: Diagnosis not present

## 2014-12-27 DIAGNOSIS — M6281 Muscle weakness (generalized): Secondary | ICD-10-CM | POA: Diagnosis not present

## 2014-12-27 DIAGNOSIS — S81811D Laceration without foreign body, right lower leg, subsequent encounter: Secondary | ICD-10-CM | POA: Diagnosis not present

## 2014-12-31 DIAGNOSIS — R001 Bradycardia, unspecified: Secondary | ICD-10-CM | POA: Diagnosis not present

## 2014-12-31 DIAGNOSIS — S76822S Laceration of other specified muscles, fascia and tendons at thigh level, left thigh, sequela: Secondary | ICD-10-CM | POA: Diagnosis not present

## 2014-12-31 DIAGNOSIS — I129 Hypertensive chronic kidney disease with stage 1 through stage 4 chronic kidney disease, or unspecified chronic kidney disease: Secondary | ICD-10-CM | POA: Diagnosis not present

## 2014-12-31 DIAGNOSIS — E119 Type 2 diabetes mellitus without complications: Secondary | ICD-10-CM | POA: Diagnosis not present

## 2014-12-31 DIAGNOSIS — J45909 Unspecified asthma, uncomplicated: Secondary | ICD-10-CM | POA: Diagnosis not present

## 2014-12-31 DIAGNOSIS — Z4800 Encounter for change or removal of nonsurgical wound dressing: Secondary | ICD-10-CM | POA: Diagnosis not present

## 2014-12-31 DIAGNOSIS — S81811D Laceration without foreign body, right lower leg, subsequent encounter: Secondary | ICD-10-CM | POA: Diagnosis not present

## 2014-12-31 DIAGNOSIS — F0280 Dementia in other diseases classified elsewhere without behavioral disturbance: Secondary | ICD-10-CM | POA: Diagnosis not present

## 2014-12-31 DIAGNOSIS — N181 Chronic kidney disease, stage 1: Secondary | ICD-10-CM | POA: Diagnosis not present

## 2014-12-31 DIAGNOSIS — Z96641 Presence of right artificial hip joint: Secondary | ICD-10-CM | POA: Diagnosis not present

## 2014-12-31 DIAGNOSIS — I441 Atrioventricular block, second degree: Secondary | ICD-10-CM | POA: Diagnosis not present

## 2014-12-31 DIAGNOSIS — Z9181 History of falling: Secondary | ICD-10-CM | POA: Diagnosis not present

## 2014-12-31 DIAGNOSIS — F419 Anxiety disorder, unspecified: Secondary | ICD-10-CM | POA: Diagnosis not present

## 2014-12-31 DIAGNOSIS — J449 Chronic obstructive pulmonary disease, unspecified: Secondary | ICD-10-CM | POA: Diagnosis not present

## 2014-12-31 DIAGNOSIS — M810 Age-related osteoporosis without current pathological fracture: Secondary | ICD-10-CM | POA: Diagnosis not present

## 2014-12-31 DIAGNOSIS — M4800 Spinal stenosis, site unspecified: Secondary | ICD-10-CM | POA: Diagnosis not present

## 2014-12-31 DIAGNOSIS — G309 Alzheimer's disease, unspecified: Secondary | ICD-10-CM | POA: Diagnosis not present

## 2014-12-31 DIAGNOSIS — M1991 Primary osteoarthritis, unspecified site: Secondary | ICD-10-CM | POA: Diagnosis not present

## 2015-01-01 DIAGNOSIS — M1991 Primary osteoarthritis, unspecified site: Secondary | ICD-10-CM | POA: Diagnosis not present

## 2015-01-01 DIAGNOSIS — Z9181 History of falling: Secondary | ICD-10-CM | POA: Diagnosis not present

## 2015-01-01 DIAGNOSIS — R001 Bradycardia, unspecified: Secondary | ICD-10-CM | POA: Diagnosis not present

## 2015-01-01 DIAGNOSIS — Z4800 Encounter for change or removal of nonsurgical wound dressing: Secondary | ICD-10-CM | POA: Diagnosis not present

## 2015-01-01 DIAGNOSIS — E119 Type 2 diabetes mellitus without complications: Secondary | ICD-10-CM | POA: Diagnosis not present

## 2015-01-01 DIAGNOSIS — S76822S Laceration of other specified muscles, fascia and tendons at thigh level, left thigh, sequela: Secondary | ICD-10-CM | POA: Diagnosis not present

## 2015-01-01 DIAGNOSIS — I441 Atrioventricular block, second degree: Secondary | ICD-10-CM | POA: Diagnosis not present

## 2015-01-01 DIAGNOSIS — J45909 Unspecified asthma, uncomplicated: Secondary | ICD-10-CM | POA: Diagnosis not present

## 2015-01-01 DIAGNOSIS — N181 Chronic kidney disease, stage 1: Secondary | ICD-10-CM | POA: Diagnosis not present

## 2015-01-01 DIAGNOSIS — G309 Alzheimer's disease, unspecified: Secondary | ICD-10-CM | POA: Diagnosis not present

## 2015-01-01 DIAGNOSIS — J449 Chronic obstructive pulmonary disease, unspecified: Secondary | ICD-10-CM | POA: Diagnosis not present

## 2015-01-01 DIAGNOSIS — M810 Age-related osteoporosis without current pathological fracture: Secondary | ICD-10-CM | POA: Diagnosis not present

## 2015-01-01 DIAGNOSIS — F419 Anxiety disorder, unspecified: Secondary | ICD-10-CM | POA: Diagnosis not present

## 2015-01-01 DIAGNOSIS — M4800 Spinal stenosis, site unspecified: Secondary | ICD-10-CM | POA: Diagnosis not present

## 2015-01-01 DIAGNOSIS — F0280 Dementia in other diseases classified elsewhere without behavioral disturbance: Secondary | ICD-10-CM | POA: Diagnosis not present

## 2015-01-01 DIAGNOSIS — S81811D Laceration without foreign body, right lower leg, subsequent encounter: Secondary | ICD-10-CM | POA: Diagnosis not present

## 2015-01-01 DIAGNOSIS — I129 Hypertensive chronic kidney disease with stage 1 through stage 4 chronic kidney disease, or unspecified chronic kidney disease: Secondary | ICD-10-CM | POA: Diagnosis not present

## 2015-01-01 DIAGNOSIS — Z96641 Presence of right artificial hip joint: Secondary | ICD-10-CM | POA: Diagnosis not present

## 2015-01-03 DIAGNOSIS — M4800 Spinal stenosis, site unspecified: Secondary | ICD-10-CM | POA: Diagnosis not present

## 2015-01-03 DIAGNOSIS — R001 Bradycardia, unspecified: Secondary | ICD-10-CM | POA: Diagnosis not present

## 2015-01-03 DIAGNOSIS — S81811D Laceration without foreign body, right lower leg, subsequent encounter: Secondary | ICD-10-CM | POA: Diagnosis not present

## 2015-01-03 DIAGNOSIS — E119 Type 2 diabetes mellitus without complications: Secondary | ICD-10-CM | POA: Diagnosis not present

## 2015-01-03 DIAGNOSIS — I441 Atrioventricular block, second degree: Secondary | ICD-10-CM | POA: Diagnosis not present

## 2015-01-03 DIAGNOSIS — Z9181 History of falling: Secondary | ICD-10-CM | POA: Diagnosis not present

## 2015-01-03 DIAGNOSIS — Z96641 Presence of right artificial hip joint: Secondary | ICD-10-CM | POA: Diagnosis not present

## 2015-01-03 DIAGNOSIS — F419 Anxiety disorder, unspecified: Secondary | ICD-10-CM | POA: Diagnosis not present

## 2015-01-03 DIAGNOSIS — F0280 Dementia in other diseases classified elsewhere without behavioral disturbance: Secondary | ICD-10-CM | POA: Diagnosis not present

## 2015-01-03 DIAGNOSIS — J45909 Unspecified asthma, uncomplicated: Secondary | ICD-10-CM | POA: Diagnosis not present

## 2015-01-03 DIAGNOSIS — N181 Chronic kidney disease, stage 1: Secondary | ICD-10-CM | POA: Diagnosis not present

## 2015-01-03 DIAGNOSIS — M1991 Primary osteoarthritis, unspecified site: Secondary | ICD-10-CM | POA: Diagnosis not present

## 2015-01-03 DIAGNOSIS — S76822S Laceration of other specified muscles, fascia and tendons at thigh level, left thigh, sequela: Secondary | ICD-10-CM | POA: Diagnosis not present

## 2015-01-03 DIAGNOSIS — G309 Alzheimer's disease, unspecified: Secondary | ICD-10-CM | POA: Diagnosis not present

## 2015-01-03 DIAGNOSIS — M810 Age-related osteoporosis without current pathological fracture: Secondary | ICD-10-CM | POA: Diagnosis not present

## 2015-01-03 DIAGNOSIS — J449 Chronic obstructive pulmonary disease, unspecified: Secondary | ICD-10-CM | POA: Diagnosis not present

## 2015-01-03 DIAGNOSIS — I129 Hypertensive chronic kidney disease with stage 1 through stage 4 chronic kidney disease, or unspecified chronic kidney disease: Secondary | ICD-10-CM | POA: Diagnosis not present

## 2015-01-03 DIAGNOSIS — Z4800 Encounter for change or removal of nonsurgical wound dressing: Secondary | ICD-10-CM | POA: Diagnosis not present

## 2015-01-04 DIAGNOSIS — M4800 Spinal stenosis, site unspecified: Secondary | ICD-10-CM | POA: Diagnosis not present

## 2015-01-04 DIAGNOSIS — N181 Chronic kidney disease, stage 1: Secondary | ICD-10-CM | POA: Diagnosis not present

## 2015-01-04 DIAGNOSIS — S76822S Laceration of other specified muscles, fascia and tendons at thigh level, left thigh, sequela: Secondary | ICD-10-CM | POA: Diagnosis not present

## 2015-01-04 DIAGNOSIS — F0280 Dementia in other diseases classified elsewhere without behavioral disturbance: Secondary | ICD-10-CM | POA: Diagnosis not present

## 2015-01-04 DIAGNOSIS — Z9181 History of falling: Secondary | ICD-10-CM | POA: Diagnosis not present

## 2015-01-04 DIAGNOSIS — E119 Type 2 diabetes mellitus without complications: Secondary | ICD-10-CM | POA: Diagnosis not present

## 2015-01-04 DIAGNOSIS — G309 Alzheimer's disease, unspecified: Secondary | ICD-10-CM | POA: Diagnosis not present

## 2015-01-04 DIAGNOSIS — M810 Age-related osteoporosis without current pathological fracture: Secondary | ICD-10-CM | POA: Diagnosis not present

## 2015-01-04 DIAGNOSIS — J45909 Unspecified asthma, uncomplicated: Secondary | ICD-10-CM | POA: Diagnosis not present

## 2015-01-04 DIAGNOSIS — M1991 Primary osteoarthritis, unspecified site: Secondary | ICD-10-CM | POA: Diagnosis not present

## 2015-01-04 DIAGNOSIS — J449 Chronic obstructive pulmonary disease, unspecified: Secondary | ICD-10-CM | POA: Diagnosis not present

## 2015-01-04 DIAGNOSIS — Z4800 Encounter for change or removal of nonsurgical wound dressing: Secondary | ICD-10-CM | POA: Diagnosis not present

## 2015-01-04 DIAGNOSIS — R001 Bradycardia, unspecified: Secondary | ICD-10-CM | POA: Diagnosis not present

## 2015-01-04 DIAGNOSIS — Z96641 Presence of right artificial hip joint: Secondary | ICD-10-CM | POA: Diagnosis not present

## 2015-01-04 DIAGNOSIS — I129 Hypertensive chronic kidney disease with stage 1 through stage 4 chronic kidney disease, or unspecified chronic kidney disease: Secondary | ICD-10-CM | POA: Diagnosis not present

## 2015-01-04 DIAGNOSIS — I441 Atrioventricular block, second degree: Secondary | ICD-10-CM | POA: Diagnosis not present

## 2015-01-04 DIAGNOSIS — S81811D Laceration without foreign body, right lower leg, subsequent encounter: Secondary | ICD-10-CM | POA: Diagnosis not present

## 2015-01-04 DIAGNOSIS — F419 Anxiety disorder, unspecified: Secondary | ICD-10-CM | POA: Diagnosis not present

## 2015-01-08 ENCOUNTER — Emergency Department (HOSPITAL_COMMUNITY)
Admission: EM | Admit: 2015-01-08 | Discharge: 2015-01-08 | Disposition: A | Discharge status: Home / Self Care | Payer: Medicare Other | Attending: Emergency Medicine | Admitting: Emergency Medicine

## 2015-01-08 ENCOUNTER — Encounter (HOSPITAL_COMMUNITY): Payer: Self-pay | Admitting: Emergency Medicine

## 2015-01-08 ENCOUNTER — Telehealth: Payer: Self-pay | Admitting: *Deleted

## 2015-01-08 DIAGNOSIS — Z8742 Personal history of other diseases of the female genital tract: Secondary | ICD-10-CM | POA: Insufficient documentation

## 2015-01-08 DIAGNOSIS — R001 Bradycardia, unspecified: Secondary | ICD-10-CM | POA: Insufficient documentation

## 2015-01-08 DIAGNOSIS — J449 Chronic obstructive pulmonary disease, unspecified: Secondary | ICD-10-CM | POA: Insufficient documentation

## 2015-01-08 DIAGNOSIS — F329 Major depressive disorder, single episode, unspecified: Secondary | ICD-10-CM | POA: Insufficient documentation

## 2015-01-08 DIAGNOSIS — S76822S Laceration of other specified muscles, fascia and tendons at thigh level, left thigh, sequela: Secondary | ICD-10-CM | POA: Diagnosis not present

## 2015-01-08 DIAGNOSIS — J45909 Unspecified asthma, uncomplicated: Secondary | ICD-10-CM | POA: Insufficient documentation

## 2015-01-08 DIAGNOSIS — M1990 Unspecified osteoarthritis, unspecified site: Secondary | ICD-10-CM | POA: Insufficient documentation

## 2015-01-08 DIAGNOSIS — R5383 Other fatigue: Secondary | ICD-10-CM | POA: Diagnosis present

## 2015-01-08 DIAGNOSIS — M4800 Spinal stenosis, site unspecified: Secondary | ICD-10-CM | POA: Diagnosis not present

## 2015-01-08 DIAGNOSIS — Z862 Personal history of diseases of the blood and blood-forming organs and certain disorders involving the immune mechanism: Secondary | ICD-10-CM | POA: Diagnosis not present

## 2015-01-08 DIAGNOSIS — Z872 Personal history of diseases of the skin and subcutaneous tissue: Secondary | ICD-10-CM | POA: Diagnosis not present

## 2015-01-08 DIAGNOSIS — F0280 Dementia in other diseases classified elsewhere without behavioral disturbance: Secondary | ICD-10-CM | POA: Diagnosis not present

## 2015-01-08 DIAGNOSIS — F0390 Unspecified dementia without behavioral disturbance: Secondary | ICD-10-CM | POA: Insufficient documentation

## 2015-01-08 DIAGNOSIS — E119 Type 2 diabetes mellitus without complications: Secondary | ICD-10-CM | POA: Diagnosis not present

## 2015-01-08 DIAGNOSIS — S81811D Laceration without foreign body, right lower leg, subsequent encounter: Secondary | ICD-10-CM | POA: Diagnosis not present

## 2015-01-08 DIAGNOSIS — I441 Atrioventricular block, second degree: Secondary | ICD-10-CM | POA: Diagnosis not present

## 2015-01-08 DIAGNOSIS — M1991 Primary osteoarthritis, unspecified site: Secondary | ICD-10-CM | POA: Diagnosis not present

## 2015-01-08 DIAGNOSIS — Z8639 Personal history of other endocrine, nutritional and metabolic disease: Secondary | ICD-10-CM | POA: Insufficient documentation

## 2015-01-08 DIAGNOSIS — Z96641 Presence of right artificial hip joint: Secondary | ICD-10-CM | POA: Diagnosis not present

## 2015-01-08 DIAGNOSIS — N181 Chronic kidney disease, stage 1: Secondary | ICD-10-CM | POA: Diagnosis not present

## 2015-01-08 DIAGNOSIS — I129 Hypertensive chronic kidney disease with stage 1 through stage 4 chronic kidney disease, or unspecified chronic kidney disease: Secondary | ICD-10-CM | POA: Diagnosis not present

## 2015-01-08 DIAGNOSIS — Z8669 Personal history of other diseases of the nervous system and sense organs: Secondary | ICD-10-CM | POA: Insufficient documentation

## 2015-01-08 DIAGNOSIS — G309 Alzheimer's disease, unspecified: Secondary | ICD-10-CM | POA: Diagnosis not present

## 2015-01-08 DIAGNOSIS — I10 Essential (primary) hypertension: Secondary | ICD-10-CM

## 2015-01-08 DIAGNOSIS — F419 Anxiety disorder, unspecified: Secondary | ICD-10-CM | POA: Insufficient documentation

## 2015-01-08 DIAGNOSIS — Z4800 Encounter for change or removal of nonsurgical wound dressing: Secondary | ICD-10-CM | POA: Diagnosis not present

## 2015-01-08 DIAGNOSIS — R531 Weakness: Secondary | ICD-10-CM | POA: Diagnosis not present

## 2015-01-08 DIAGNOSIS — Z9181 History of falling: Secondary | ICD-10-CM | POA: Diagnosis not present

## 2015-01-08 DIAGNOSIS — Z8719 Personal history of other diseases of the digestive system: Secondary | ICD-10-CM | POA: Insufficient documentation

## 2015-01-08 DIAGNOSIS — Z8781 Personal history of (healed) traumatic fracture: Secondary | ICD-10-CM | POA: Insufficient documentation

## 2015-01-08 DIAGNOSIS — M810 Age-related osteoporosis without current pathological fracture: Secondary | ICD-10-CM | POA: Diagnosis not present

## 2015-01-08 DIAGNOSIS — R404 Transient alteration of awareness: Secondary | ICD-10-CM | POA: Diagnosis not present

## 2015-01-08 LAB — URINALYSIS, ROUTINE W REFLEX MICROSCOPIC
Bilirubin Urine: NEGATIVE
Glucose, UA: NEGATIVE mg/dL
Hgb urine dipstick: NEGATIVE
Ketones, ur: NEGATIVE mg/dL
Nitrite: NEGATIVE
Protein, ur: NEGATIVE mg/dL
Specific Gravity, Urine: 1.006 (ref 1.005–1.030)
Urobilinogen, UA: 0.2 mg/dL (ref 0.0–1.0)
pH: 5 (ref 5.0–8.0)

## 2015-01-08 LAB — CBC WITH DIFFERENTIAL/PLATELET
BASOS ABS: 0 10*3/uL (ref 0.0–0.1)
BASOS PCT: 1 % (ref 0–1)
EOS ABS: 0.3 10*3/uL (ref 0.0–0.7)
Eosinophils Relative: 4 % (ref 0–5)
HCT: 35.7 % — ABNORMAL LOW (ref 36.0–46.0)
Hemoglobin: 11.7 g/dL — ABNORMAL LOW (ref 12.0–15.0)
LYMPHS ABS: 2.3 10*3/uL (ref 0.7–4.0)
Lymphocytes Relative: 38 % (ref 12–46)
MCH: 28.5 pg (ref 26.0–34.0)
MCHC: 32.8 g/dL (ref 30.0–36.0)
MCV: 87.1 fL (ref 78.0–100.0)
Monocytes Absolute: 0.4 10*3/uL (ref 0.1–1.0)
Monocytes Relative: 7 % (ref 3–12)
NEUTROS PCT: 50 % (ref 43–77)
Neutro Abs: 3 10*3/uL (ref 1.7–7.7)
Platelets: 229 10*3/uL (ref 150–400)
RBC: 4.1 MIL/uL (ref 3.87–5.11)
RDW: 13.6 % (ref 11.5–15.5)
WBC: 6 10*3/uL (ref 4.0–10.5)

## 2015-01-08 LAB — URINE MICROSCOPIC-ADD ON

## 2015-01-08 LAB — COMPREHENSIVE METABOLIC PANEL
ALT: 12 U/L (ref 0–35)
AST: 18 U/L (ref 0–37)
Albumin: 3.7 g/dL (ref 3.5–5.2)
Alkaline Phosphatase: 73 U/L (ref 39–117)
Anion gap: 9 (ref 5–15)
BUN: 30 mg/dL — AB (ref 6–23)
CHLORIDE: 111 mmol/L (ref 96–112)
CO2: 25 mmol/L (ref 19–32)
CREATININE: 1.07 mg/dL (ref 0.50–1.10)
Calcium: 9.4 mg/dL (ref 8.4–10.5)
GFR, EST AFRICAN AMERICAN: 49 mL/min — AB (ref >90)
GFR, EST NON AFRICAN AMERICAN: 42 mL/min — AB (ref >90)
Glucose, Bld: 95 mg/dL (ref 70–99)
POTASSIUM: 4.7 mmol/L (ref 3.5–5.1)
SODIUM: 145 mmol/L (ref 135–145)
Total Bilirubin: 0.5 mg/dL (ref 0.3–1.2)
Total Protein: 6.3 g/dL (ref 6.0–8.3)

## 2015-01-08 LAB — TROPONIN I: Troponin I: 0.03 ng/mL (ref <0.031)

## 2015-01-08 LAB — PHOSPHORUS: Phosphorus: 4.2 mg/dL (ref 2.3–4.6)

## 2015-01-08 LAB — MAGNESIUM: MAGNESIUM: 2 mg/dL (ref 1.5–2.5)

## 2015-01-08 LAB — BRAIN NATRIURETIC PEPTIDE: B Natriuretic Peptide: 714.2 pg/mL — ABNORMAL HIGH (ref 0.0–100.0)

## 2015-01-08 LAB — TSH: TSH: 1.378 u[IU]/mL (ref 0.350–4.500)

## 2015-01-08 MED ORDER — FUROSEMIDE 20 MG PO TABS: 40.0000 mg | ORAL_TABLET | Freq: Every day | ORAL | Status: DC

## 2015-01-08 NOTE — ED Notes (Addendum)
[facility] has been called by Diplomatic Services operational officersecretary. Patient has been updated on status.

## 2015-01-08 NOTE — ED Notes (Signed)
Report given to [place], RN [facility]

## 2015-01-08 NOTE — ED Notes (Signed)
Pt ambulated in from the bathroom with assistance from myself, pt tolerated well. Pt states she normally walk with a walker at home.

## 2015-01-08 NOTE — ED Notes (Signed)
Per EMS, pt coming in today for evaluation of weakness. Pt usually able to walk but was unable to today. Pt also complaining of just not feeling well. Pt reports that her left leg has been hurting x 1 week. PTs HR was upper 30s to low 40s. NAD at this time. Pt alert x4.

## 2015-01-08 NOTE — ED Provider Notes (Signed)
CSN: [id number]     Arrival date & time 01/08/15  1253 History   First MD Initiated Contact with Patient 01/08/15 1254     Chief Complaint  Patient presents with  . Bradycardia  . Fatigue     HPI Patient is brought to the emergency department after generalized weakness today.  She states that she felt more weak and was having difficulty walking today.  She denies unilateral arm or leg weakness.  She states overall she just doesn't feel well.  She denies nausea vomiting or diarrhea.  No fevers or chills.  Denies chest pain.  She does report some discomfort and pain in her left leg over the past week without injury or trauma.  Patient has a known history of second-degree AV block and bradycardia.  Her heart rate is normally in the high 30s to high 40's.  In the past the patient does not want a pacemaker.  I discussed this briefly with the patient she continues to say that she would never want a pacemaker.   Past Medical History  Diagnosis Date  . COPD (chronic obstructive pulmonary disease)   . Hypertension   . Oral aphthae   . Orthostatic hypotension   . Senile osteoporosis   . Atrophy of vulva   . Unspecified urinary incontinence   . Urinary frequency   . Unspecified vitamin D deficiency   . Anxiety state, unspecified   . Restless legs syndrome (RLS)   . Unspecified disorder of kidney and ureter   . Atrioventricular block, unspecified   . Closed dislocation of shoulder, unspecified site   . Anemia, unspecified   . Altered mental status   . Lumbago   . Transient disorder of initiating or maintaining sleep   . Cellulitis and abscess of unspecified site   . Sebaceous cyst   . Insomnia, unspecified   . Dementia in conditions classified elsewhere without behavioral disturbance   . Depressive disorder, not elsewhere classified   . Abnormality of gait   . Adult failure to thrive   . Headache(784.0)   . Gout, unspecified   . Extrinsic asthma, unspecified   . Chronic kidney disease,  stage I   . Osteoarthrosis, unspecified whether generalized or localized, unspecified site   . Spinal stenosis, unspecified region other than cervical   . Edema   . Tachycardia, unspecified    Past Surgical History  Procedure Laterality Date  . Hip surgery      (R) hip replacement  . Cesarean section    . Abdominal hysterectomy      partial  . Back surgery      fusion  . Breast surgery      bilateral breast reduction  . Eye surgery      bilateral cataract  . Basal cell carcinoma excision    . Basal cell carcinoma excision      face  . Hip closed reduction Right 08/07/2013    Procedure: ATTEMPTED CLOSED MANIPULATION HIP;  Surgeon: Jodi Marbleavid A Thompson, MD;  [facility];  Service: Orthopedics;  Laterality: Right;  . Total hip revision Right 08/08/2013    Procedure: TOTAL HIP REVISION- right;  Surgeon: Velna OchsPeter G Dalldorf, MD;  Location: [facility] OR;  Service: Orthopedics;  Laterality: Right;   Family History  Problem Relation Age of Onset  . Diabetes Mother   . Diabetes Father   . Stroke Sister   . Heart attack Brother    History  Substance Use Topics  . Smoking status: Never Smoker   .  Smokeless tobacco: Never Used  . Alcohol Use: No   OB History    No data available     Review of Systems  All other systems reviewed and are negative.     Allergies  Sulfa antibiotics; Clonidine derivatives; Lactose intolerance (gi); and Verapamil  Home Medications   Prior to Admission medications   Medication Sig Start Date End Date Taking? Authorizing Provider  acetaminophen (TYLENOL) 325 MG tablet Take 650 mg by mouth every 6 (six) hours as needed for mild pain. Every 8 hours as needed for pain    Historical Provider, MD  amLODipine (NORVASC) 10 MG tablet 1 by mouth in the PM for blood pressure 12/17/14   Sharon Seller, NP  capsicum oleoresin (TRIXAICIN) 0.025 % cream Apply to right shoulder and upper arm three times daily Patient taking differently: Apply 1 application topically  3 (three) times daily as needed (Pain).  07/30/14   Tiffany L Reed, DO  clonazePAM (KLONOPIN) 1 MG tablet Take one tablet by mouth at bedtime for anxiety 12/17/14   Sharon Seller, NP  furosemide (LASIX) 20 MG tablet Take 1 tablet (20 mg total) by mouth every other day. For chf 12/17/14   Sharon Seller, NP  HYDROcodone-acetaminophen (NORCO/VICODIN) 5-325 MG per tablet Take 1-2 tablet by mouth twice daily for pain 12/17/14   Sharon Seller, NP  lisinopril (PRINIVIL,ZESTRIL) 20 MG tablet 1 by mouth in the morning for blood pressure 12/17/14   Sharon Seller, NP  mirtazapine (REMERON) 7.5 MG tablet Take 1 tablet (7.5 mg total) by mouth at bedtime. 12/17/14   Sharon Seller, NP  Polyethyl Glycol-Propyl Glycol (SYSTANE) 0.4-0.3 % SOLN Instill one drop into each eye two times daily to relieve dry,itchy eye. 02/01/14   Sharon Seller, NP  potassium chloride SA (K-DUR,KLOR-CON) 20 MEQ tablet Take 1 tablet (20 mEq total) by mouth every other day. For chf 12/17/14   Sharon Seller, NP  senna-docusate (SENOKOT-S) 8.6-50 MG per tablet Take 1 tablet by mouth daily.    Historical Provider, MD  triamcinolone cream (KENALOG) 0.5 % Apply 1 application topically daily. To rash on legs 12/17/14   Sharon Seller, NP   BP 187/48 mmHg  Temp(Src) 97.7 F (36.5 C) (Oral)  Resp 19  SpO2 96% Physical Exam  Constitutional: She is oriented to person, place, and time. She appears well-developed and well-nourished. No distress.  HENT:  Head: Normocephalic and atraumatic.  Eyes: EOM are normal.  Neck: Normal range of motion.  Cardiovascular:  Bradycardia.  Irregular rhythm  Pulmonary/Chest: Effort normal and breath sounds normal.  Abdominal: Soft. She exhibits no distension. There is no tenderness.  Musculoskeletal: Normal range of motion.  Full range of motion bilateral hips, knees, ankles.  Normal PT and DP pulses present in the bilateral lower extremities.  No unilateral leg swelling of the left as  compared to the right and no discoloration of the left lower extremity.  No focal tenderness or bruising is noted on the left lower extremity.  Neurological: She is alert and oriented to person, place, and time.  Skin: Skin is warm and dry.  Psychiatric: She has a normal mood and affect. Judgment normal.  Nursing note and vitals reviewed.   ED Course  Procedures (including critical care time) Labs Review Labs Reviewed  URINALYSIS, ROUTINE W REFLEX MICROSCOPIC - Abnormal; Notable for the following:    Leukocytes, UA TRACE (*)    All other components within normal limits  URINE  MICROSCOPIC-ADD ON - Abnormal; Notable for the following:    Casts HYALINE CASTS (*)    All other components within normal limits  CBC WITH DIFFERENTIAL/PLATELET  COMPREHENSIVE METABOLIC PANEL  TROPONIN I  TSH  MAGNESIUM  PHOSPHORUS  BRAIN NATRIURETIC PEPTIDE    Imaging Review No results found.   EKG Interpretation   Date/Time:  Tuesday January 08 2015 12:56:46 EDT Ventricular Rate:  42 PR Interval:  416 QRS Duration: 85 QT Interval:  556 QTC Calculation: 465 R Axis:   -19 Text Interpretation:  Second degree AV block, Mobitz II Supraventricular  bigeminy Probable left atrial enlargement Inferior infarct, old  Anteroseptal infarct, age indeterminate No significant change was found  Confirmed by Tome Wilson  MD, Caryn Bee ([id number]) on 01/08/2015 5:22:08 PM      MDM   Final diagnoses:  None    For the longest time the patient would not allow [place] to draw blood work.  She has a long-standing history of second-degree AV block.  This looks unchanged from before.  Her blood pressure is stable.  Urinalysis demonstrates no abnormalities.  Finally 4 hours and to the emergency stay the patient finally allowed [place] to draw blood.  Her blood work is pending at this time.  We will evaluate her hemoglobin as well as her BUN and creatinine and her other electrolytes to determine a cause for overall generalized weakness.  She  has no cough or congestion.  Doubt pneumonia.  Afebrile and besides her heart rate of signs are otherwise without significant abnormality.  I believe involve her blood work is without significant abnormality she can follow-up with her primary care physician.  Care will be transferred to Dr. Radford Pax at this time.    Azalia Bilis, MD 01/08/15 1725

## 2015-01-08 NOTE — ED Notes (Signed)
Pt does not want to go home via [facility]. Attempted to call [facility] for transportation they said to use [facility], pt notified. Pt said to call daughter. Attempted to call pts daughter with no answer, message left.

## 2015-01-08 NOTE — ED Notes (Signed)
Dr. Campos at the bedside.  

## 2015-01-08 NOTE — ED Notes (Signed)
[facility] is transporting patient to [facility]. Nursing home updated. Papers and DNR sent with patient via [facility]. Patient was updated

## 2015-01-08 NOTE — ED Notes (Signed)
Pt does not want any blood work drawn. Dr. Patria Maneampos notified.

## 2015-01-08 NOTE — Telephone Encounter (Signed)
[name] called and left message on machine and stated that patient was having Left leg pain behind the knee and slightly swollen, pain medication not effective. I returned her call and she stated that she has already sent patient to the ER due to increase weakness and facial Droop and increased confusion. Agreed. [facility]

## 2015-01-08 NOTE — Discharge Instructions (Signed)

## 2015-01-08 NOTE — ED Notes (Signed)
Pt told Dr. Patria Maneampos that she will allow us to draw blood now.

## 2015-01-09 DIAGNOSIS — I129 Hypertensive chronic kidney disease with stage 1 through stage 4 chronic kidney disease, or unspecified chronic kidney disease: Secondary | ICD-10-CM | POA: Diagnosis not present

## 2015-01-09 DIAGNOSIS — Z9181 History of falling: Secondary | ICD-10-CM | POA: Diagnosis not present

## 2015-01-09 DIAGNOSIS — J45909 Unspecified asthma, uncomplicated: Secondary | ICD-10-CM | POA: Diagnosis not present

## 2015-01-09 DIAGNOSIS — G309 Alzheimer's disease, unspecified: Secondary | ICD-10-CM | POA: Diagnosis not present

## 2015-01-09 DIAGNOSIS — Z4800 Encounter for change or removal of nonsurgical wound dressing: Secondary | ICD-10-CM | POA: Diagnosis not present

## 2015-01-09 DIAGNOSIS — J449 Chronic obstructive pulmonary disease, unspecified: Secondary | ICD-10-CM | POA: Diagnosis not present

## 2015-01-09 DIAGNOSIS — N181 Chronic kidney disease, stage 1: Secondary | ICD-10-CM | POA: Diagnosis not present

## 2015-01-09 DIAGNOSIS — S81811D Laceration without foreign body, right lower leg, subsequent encounter: Secondary | ICD-10-CM | POA: Diagnosis not present

## 2015-01-09 DIAGNOSIS — I441 Atrioventricular block, second degree: Secondary | ICD-10-CM | POA: Diagnosis not present

## 2015-01-09 DIAGNOSIS — M1991 Primary osteoarthritis, unspecified site: Secondary | ICD-10-CM | POA: Diagnosis not present

## 2015-01-09 DIAGNOSIS — F0280 Dementia in other diseases classified elsewhere without behavioral disturbance: Secondary | ICD-10-CM | POA: Diagnosis not present

## 2015-01-09 DIAGNOSIS — R001 Bradycardia, unspecified: Secondary | ICD-10-CM | POA: Diagnosis not present

## 2015-01-09 DIAGNOSIS — M810 Age-related osteoporosis without current pathological fracture: Secondary | ICD-10-CM | POA: Diagnosis not present

## 2015-01-09 DIAGNOSIS — E119 Type 2 diabetes mellitus without complications: Secondary | ICD-10-CM | POA: Diagnosis not present

## 2015-01-09 DIAGNOSIS — F419 Anxiety disorder, unspecified: Secondary | ICD-10-CM | POA: Diagnosis not present

## 2015-01-09 DIAGNOSIS — M4800 Spinal stenosis, site unspecified: Secondary | ICD-10-CM | POA: Diagnosis not present

## 2015-01-09 DIAGNOSIS — Z96641 Presence of right artificial hip joint: Secondary | ICD-10-CM | POA: Diagnosis not present

## 2015-01-09 DIAGNOSIS — S76822S Laceration of other specified muscles, fascia and tendons at thigh level, left thigh, sequela: Secondary | ICD-10-CM | POA: Diagnosis not present

## 2015-01-11 DIAGNOSIS — N181 Chronic kidney disease, stage 1: Secondary | ICD-10-CM | POA: Diagnosis not present

## 2015-01-11 DIAGNOSIS — M1991 Primary osteoarthritis, unspecified site: Secondary | ICD-10-CM | POA: Diagnosis not present

## 2015-01-11 DIAGNOSIS — M4800 Spinal stenosis, site unspecified: Secondary | ICD-10-CM | POA: Diagnosis not present

## 2015-01-11 DIAGNOSIS — S76822S Laceration of other specified muscles, fascia and tendons at thigh level, left thigh, sequela: Secondary | ICD-10-CM | POA: Diagnosis not present

## 2015-01-11 DIAGNOSIS — I441 Atrioventricular block, second degree: Secondary | ICD-10-CM | POA: Diagnosis not present

## 2015-01-11 DIAGNOSIS — J449 Chronic obstructive pulmonary disease, unspecified: Secondary | ICD-10-CM | POA: Diagnosis not present

## 2015-01-11 DIAGNOSIS — Z96641 Presence of right artificial hip joint: Secondary | ICD-10-CM | POA: Diagnosis not present

## 2015-01-11 DIAGNOSIS — R001 Bradycardia, unspecified: Secondary | ICD-10-CM | POA: Diagnosis not present

## 2015-01-11 DIAGNOSIS — F0280 Dementia in other diseases classified elsewhere without behavioral disturbance: Secondary | ICD-10-CM | POA: Diagnosis not present

## 2015-01-11 DIAGNOSIS — Z9181 History of falling: Secondary | ICD-10-CM | POA: Diagnosis not present

## 2015-01-11 DIAGNOSIS — F419 Anxiety disorder, unspecified: Secondary | ICD-10-CM | POA: Diagnosis not present

## 2015-01-11 DIAGNOSIS — J45909 Unspecified asthma, uncomplicated: Secondary | ICD-10-CM | POA: Diagnosis not present

## 2015-01-11 DIAGNOSIS — E119 Type 2 diabetes mellitus without complications: Secondary | ICD-10-CM | POA: Diagnosis not present

## 2015-01-11 DIAGNOSIS — S81811D Laceration without foreign body, right lower leg, subsequent encounter: Secondary | ICD-10-CM | POA: Diagnosis not present

## 2015-01-11 DIAGNOSIS — G309 Alzheimer's disease, unspecified: Secondary | ICD-10-CM | POA: Diagnosis not present

## 2015-01-11 DIAGNOSIS — M810 Age-related osteoporosis without current pathological fracture: Secondary | ICD-10-CM | POA: Diagnosis not present

## 2015-01-11 DIAGNOSIS — Z4800 Encounter for change or removal of nonsurgical wound dressing: Secondary | ICD-10-CM | POA: Diagnosis not present

## 2015-01-11 DIAGNOSIS — I129 Hypertensive chronic kidney disease with stage 1 through stage 4 chronic kidney disease, or unspecified chronic kidney disease: Secondary | ICD-10-CM | POA: Diagnosis not present

## 2015-01-16 DIAGNOSIS — F419 Anxiety disorder, unspecified: Secondary | ICD-10-CM | POA: Diagnosis not present

## 2015-01-16 DIAGNOSIS — J45909 Unspecified asthma, uncomplicated: Secondary | ICD-10-CM | POA: Diagnosis not present

## 2015-01-16 DIAGNOSIS — N181 Chronic kidney disease, stage 1: Secondary | ICD-10-CM | POA: Diagnosis not present

## 2015-01-16 DIAGNOSIS — I441 Atrioventricular block, second degree: Secondary | ICD-10-CM | POA: Diagnosis not present

## 2015-01-16 DIAGNOSIS — J449 Chronic obstructive pulmonary disease, unspecified: Secondary | ICD-10-CM | POA: Diagnosis not present

## 2015-01-16 DIAGNOSIS — Z4800 Encounter for change or removal of nonsurgical wound dressing: Secondary | ICD-10-CM | POA: Diagnosis not present

## 2015-01-16 DIAGNOSIS — G309 Alzheimer's disease, unspecified: Secondary | ICD-10-CM | POA: Diagnosis not present

## 2015-01-16 DIAGNOSIS — I129 Hypertensive chronic kidney disease with stage 1 through stage 4 chronic kidney disease, or unspecified chronic kidney disease: Secondary | ICD-10-CM | POA: Diagnosis not present

## 2015-01-16 DIAGNOSIS — S81811D Laceration without foreign body, right lower leg, subsequent encounter: Secondary | ICD-10-CM | POA: Diagnosis not present

## 2015-01-16 DIAGNOSIS — M4800 Spinal stenosis, site unspecified: Secondary | ICD-10-CM | POA: Diagnosis not present

## 2015-01-16 DIAGNOSIS — R001 Bradycardia, unspecified: Secondary | ICD-10-CM | POA: Diagnosis not present

## 2015-01-16 DIAGNOSIS — M810 Age-related osteoporosis without current pathological fracture: Secondary | ICD-10-CM | POA: Diagnosis not present

## 2015-01-16 DIAGNOSIS — F0280 Dementia in other diseases classified elsewhere without behavioral disturbance: Secondary | ICD-10-CM | POA: Diagnosis not present

## 2015-01-16 DIAGNOSIS — E119 Type 2 diabetes mellitus without complications: Secondary | ICD-10-CM | POA: Diagnosis not present

## 2015-01-16 DIAGNOSIS — S76822S Laceration of other specified muscles, fascia and tendons at thigh level, left thigh, sequela: Secondary | ICD-10-CM | POA: Diagnosis not present

## 2015-01-16 DIAGNOSIS — M1991 Primary osteoarthritis, unspecified site: Secondary | ICD-10-CM | POA: Diagnosis not present

## 2015-01-16 DIAGNOSIS — Z96641 Presence of right artificial hip joint: Secondary | ICD-10-CM | POA: Diagnosis not present

## 2015-01-16 DIAGNOSIS — Z9181 History of falling: Secondary | ICD-10-CM | POA: Diagnosis not present

## 2015-01-18 DIAGNOSIS — R001 Bradycardia, unspecified: Secondary | ICD-10-CM | POA: Diagnosis not present

## 2015-01-18 DIAGNOSIS — J449 Chronic obstructive pulmonary disease, unspecified: Secondary | ICD-10-CM | POA: Diagnosis not present

## 2015-01-18 DIAGNOSIS — I129 Hypertensive chronic kidney disease with stage 1 through stage 4 chronic kidney disease, or unspecified chronic kidney disease: Secondary | ICD-10-CM | POA: Diagnosis not present

## 2015-01-18 DIAGNOSIS — G309 Alzheimer's disease, unspecified: Secondary | ICD-10-CM | POA: Diagnosis not present

## 2015-01-18 DIAGNOSIS — Z4800 Encounter for change or removal of nonsurgical wound dressing: Secondary | ICD-10-CM | POA: Diagnosis not present

## 2015-01-18 DIAGNOSIS — M4800 Spinal stenosis, site unspecified: Secondary | ICD-10-CM | POA: Diagnosis not present

## 2015-01-18 DIAGNOSIS — F419 Anxiety disorder, unspecified: Secondary | ICD-10-CM | POA: Diagnosis not present

## 2015-01-18 DIAGNOSIS — S81811D Laceration without foreign body, right lower leg, subsequent encounter: Secondary | ICD-10-CM | POA: Diagnosis not present

## 2015-01-18 DIAGNOSIS — M810 Age-related osteoporosis without current pathological fracture: Secondary | ICD-10-CM | POA: Diagnosis not present

## 2015-01-18 DIAGNOSIS — J45909 Unspecified asthma, uncomplicated: Secondary | ICD-10-CM | POA: Diagnosis not present

## 2015-01-18 DIAGNOSIS — I441 Atrioventricular block, second degree: Secondary | ICD-10-CM | POA: Diagnosis not present

## 2015-01-18 DIAGNOSIS — Z96641 Presence of right artificial hip joint: Secondary | ICD-10-CM | POA: Diagnosis not present

## 2015-01-18 DIAGNOSIS — F0280 Dementia in other diseases classified elsewhere without behavioral disturbance: Secondary | ICD-10-CM | POA: Diagnosis not present

## 2015-01-18 DIAGNOSIS — M1991 Primary osteoarthritis, unspecified site: Secondary | ICD-10-CM | POA: Diagnosis not present

## 2015-01-18 DIAGNOSIS — Z9181 History of falling: Secondary | ICD-10-CM | POA: Diagnosis not present

## 2015-01-18 DIAGNOSIS — S76822S Laceration of other specified muscles, fascia and tendons at thigh level, left thigh, sequela: Secondary | ICD-10-CM | POA: Diagnosis not present

## 2015-01-18 DIAGNOSIS — N181 Chronic kidney disease, stage 1: Secondary | ICD-10-CM | POA: Diagnosis not present

## 2015-01-18 DIAGNOSIS — E119 Type 2 diabetes mellitus without complications: Secondary | ICD-10-CM | POA: Diagnosis not present

## 2015-01-19 DIAGNOSIS — Z9181 History of falling: Secondary | ICD-10-CM | POA: Diagnosis not present

## 2015-01-19 DIAGNOSIS — M1991 Primary osteoarthritis, unspecified site: Secondary | ICD-10-CM | POA: Diagnosis not present

## 2015-01-19 DIAGNOSIS — I441 Atrioventricular block, second degree: Secondary | ICD-10-CM | POA: Diagnosis not present

## 2015-01-19 DIAGNOSIS — J449 Chronic obstructive pulmonary disease, unspecified: Secondary | ICD-10-CM | POA: Diagnosis not present

## 2015-01-19 DIAGNOSIS — N181 Chronic kidney disease, stage 1: Secondary | ICD-10-CM | POA: Diagnosis not present

## 2015-01-19 DIAGNOSIS — F419 Anxiety disorder, unspecified: Secondary | ICD-10-CM | POA: Diagnosis not present

## 2015-01-19 DIAGNOSIS — Z96641 Presence of right artificial hip joint: Secondary | ICD-10-CM | POA: Diagnosis not present

## 2015-01-19 DIAGNOSIS — R001 Bradycardia, unspecified: Secondary | ICD-10-CM | POA: Diagnosis not present

## 2015-01-19 DIAGNOSIS — Z4800 Encounter for change or removal of nonsurgical wound dressing: Secondary | ICD-10-CM | POA: Diagnosis not present

## 2015-01-19 DIAGNOSIS — M810 Age-related osteoporosis without current pathological fracture: Secondary | ICD-10-CM | POA: Diagnosis not present

## 2015-01-19 DIAGNOSIS — E119 Type 2 diabetes mellitus without complications: Secondary | ICD-10-CM | POA: Diagnosis not present

## 2015-01-19 DIAGNOSIS — M4800 Spinal stenosis, site unspecified: Secondary | ICD-10-CM | POA: Diagnosis not present

## 2015-01-19 DIAGNOSIS — F0280 Dementia in other diseases classified elsewhere without behavioral disturbance: Secondary | ICD-10-CM | POA: Diagnosis not present

## 2015-01-19 DIAGNOSIS — G309 Alzheimer's disease, unspecified: Secondary | ICD-10-CM | POA: Diagnosis not present

## 2015-01-19 DIAGNOSIS — J45909 Unspecified asthma, uncomplicated: Secondary | ICD-10-CM | POA: Diagnosis not present

## 2015-01-19 DIAGNOSIS — S76822S Laceration of other specified muscles, fascia and tendons at thigh level, left thigh, sequela: Secondary | ICD-10-CM | POA: Diagnosis not present

## 2015-01-19 DIAGNOSIS — I129 Hypertensive chronic kidney disease with stage 1 through stage 4 chronic kidney disease, or unspecified chronic kidney disease: Secondary | ICD-10-CM | POA: Diagnosis not present

## 2015-01-19 DIAGNOSIS — S81811D Laceration without foreign body, right lower leg, subsequent encounter: Secondary | ICD-10-CM | POA: Diagnosis not present

## 2015-01-21 DIAGNOSIS — J449 Chronic obstructive pulmonary disease, unspecified: Secondary | ICD-10-CM | POA: Diagnosis not present

## 2015-01-21 DIAGNOSIS — Z96641 Presence of right artificial hip joint: Secondary | ICD-10-CM | POA: Diagnosis not present

## 2015-01-21 DIAGNOSIS — F0280 Dementia in other diseases classified elsewhere without behavioral disturbance: Secondary | ICD-10-CM | POA: Diagnosis not present

## 2015-01-21 DIAGNOSIS — M1991 Primary osteoarthritis, unspecified site: Secondary | ICD-10-CM | POA: Diagnosis not present

## 2015-01-21 DIAGNOSIS — S76822S Laceration of other specified muscles, fascia and tendons at thigh level, left thigh, sequela: Secondary | ICD-10-CM | POA: Diagnosis not present

## 2015-01-21 DIAGNOSIS — F419 Anxiety disorder, unspecified: Secondary | ICD-10-CM | POA: Diagnosis not present

## 2015-01-21 DIAGNOSIS — E119 Type 2 diabetes mellitus without complications: Secondary | ICD-10-CM | POA: Diagnosis not present

## 2015-01-21 DIAGNOSIS — J45909 Unspecified asthma, uncomplicated: Secondary | ICD-10-CM | POA: Diagnosis not present

## 2015-01-21 DIAGNOSIS — I129 Hypertensive chronic kidney disease with stage 1 through stage 4 chronic kidney disease, or unspecified chronic kidney disease: Secondary | ICD-10-CM | POA: Diagnosis not present

## 2015-01-21 DIAGNOSIS — M4800 Spinal stenosis, site unspecified: Secondary | ICD-10-CM | POA: Diagnosis not present

## 2015-01-21 DIAGNOSIS — G309 Alzheimer's disease, unspecified: Secondary | ICD-10-CM | POA: Diagnosis not present

## 2015-01-21 DIAGNOSIS — I441 Atrioventricular block, second degree: Secondary | ICD-10-CM | POA: Diagnosis not present

## 2015-01-21 DIAGNOSIS — R001 Bradycardia, unspecified: Secondary | ICD-10-CM | POA: Diagnosis not present

## 2015-01-21 DIAGNOSIS — Z4800 Encounter for change or removal of nonsurgical wound dressing: Secondary | ICD-10-CM | POA: Diagnosis not present

## 2015-01-21 DIAGNOSIS — N181 Chronic kidney disease, stage 1: Secondary | ICD-10-CM | POA: Diagnosis not present

## 2015-01-21 DIAGNOSIS — M810 Age-related osteoporosis without current pathological fracture: Secondary | ICD-10-CM | POA: Diagnosis not present

## 2015-01-21 DIAGNOSIS — S81811D Laceration without foreign body, right lower leg, subsequent encounter: Secondary | ICD-10-CM | POA: Diagnosis not present

## 2015-01-21 DIAGNOSIS — Z9181 History of falling: Secondary | ICD-10-CM | POA: Diagnosis not present

## 2015-01-22 DIAGNOSIS — Z9181 History of falling: Secondary | ICD-10-CM | POA: Diagnosis not present

## 2015-01-22 DIAGNOSIS — N181 Chronic kidney disease, stage 1: Secondary | ICD-10-CM | POA: Diagnosis not present

## 2015-01-22 DIAGNOSIS — M4800 Spinal stenosis, site unspecified: Secondary | ICD-10-CM | POA: Diagnosis not present

## 2015-01-22 DIAGNOSIS — S81811D Laceration without foreign body, right lower leg, subsequent encounter: Secondary | ICD-10-CM | POA: Diagnosis not present

## 2015-01-22 DIAGNOSIS — G309 Alzheimer's disease, unspecified: Secondary | ICD-10-CM | POA: Diagnosis not present

## 2015-01-22 DIAGNOSIS — S76822S Laceration of other specified muscles, fascia and tendons at thigh level, left thigh, sequela: Secondary | ICD-10-CM | POA: Diagnosis not present

## 2015-01-22 DIAGNOSIS — Z96641 Presence of right artificial hip joint: Secondary | ICD-10-CM | POA: Diagnosis not present

## 2015-01-22 DIAGNOSIS — F0280 Dementia in other diseases classified elsewhere without behavioral disturbance: Secondary | ICD-10-CM | POA: Diagnosis not present

## 2015-01-22 DIAGNOSIS — J45909 Unspecified asthma, uncomplicated: Secondary | ICD-10-CM | POA: Diagnosis not present

## 2015-01-22 DIAGNOSIS — I129 Hypertensive chronic kidney disease with stage 1 through stage 4 chronic kidney disease, or unspecified chronic kidney disease: Secondary | ICD-10-CM | POA: Diagnosis not present

## 2015-01-22 DIAGNOSIS — E119 Type 2 diabetes mellitus without complications: Secondary | ICD-10-CM | POA: Diagnosis not present

## 2015-01-22 DIAGNOSIS — J449 Chronic obstructive pulmonary disease, unspecified: Secondary | ICD-10-CM | POA: Diagnosis not present

## 2015-01-22 DIAGNOSIS — M810 Age-related osteoporosis without current pathological fracture: Secondary | ICD-10-CM | POA: Diagnosis not present

## 2015-01-22 DIAGNOSIS — I441 Atrioventricular block, second degree: Secondary | ICD-10-CM | POA: Diagnosis not present

## 2015-01-22 DIAGNOSIS — Z4800 Encounter for change or removal of nonsurgical wound dressing: Secondary | ICD-10-CM | POA: Diagnosis not present

## 2015-01-22 DIAGNOSIS — M1991 Primary osteoarthritis, unspecified site: Secondary | ICD-10-CM | POA: Diagnosis not present

## 2015-01-22 DIAGNOSIS — F419 Anxiety disorder, unspecified: Secondary | ICD-10-CM | POA: Diagnosis not present

## 2015-01-22 DIAGNOSIS — R001 Bradycardia, unspecified: Secondary | ICD-10-CM | POA: Diagnosis not present

## 2015-01-23 ENCOUNTER — Telehealth: Payer: Self-pay

## 2015-01-23 DIAGNOSIS — T148XXA Other injury of unspecified body region, initial encounter: Secondary | ICD-10-CM

## 2015-01-23 MED ORDER — HYDROCODONE-ACETAMINOPHEN 5-325 MG PO TABS: ORAL_TABLET | ORAL | Status: DC

## 2015-01-23 NOTE — Telephone Encounter (Signed)
I don't recall ever writing this order so it probably was not mine.  Let's use hydrocodone/apap 5/325mg  1 tab po bid for mild to moderate pain and two tablets po bid for severe pain.  Has she been seen lately?  I have signed loads of paperwork and orders written by other providers, but haven't laid eyes on her recently.

## 2015-01-23 NOTE — Telephone Encounter (Signed)
Geanie LoganAtesha from[facility] called to clarify order for Hydrocodone. Instructions have to be specific 1 or 2 but not 1-2 by mouth twice daily for pain.  Please clarify

## 2015-01-23 NOTE — Telephone Encounter (Signed)
I called At[facility]ha back at [facility]. Athesa requested that I fax new rx to: [phone]  Order faxed, I called Robin (daighter) and left message informing her patient needs to schedule a follow-up with Dr.Reed.

## 2015-01-24 DIAGNOSIS — M810 Age-related osteoporosis without current pathological fracture: Secondary | ICD-10-CM | POA: Diagnosis not present

## 2015-01-24 DIAGNOSIS — J449 Chronic obstructive pulmonary disease, unspecified: Secondary | ICD-10-CM | POA: Diagnosis not present

## 2015-01-24 DIAGNOSIS — J45909 Unspecified asthma, uncomplicated: Secondary | ICD-10-CM | POA: Diagnosis not present

## 2015-01-24 DIAGNOSIS — S81811D Laceration without foreign body, right lower leg, subsequent encounter: Secondary | ICD-10-CM | POA: Diagnosis not present

## 2015-01-24 DIAGNOSIS — S76822S Laceration of other specified muscles, fascia and tendons at thigh level, left thigh, sequela: Secondary | ICD-10-CM | POA: Diagnosis not present

## 2015-01-24 DIAGNOSIS — N181 Chronic kidney disease, stage 1: Secondary | ICD-10-CM | POA: Diagnosis not present

## 2015-01-24 DIAGNOSIS — Z9181 History of falling: Secondary | ICD-10-CM | POA: Diagnosis not present

## 2015-01-24 DIAGNOSIS — R001 Bradycardia, unspecified: Secondary | ICD-10-CM | POA: Diagnosis not present

## 2015-01-24 DIAGNOSIS — M1991 Primary osteoarthritis, unspecified site: Secondary | ICD-10-CM | POA: Diagnosis not present

## 2015-01-24 DIAGNOSIS — Z96641 Presence of right artificial hip joint: Secondary | ICD-10-CM | POA: Diagnosis not present

## 2015-01-24 DIAGNOSIS — I441 Atrioventricular block, second degree: Secondary | ICD-10-CM | POA: Diagnosis not present

## 2015-01-24 DIAGNOSIS — F419 Anxiety disorder, unspecified: Secondary | ICD-10-CM | POA: Diagnosis not present

## 2015-01-24 DIAGNOSIS — E119 Type 2 diabetes mellitus without complications: Secondary | ICD-10-CM | POA: Diagnosis not present

## 2015-01-24 DIAGNOSIS — I129 Hypertensive chronic kidney disease with stage 1 through stage 4 chronic kidney disease, or unspecified chronic kidney disease: Secondary | ICD-10-CM | POA: Diagnosis not present

## 2015-01-24 DIAGNOSIS — Z4800 Encounter for change or removal of nonsurgical wound dressing: Secondary | ICD-10-CM | POA: Diagnosis not present

## 2015-01-24 DIAGNOSIS — M4800 Spinal stenosis, site unspecified: Secondary | ICD-10-CM | POA: Diagnosis not present

## 2015-01-24 DIAGNOSIS — F0280 Dementia in other diseases classified elsewhere without behavioral disturbance: Secondary | ICD-10-CM | POA: Diagnosis not present

## 2015-01-24 DIAGNOSIS — G309 Alzheimer's disease, unspecified: Secondary | ICD-10-CM | POA: Diagnosis not present

## 2015-01-25 DIAGNOSIS — Z9181 History of falling: Secondary | ICD-10-CM | POA: Diagnosis not present

## 2015-01-25 DIAGNOSIS — J449 Chronic obstructive pulmonary disease, unspecified: Secondary | ICD-10-CM | POA: Diagnosis not present

## 2015-01-25 DIAGNOSIS — S81811D Laceration without foreign body, right lower leg, subsequent encounter: Secondary | ICD-10-CM | POA: Diagnosis not present

## 2015-01-25 DIAGNOSIS — N181 Chronic kidney disease, stage 1: Secondary | ICD-10-CM | POA: Diagnosis not present

## 2015-01-25 DIAGNOSIS — S76822S Laceration of other specified muscles, fascia and tendons at thigh level, left thigh, sequela: Secondary | ICD-10-CM | POA: Diagnosis not present

## 2015-01-25 DIAGNOSIS — E119 Type 2 diabetes mellitus without complications: Secondary | ICD-10-CM | POA: Diagnosis not present

## 2015-01-25 DIAGNOSIS — M4800 Spinal stenosis, site unspecified: Secondary | ICD-10-CM | POA: Diagnosis not present

## 2015-01-25 DIAGNOSIS — G309 Alzheimer's disease, unspecified: Secondary | ICD-10-CM | POA: Diagnosis not present

## 2015-01-25 DIAGNOSIS — M1991 Primary osteoarthritis, unspecified site: Secondary | ICD-10-CM | POA: Diagnosis not present

## 2015-01-25 DIAGNOSIS — I129 Hypertensive chronic kidney disease with stage 1 through stage 4 chronic kidney disease, or unspecified chronic kidney disease: Secondary | ICD-10-CM | POA: Diagnosis not present

## 2015-01-25 DIAGNOSIS — J45909 Unspecified asthma, uncomplicated: Secondary | ICD-10-CM | POA: Diagnosis not present

## 2015-01-25 DIAGNOSIS — F0280 Dementia in other diseases classified elsewhere without behavioral disturbance: Secondary | ICD-10-CM | POA: Diagnosis not present

## 2015-01-25 DIAGNOSIS — F419 Anxiety disorder, unspecified: Secondary | ICD-10-CM | POA: Diagnosis not present

## 2015-01-25 DIAGNOSIS — Z96641 Presence of right artificial hip joint: Secondary | ICD-10-CM | POA: Diagnosis not present

## 2015-01-25 DIAGNOSIS — R001 Bradycardia, unspecified: Secondary | ICD-10-CM | POA: Diagnosis not present

## 2015-01-25 DIAGNOSIS — M810 Age-related osteoporosis without current pathological fracture: Secondary | ICD-10-CM | POA: Diagnosis not present

## 2015-01-25 DIAGNOSIS — Z4800 Encounter for change or removal of nonsurgical wound dressing: Secondary | ICD-10-CM | POA: Diagnosis not present

## 2015-01-25 DIAGNOSIS — I441 Atrioventricular block, second degree: Secondary | ICD-10-CM | POA: Diagnosis not present

## 2015-01-28 DIAGNOSIS — F0280 Dementia in other diseases classified elsewhere without behavioral disturbance: Secondary | ICD-10-CM | POA: Diagnosis not present

## 2015-01-28 DIAGNOSIS — S76822S Laceration of other specified muscles, fascia and tendons at thigh level, left thigh, sequela: Secondary | ICD-10-CM | POA: Diagnosis not present

## 2015-01-28 DIAGNOSIS — S81811D Laceration without foreign body, right lower leg, subsequent encounter: Secondary | ICD-10-CM | POA: Diagnosis not present

## 2015-01-28 DIAGNOSIS — M810 Age-related osteoporosis without current pathological fracture: Secondary | ICD-10-CM | POA: Diagnosis not present

## 2015-01-28 DIAGNOSIS — M4800 Spinal stenosis, site unspecified: Secondary | ICD-10-CM | POA: Diagnosis not present

## 2015-01-28 DIAGNOSIS — Z4800 Encounter for change or removal of nonsurgical wound dressing: Secondary | ICD-10-CM | POA: Diagnosis not present

## 2015-01-28 DIAGNOSIS — G309 Alzheimer's disease, unspecified: Secondary | ICD-10-CM | POA: Diagnosis not present

## 2015-01-28 DIAGNOSIS — N181 Chronic kidney disease, stage 1: Secondary | ICD-10-CM | POA: Diagnosis not present

## 2015-01-28 DIAGNOSIS — I441 Atrioventricular block, second degree: Secondary | ICD-10-CM | POA: Diagnosis not present

## 2015-01-28 DIAGNOSIS — J449 Chronic obstructive pulmonary disease, unspecified: Secondary | ICD-10-CM | POA: Diagnosis not present

## 2015-01-28 DIAGNOSIS — F419 Anxiety disorder, unspecified: Secondary | ICD-10-CM | POA: Diagnosis not present

## 2015-01-28 DIAGNOSIS — Z96641 Presence of right artificial hip joint: Secondary | ICD-10-CM | POA: Diagnosis not present

## 2015-01-28 DIAGNOSIS — R001 Bradycardia, unspecified: Secondary | ICD-10-CM | POA: Diagnosis not present

## 2015-01-28 DIAGNOSIS — Z9181 History of falling: Secondary | ICD-10-CM | POA: Diagnosis not present

## 2015-01-28 DIAGNOSIS — M1991 Primary osteoarthritis, unspecified site: Secondary | ICD-10-CM | POA: Diagnosis not present

## 2015-01-28 DIAGNOSIS — E119 Type 2 diabetes mellitus without complications: Secondary | ICD-10-CM | POA: Diagnosis not present

## 2015-01-28 DIAGNOSIS — J45909 Unspecified asthma, uncomplicated: Secondary | ICD-10-CM | POA: Diagnosis not present

## 2015-01-28 DIAGNOSIS — I129 Hypertensive chronic kidney disease with stage 1 through stage 4 chronic kidney disease, or unspecified chronic kidney disease: Secondary | ICD-10-CM | POA: Diagnosis not present

## 2015-01-29 DIAGNOSIS — S81811D Laceration without foreign body, right lower leg, subsequent encounter: Secondary | ICD-10-CM | POA: Diagnosis not present

## 2015-01-29 DIAGNOSIS — G309 Alzheimer's disease, unspecified: Secondary | ICD-10-CM | POA: Diagnosis not present

## 2015-01-29 DIAGNOSIS — S76822S Laceration of other specified muscles, fascia and tendons at thigh level, left thigh, sequela: Secondary | ICD-10-CM | POA: Diagnosis not present

## 2015-01-29 DIAGNOSIS — M4800 Spinal stenosis, site unspecified: Secondary | ICD-10-CM | POA: Diagnosis not present

## 2015-01-29 DIAGNOSIS — Z4800 Encounter for change or removal of nonsurgical wound dressing: Secondary | ICD-10-CM | POA: Diagnosis not present

## 2015-01-29 DIAGNOSIS — M810 Age-related osteoporosis without current pathological fracture: Secondary | ICD-10-CM | POA: Diagnosis not present

## 2015-01-29 DIAGNOSIS — Z9181 History of falling: Secondary | ICD-10-CM | POA: Diagnosis not present

## 2015-01-29 DIAGNOSIS — R001 Bradycardia, unspecified: Secondary | ICD-10-CM | POA: Diagnosis not present

## 2015-01-29 DIAGNOSIS — E119 Type 2 diabetes mellitus without complications: Secondary | ICD-10-CM | POA: Diagnosis not present

## 2015-01-29 DIAGNOSIS — F419 Anxiety disorder, unspecified: Secondary | ICD-10-CM | POA: Diagnosis not present

## 2015-01-29 DIAGNOSIS — N181 Chronic kidney disease, stage 1: Secondary | ICD-10-CM | POA: Diagnosis not present

## 2015-01-29 DIAGNOSIS — J449 Chronic obstructive pulmonary disease, unspecified: Secondary | ICD-10-CM | POA: Diagnosis not present

## 2015-01-29 DIAGNOSIS — J45909 Unspecified asthma, uncomplicated: Secondary | ICD-10-CM | POA: Diagnosis not present

## 2015-01-29 DIAGNOSIS — I129 Hypertensive chronic kidney disease with stage 1 through stage 4 chronic kidney disease, or unspecified chronic kidney disease: Secondary | ICD-10-CM | POA: Diagnosis not present

## 2015-01-29 DIAGNOSIS — F0280 Dementia in other diseases classified elsewhere without behavioral disturbance: Secondary | ICD-10-CM | POA: Diagnosis not present

## 2015-01-29 DIAGNOSIS — Z96641 Presence of right artificial hip joint: Secondary | ICD-10-CM | POA: Diagnosis not present

## 2015-01-29 DIAGNOSIS — M1991 Primary osteoarthritis, unspecified site: Secondary | ICD-10-CM | POA: Diagnosis not present

## 2015-01-29 DIAGNOSIS — I441 Atrioventricular block, second degree: Secondary | ICD-10-CM | POA: Diagnosis not present

## 2015-01-30 DIAGNOSIS — E119 Type 2 diabetes mellitus without complications: Secondary | ICD-10-CM | POA: Diagnosis not present

## 2015-01-30 DIAGNOSIS — Z9181 History of falling: Secondary | ICD-10-CM | POA: Diagnosis not present

## 2015-01-30 DIAGNOSIS — M810 Age-related osteoporosis without current pathological fracture: Secondary | ICD-10-CM | POA: Diagnosis not present

## 2015-01-30 DIAGNOSIS — J45909 Unspecified asthma, uncomplicated: Secondary | ICD-10-CM | POA: Diagnosis not present

## 2015-01-30 DIAGNOSIS — I441 Atrioventricular block, second degree: Secondary | ICD-10-CM | POA: Diagnosis not present

## 2015-01-30 DIAGNOSIS — Z4800 Encounter for change or removal of nonsurgical wound dressing: Secondary | ICD-10-CM | POA: Diagnosis not present

## 2015-01-30 DIAGNOSIS — Z96641 Presence of right artificial hip joint: Secondary | ICD-10-CM | POA: Diagnosis not present

## 2015-01-30 DIAGNOSIS — R001 Bradycardia, unspecified: Secondary | ICD-10-CM | POA: Diagnosis not present

## 2015-01-30 DIAGNOSIS — F0280 Dementia in other diseases classified elsewhere without behavioral disturbance: Secondary | ICD-10-CM | POA: Diagnosis not present

## 2015-01-30 DIAGNOSIS — N181 Chronic kidney disease, stage 1: Secondary | ICD-10-CM | POA: Diagnosis not present

## 2015-01-30 DIAGNOSIS — G309 Alzheimer's disease, unspecified: Secondary | ICD-10-CM | POA: Diagnosis not present

## 2015-01-30 DIAGNOSIS — M1991 Primary osteoarthritis, unspecified site: Secondary | ICD-10-CM | POA: Diagnosis not present

## 2015-01-30 DIAGNOSIS — S81811D Laceration without foreign body, right lower leg, subsequent encounter: Secondary | ICD-10-CM | POA: Diagnosis not present

## 2015-01-30 DIAGNOSIS — S76822S Laceration of other specified muscles, fascia and tendons at thigh level, left thigh, sequela: Secondary | ICD-10-CM | POA: Diagnosis not present

## 2015-01-30 DIAGNOSIS — I129 Hypertensive chronic kidney disease with stage 1 through stage 4 chronic kidney disease, or unspecified chronic kidney disease: Secondary | ICD-10-CM | POA: Diagnosis not present

## 2015-01-30 DIAGNOSIS — F419 Anxiety disorder, unspecified: Secondary | ICD-10-CM | POA: Diagnosis not present

## 2015-01-30 DIAGNOSIS — M4800 Spinal stenosis, site unspecified: Secondary | ICD-10-CM | POA: Diagnosis not present

## 2015-01-30 DIAGNOSIS — J449 Chronic obstructive pulmonary disease, unspecified: Secondary | ICD-10-CM | POA: Diagnosis not present

## 2015-01-31 ENCOUNTER — Ambulatory Visit (INDEPENDENT_AMBULATORY_CARE_PROVIDER_SITE_OTHER): Payer: Medicare Other | Admitting: Internal Medicine

## 2015-01-31 ENCOUNTER — Encounter: Payer: Self-pay | Admitting: Internal Medicine

## 2015-01-31 VITALS — BP 98/72 | HR 40 | Temp 97.9°F | Resp 20 | Ht <= 58 in | Wt 107.0 lb

## 2015-01-31 DIAGNOSIS — L03116 Cellulitis of left lower limb: Secondary | ICD-10-CM

## 2015-01-31 DIAGNOSIS — G309 Alzheimer's disease, unspecified: Secondary | ICD-10-CM

## 2015-01-31 DIAGNOSIS — L97929 Non-pressure chronic ulcer of unspecified part of left lower leg with unspecified severity: Secondary | ICD-10-CM

## 2015-01-31 DIAGNOSIS — I872 Venous insufficiency (chronic) (peripheral): Secondary | ICD-10-CM

## 2015-01-31 DIAGNOSIS — R001 Bradycardia, unspecified: Secondary | ICD-10-CM

## 2015-01-31 DIAGNOSIS — I10 Essential (primary) hypertension: Secondary | ICD-10-CM | POA: Diagnosis not present

## 2015-01-31 DIAGNOSIS — I87312 Chronic venous hypertension (idiopathic) with ulcer of left lower extremity: Secondary | ICD-10-CM | POA: Diagnosis not present

## 2015-01-31 DIAGNOSIS — F0280 Dementia in other diseases classified elsewhere without behavioral disturbance: Secondary | ICD-10-CM

## 2015-01-31 DIAGNOSIS — I83029 Varicose veins of left lower extremity with ulcer of unspecified site: Secondary | ICD-10-CM

## 2015-01-31 MED ORDER — SACCHAROMYCES BOULARDII 250 MG PO CAPS: 250.0000 mg | ORAL_CAPSULE | Freq: Two times a day (BID) | ORAL | Status: DC

## 2015-01-31 MED ORDER — DOXYCYCLINE HYCLATE 100 MG PO TABS: 100.0000 mg | ORAL_TABLET | Freq: Two times a day (BID) | ORAL | Status: DC

## 2015-01-31 NOTE — Progress Notes (Signed)
Patient ID: Virl SonRita A Delano, female   DOB: 02/07/1916, 81 y.o.   MRN: [id number]   [facility]  [facility]  Code Status: DNR Goals of Care: Advanced Directive information Does patient have an advance directive?: Yes, Type of Advance Directive: Healthcare Power of [place];Living will;Out of facility DNR (pink MOST or yellow form), Pre-existing out of facility DNR order (yellow form or pink MOST form): Yellow form placed in chart (order not valid for inpatient use), Does patient want to make changes to advanced directive?: No - Patient declined   Allergies  Allergen Reactions  . Sulfa Antibiotics Shortness Of Breath    Worsens asthma  . Clonidine Derivatives   . Lactose Intolerance (Gi)   . Verapamil     [facility]    Chief Complaint  Patient presents with  . Acute Visit    Possible cellulitus located on her left caff     HPI: Patient is a 81 y.o. white female seen in the office today for an acute visit for possible cellulitis of her left calf.  I've been getting paperwork about her and med refills, but have not seen her since august of last year!  She's been in here and seen other providers several times and has been to the ED with weakness 01/08/15--chronic bradycardia, fatigue.    08/01/14, had right leg car door injury, saw Dr. Forrest MoronGreen--put on doxy, dressed with telfa and ace, then f/u 11/19 with Jessica--improving but ulcer still present.    2/16, had fall with left knee injury, no fx.  Required 10 days of rehab.  While there, tech put his nail through her stocking and caused a skin tear on the left leg.  She was sent home from rehab on abx, had home health.    12/17/14, right leg wound again--vaseline gauze recommended--still healing.  Now better.  01/08/15, ED visit as above, left leg pain was her biggest complaint when she got there but staff had sent her b/c she was not responding to them--her daughter notes short aphasic episodes she thinks may  be TIAs.  Could also be seizures.  Pt does not want to be hospitalized.  Has MOST form that says so.  Review of Systems:  Review of Systems  Constitutional: Positive for malaise/fatigue. Negative for fever and chills.  HENT: Positive for hearing loss.   Eyes: Positive for blurred vision.  Respiratory: Negative for shortness of breath.   Cardiovascular: Positive for leg swelling. Negative for chest pain.  Gastrointestinal: Negative for abdominal pain.  Genitourinary: Negative for dysuria.  Musculoskeletal: Negative for myalgias and falls.  Skin: Positive for rash. Negative for itching.  Neurological: Positive for weakness. Negative for dizziness.  Psychiatric/Behavioral: Positive for depression and memory loss.     Past Medical History  Diagnosis Date  . COPD (chronic obstructive pulmonary disease)   . Hypertension   . Oral aphthae   . Orthostatic hypotension   . Senile osteoporosis   . Atrophy of vulva   . Unspecified urinary incontinence   . Urinary frequency   . Unspecified vitamin D deficiency   . Anxiety state, unspecified   . Restless legs syndrome (RLS)   . Unspecified disorder of kidney and ureter   . Atrioventricular block, unspecified   . Closed dislocation of shoulder, unspecified site   . Anemia, unspecified   . Altered mental status   . Lumbago   . Transient disorder of initiating or maintaining sleep   . Cellulitis and abscess  of unspecified site   . Sebaceous cyst   . Insomnia, unspecified   . Dementia in conditions classified elsewhere without behavioral disturbance   . Depressive disorder, not elsewhere classified   . Abnormality of gait   . Adult failure to thrive   . Headache(784.0)   . Gout, unspecified   . Extrinsic asthma, unspecified   . Chronic kidney disease, stage I   . Osteoarthrosis, unspecified whether generalized or localized, unspecified site   . Spinal stenosis, unspecified region other than cervical   . Edema   . Tachycardia,  unspecified     Past Surgical History  Procedure Laterality Date  . Hip surgery      (R) hip replacement  . Cesarean section    . Abdominal hysterectomy      partial  . Back surgery      fusion  . Breast surgery      bilateral breast reduction  . Eye surgery      bilateral cataract  . Basal cell carcinoma excision    . Basal cell carcinoma excision      face  . Hip closed reduction Right 08/07/2013    Procedure: ATTEMPTED CLOSED MANIPULATION HIP;  Surgeon: Jodi Marbleavid A Thompson, MD;  [facility];  Service: Orthopedics;  Laterality: Right;  . Total hip revision Right 08/08/2013    Procedure: TOTAL HIP REVISION- right;  Surgeon: Velna OchsPeter G Dalldorf, MD;  Location: [facility] OR;  Service: Orthopedics;  Laterality: Right;    Social History:   reports that she has never smoked. She has never used smokeless tobacco. She reports that she does not drink alcohol or use illicit drugs.  Family History  Problem Relation Age of Onset  . Diabetes Mother   . Diabetes Father   . Stroke Sister   . Heart attack Brother     Medications: Patient's Medications  New Prescriptions   No medications on file  Previous Medications   ACETAMINOPHEN (TYLENOL) 325 MG TABLET    Take 650 mg by mouth every 6 (six) hours as needed for mild pain. Every 8 hours as needed for pain   AMLODIPINE (NORVASC) 10 MG TABLET    1 by mouth in the PM for blood pressure   CAPSICUM OLEORESIN (TRIXAICIN) 0.025 % CREAM    Apply to right shoulder and upper arm three times daily   CLONAZEPAM (KLONOPIN) 1 MG TABLET    Take one tablet by mouth at bedtime for anxiety   FUROSEMIDE (LASIX) 20 MG TABLET    Take 2 tablets (40 mg total) by mouth daily. For chf.  May go back to once a day after 3 days.   HYDROCODONE-ACETAMINOPHEN (NORCO/VICODIN) 5-325 MG PER TABLET    1 tab po bid for mild to moderate pain and two tablets po bid for severe pain   LISINOPRIL (PRINIVIL,ZESTRIL) 20 MG TABLET    1 by mouth in the morning for blood pressure    MIRTAZAPINE (REMERON) 7.5 MG TABLET    Take 1 tablet (7.5 mg total) by mouth at bedtime.   POLYETHYL GLYCOL-PROPYL GLYCOL (SYSTANE) 0.4-0.3 % SOLN    Instill one drop into each eye two times daily to relieve dry,itchy eye.   POTASSIUM CHLORIDE SA (K-DUR,KLOR-CON) 20 MEQ TABLET    Take 1 tablet (20 mEq total) by mouth every other day. For chf   SENNA-DOCUSATE (SENOKOT-S) 8.6-50 MG PER TABLET    Take 1 tablet by mouth daily.   TRIAMCINOLONE CREAM (KENALOG) 0.5 %    Apply  1 application topically daily. To rash on legs  Modified Medications   No medications on file  Discontinued Medications   No medications on file     Physical Exam: Filed Vitals:   01/31/15 1355  BP: 98/72  Pulse: 40  Temp: 97.9 F (36.6 C)  TempSrc: Oral  Resp: 20  Height:  (1.422 m)  Weight: 107 lb (48.535 kg)  Physical Exam  Constitutional:  Frail white female here with her daughter  Cardiovascular: Regular rhythm, normal heart sounds and intact distal pulses.   bradycardic  Pulmonary/Chest: Effort normal. She has no rales.  Abdominal: Soft. Bowel sounds are normal.  Neurological: She is alert.  Skin:  Left lower leg with erythema, warmth, tenderness, medial proximal calf venous ulcer with purulent drainage, pink base, some granulation tissue on anterior aspect  Psychiatric: She has a normal mood and affect.     Labs reviewed: Basic Metabolic Panel:  Recent Labs  40/98/11 0842 11/04/14 0500 01/08/15 1723  NA 141 140 145  K 4.4 4.2 4.7  CL 107 107 111  CO2 GLUCOSE 89 97 95  BUN 26* 18 30*  CREATININE 1.00 0.97 1.07  CALCIUM 9.2 9.1 9.4  MG  --   --  2.0  PHOS  --   --  4.2  TSH  --   --  1.378   Liver Function Tests:  Recent Labs  11/03/14 0842 11/04/14 0500 01/08/15 1723  AST ALT ALKPHOS 74 70 73  BILITOT 0.5 1.0 0.5  PROT 6.6 5.9* 6.3  ALBUMIN 3.8 3.5 3.7   No results for input(s): LIPASE, AMYLASE in the last 8760 hours. No results for  input(s): AMMONIA in the last 8760 hours. CBC:  Recent Labs  11/03/14 0842 11/04/14 0500 01/08/15 1723  WBC 8.3 9.0 6.0  NEUTROABS 5.6  --  3.0  HGB 12.1 12.9 11.7*  HCT 36.5 39.2 35.7*  MCV 86.3 86.7 87.1  PLT 222 233 229   Lipid Panel: No results for input(s): CHOL, HDL, LDLCALC, TRIG, CHOLHDL, LDLDIRECT in the last 8760 hours. No results found for: HGBA1C  Past Procedures:  Assessment/Plan 1. Cellulitis of leg, left - due to skin tear that appears to have turned into  - doxycycline (VIBRA-TABS) 100 MG tablet; Take 1 tablet (100 mg total) by mouth 2 (two) times daily.  Dispense: 20 tablet; Refill: 0 - saccharomyces boulardii (FLORASTOR) 250 MG capsule; Take 1 capsule (250 mg total) by mouth 2 (two) times daily.  Dispense: 30 capsule; Refill: 0  2. Venous ulcer of left leg Seems her skin tear and degraded into a venous ulcer Will initially get infection treated and then worry more about compression and healing of the wound -triple abx ointment, then nonadherent dressing, wrap with kerlix, then compression hose -does use the compression hose, but not using unnaboots at present  3. Chronic venous insufficiency -cont compression hose  4. Essential hypertension -bps are variable..low here today, very high at [facility] (?before meds)  5. Bradycardia -chronic AV block -no change, not a candidate for intervention at 81 yo  6. Alzheimer's disease -more agitated today with her daughter than usual--does not feel well  Labs/tests ordered:  none Next appt:  2 wks to reexamine her leg  Guenevere Roorda L. Mirinda Monte, D.O. Geriatrics [facility] [address] [place], [place] [address] Cell Phone (Mon-Fri 8am-5pm):  [phone] On Call:  [phone] & follow prompts  after 5pm & weekends Office Phone:  [phone] Office Fax:  [phone]

## 2015-02-01 DIAGNOSIS — J45909 Unspecified asthma, uncomplicated: Secondary | ICD-10-CM | POA: Diagnosis not present

## 2015-02-01 DIAGNOSIS — M4800 Spinal stenosis, site unspecified: Secondary | ICD-10-CM | POA: Diagnosis not present

## 2015-02-01 DIAGNOSIS — E119 Type 2 diabetes mellitus without complications: Secondary | ICD-10-CM | POA: Diagnosis not present

## 2015-02-01 DIAGNOSIS — I441 Atrioventricular block, second degree: Secondary | ICD-10-CM | POA: Diagnosis not present

## 2015-02-01 DIAGNOSIS — S76822S Laceration of other specified muscles, fascia and tendons at thigh level, left thigh, sequela: Secondary | ICD-10-CM | POA: Diagnosis not present

## 2015-02-01 DIAGNOSIS — J449 Chronic obstructive pulmonary disease, unspecified: Secondary | ICD-10-CM | POA: Diagnosis not present

## 2015-02-01 DIAGNOSIS — Z9181 History of falling: Secondary | ICD-10-CM | POA: Diagnosis not present

## 2015-02-01 DIAGNOSIS — Z96641 Presence of right artificial hip joint: Secondary | ICD-10-CM | POA: Diagnosis not present

## 2015-02-01 DIAGNOSIS — G309 Alzheimer's disease, unspecified: Secondary | ICD-10-CM | POA: Diagnosis not present

## 2015-02-01 DIAGNOSIS — F419 Anxiety disorder, unspecified: Secondary | ICD-10-CM | POA: Diagnosis not present

## 2015-02-01 DIAGNOSIS — M1991 Primary osteoarthritis, unspecified site: Secondary | ICD-10-CM | POA: Diagnosis not present

## 2015-02-01 DIAGNOSIS — N181 Chronic kidney disease, stage 1: Secondary | ICD-10-CM | POA: Diagnosis not present

## 2015-02-01 DIAGNOSIS — M810 Age-related osteoporosis without current pathological fracture: Secondary | ICD-10-CM | POA: Diagnosis not present

## 2015-02-01 DIAGNOSIS — Z4800 Encounter for change or removal of nonsurgical wound dressing: Secondary | ICD-10-CM | POA: Diagnosis not present

## 2015-02-01 DIAGNOSIS — F0280 Dementia in other diseases classified elsewhere without behavioral disturbance: Secondary | ICD-10-CM | POA: Diagnosis not present

## 2015-02-01 DIAGNOSIS — R001 Bradycardia, unspecified: Secondary | ICD-10-CM | POA: Diagnosis not present

## 2015-02-01 DIAGNOSIS — S81811D Laceration without foreign body, right lower leg, subsequent encounter: Secondary | ICD-10-CM | POA: Diagnosis not present

## 2015-02-01 DIAGNOSIS — I129 Hypertensive chronic kidney disease with stage 1 through stage 4 chronic kidney disease, or unspecified chronic kidney disease: Secondary | ICD-10-CM | POA: Diagnosis not present

## 2015-02-04 ENCOUNTER — Ambulatory Visit: Payer: Medicare Other | Admitting: Internal Medicine

## 2015-02-04 DIAGNOSIS — G309 Alzheimer's disease, unspecified: Secondary | ICD-10-CM | POA: Diagnosis not present

## 2015-02-04 DIAGNOSIS — Z4800 Encounter for change or removal of nonsurgical wound dressing: Secondary | ICD-10-CM | POA: Diagnosis not present

## 2015-02-04 DIAGNOSIS — Z9181 History of falling: Secondary | ICD-10-CM | POA: Diagnosis not present

## 2015-02-04 DIAGNOSIS — J45909 Unspecified asthma, uncomplicated: Secondary | ICD-10-CM | POA: Diagnosis not present

## 2015-02-04 DIAGNOSIS — I129 Hypertensive chronic kidney disease with stage 1 through stage 4 chronic kidney disease, or unspecified chronic kidney disease: Secondary | ICD-10-CM | POA: Diagnosis not present

## 2015-02-04 DIAGNOSIS — M1991 Primary osteoarthritis, unspecified site: Secondary | ICD-10-CM | POA: Diagnosis not present

## 2015-02-04 DIAGNOSIS — M4800 Spinal stenosis, site unspecified: Secondary | ICD-10-CM | POA: Diagnosis not present

## 2015-02-04 DIAGNOSIS — E119 Type 2 diabetes mellitus without complications: Secondary | ICD-10-CM | POA: Diagnosis not present

## 2015-02-04 DIAGNOSIS — R001 Bradycardia, unspecified: Secondary | ICD-10-CM | POA: Diagnosis not present

## 2015-02-04 DIAGNOSIS — J449 Chronic obstructive pulmonary disease, unspecified: Secondary | ICD-10-CM | POA: Diagnosis not present

## 2015-02-04 DIAGNOSIS — N181 Chronic kidney disease, stage 1: Secondary | ICD-10-CM | POA: Diagnosis not present

## 2015-02-04 DIAGNOSIS — F0280 Dementia in other diseases classified elsewhere without behavioral disturbance: Secondary | ICD-10-CM | POA: Diagnosis not present

## 2015-02-04 DIAGNOSIS — Z96641 Presence of right artificial hip joint: Secondary | ICD-10-CM | POA: Diagnosis not present

## 2015-02-04 DIAGNOSIS — S76822S Laceration of other specified muscles, fascia and tendons at thigh level, left thigh, sequela: Secondary | ICD-10-CM | POA: Diagnosis not present

## 2015-02-04 DIAGNOSIS — S81811D Laceration without foreign body, right lower leg, subsequent encounter: Secondary | ICD-10-CM | POA: Diagnosis not present

## 2015-02-04 DIAGNOSIS — I441 Atrioventricular block, second degree: Secondary | ICD-10-CM | POA: Diagnosis not present

## 2015-02-04 DIAGNOSIS — M810 Age-related osteoporosis without current pathological fracture: Secondary | ICD-10-CM | POA: Diagnosis not present

## 2015-02-04 DIAGNOSIS — F419 Anxiety disorder, unspecified: Secondary | ICD-10-CM | POA: Diagnosis not present

## 2015-02-06 DIAGNOSIS — N181 Chronic kidney disease, stage 1: Secondary | ICD-10-CM | POA: Diagnosis not present

## 2015-02-06 DIAGNOSIS — S81811D Laceration without foreign body, right lower leg, subsequent encounter: Secondary | ICD-10-CM | POA: Diagnosis not present

## 2015-02-06 DIAGNOSIS — S76822S Laceration of other specified muscles, fascia and tendons at thigh level, left thigh, sequela: Secondary | ICD-10-CM | POA: Diagnosis not present

## 2015-02-06 DIAGNOSIS — J449 Chronic obstructive pulmonary disease, unspecified: Secondary | ICD-10-CM | POA: Diagnosis not present

## 2015-02-06 DIAGNOSIS — F0280 Dementia in other diseases classified elsewhere without behavioral disturbance: Secondary | ICD-10-CM | POA: Diagnosis not present

## 2015-02-06 DIAGNOSIS — Z96641 Presence of right artificial hip joint: Secondary | ICD-10-CM | POA: Diagnosis not present

## 2015-02-06 DIAGNOSIS — Z9181 History of falling: Secondary | ICD-10-CM | POA: Diagnosis not present

## 2015-02-06 DIAGNOSIS — E119 Type 2 diabetes mellitus without complications: Secondary | ICD-10-CM | POA: Diagnosis not present

## 2015-02-06 DIAGNOSIS — F419 Anxiety disorder, unspecified: Secondary | ICD-10-CM | POA: Diagnosis not present

## 2015-02-06 DIAGNOSIS — J45909 Unspecified asthma, uncomplicated: Secondary | ICD-10-CM | POA: Diagnosis not present

## 2015-02-06 DIAGNOSIS — Z4800 Encounter for change or removal of nonsurgical wound dressing: Secondary | ICD-10-CM | POA: Diagnosis not present

## 2015-02-06 DIAGNOSIS — M4800 Spinal stenosis, site unspecified: Secondary | ICD-10-CM | POA: Diagnosis not present

## 2015-02-06 DIAGNOSIS — R001 Bradycardia, unspecified: Secondary | ICD-10-CM | POA: Diagnosis not present

## 2015-02-06 DIAGNOSIS — I441 Atrioventricular block, second degree: Secondary | ICD-10-CM | POA: Diagnosis not present

## 2015-02-06 DIAGNOSIS — G309 Alzheimer's disease, unspecified: Secondary | ICD-10-CM | POA: Diagnosis not present

## 2015-02-06 DIAGNOSIS — I129 Hypertensive chronic kidney disease with stage 1 through stage 4 chronic kidney disease, or unspecified chronic kidney disease: Secondary | ICD-10-CM | POA: Diagnosis not present

## 2015-02-06 DIAGNOSIS — M810 Age-related osteoporosis without current pathological fracture: Secondary | ICD-10-CM | POA: Diagnosis not present

## 2015-02-06 DIAGNOSIS — M1991 Primary osteoarthritis, unspecified site: Secondary | ICD-10-CM | POA: Diagnosis not present

## 2015-02-08 DIAGNOSIS — I129 Hypertensive chronic kidney disease with stage 1 through stage 4 chronic kidney disease, or unspecified chronic kidney disease: Secondary | ICD-10-CM | POA: Diagnosis not present

## 2015-02-08 DIAGNOSIS — Z9181 History of falling: Secondary | ICD-10-CM | POA: Diagnosis not present

## 2015-02-08 DIAGNOSIS — M1991 Primary osteoarthritis, unspecified site: Secondary | ICD-10-CM | POA: Diagnosis not present

## 2015-02-08 DIAGNOSIS — J449 Chronic obstructive pulmonary disease, unspecified: Secondary | ICD-10-CM | POA: Diagnosis not present

## 2015-02-08 DIAGNOSIS — J45909 Unspecified asthma, uncomplicated: Secondary | ICD-10-CM | POA: Diagnosis not present

## 2015-02-08 DIAGNOSIS — Z96641 Presence of right artificial hip joint: Secondary | ICD-10-CM | POA: Diagnosis not present

## 2015-02-08 DIAGNOSIS — M810 Age-related osteoporosis without current pathological fracture: Secondary | ICD-10-CM | POA: Diagnosis not present

## 2015-02-08 DIAGNOSIS — E119 Type 2 diabetes mellitus without complications: Secondary | ICD-10-CM | POA: Diagnosis not present

## 2015-02-08 DIAGNOSIS — F419 Anxiety disorder, unspecified: Secondary | ICD-10-CM | POA: Diagnosis not present

## 2015-02-08 DIAGNOSIS — Z4800 Encounter for change or removal of nonsurgical wound dressing: Secondary | ICD-10-CM | POA: Diagnosis not present

## 2015-02-08 DIAGNOSIS — R001 Bradycardia, unspecified: Secondary | ICD-10-CM | POA: Diagnosis not present

## 2015-02-08 DIAGNOSIS — S76822S Laceration of other specified muscles, fascia and tendons at thigh level, left thigh, sequela: Secondary | ICD-10-CM | POA: Diagnosis not present

## 2015-02-08 DIAGNOSIS — F0280 Dementia in other diseases classified elsewhere without behavioral disturbance: Secondary | ICD-10-CM | POA: Diagnosis not present

## 2015-02-08 DIAGNOSIS — S81811D Laceration without foreign body, right lower leg, subsequent encounter: Secondary | ICD-10-CM | POA: Diagnosis not present

## 2015-02-08 DIAGNOSIS — N181 Chronic kidney disease, stage 1: Secondary | ICD-10-CM | POA: Diagnosis not present

## 2015-02-08 DIAGNOSIS — I441 Atrioventricular block, second degree: Secondary | ICD-10-CM | POA: Diagnosis not present

## 2015-02-08 DIAGNOSIS — G309 Alzheimer's disease, unspecified: Secondary | ICD-10-CM | POA: Diagnosis not present

## 2015-02-08 DIAGNOSIS — M4800 Spinal stenosis, site unspecified: Secondary | ICD-10-CM | POA: Diagnosis not present

## 2015-02-12 DIAGNOSIS — M810 Age-related osteoporosis without current pathological fracture: Secondary | ICD-10-CM | POA: Diagnosis not present

## 2015-02-12 DIAGNOSIS — R001 Bradycardia, unspecified: Secondary | ICD-10-CM | POA: Diagnosis not present

## 2015-02-12 DIAGNOSIS — F419 Anxiety disorder, unspecified: Secondary | ICD-10-CM | POA: Diagnosis not present

## 2015-02-12 DIAGNOSIS — I441 Atrioventricular block, second degree: Secondary | ICD-10-CM | POA: Diagnosis not present

## 2015-02-12 DIAGNOSIS — N181 Chronic kidney disease, stage 1: Secondary | ICD-10-CM | POA: Diagnosis not present

## 2015-02-12 DIAGNOSIS — J449 Chronic obstructive pulmonary disease, unspecified: Secondary | ICD-10-CM | POA: Diagnosis not present

## 2015-02-12 DIAGNOSIS — Z9181 History of falling: Secondary | ICD-10-CM | POA: Diagnosis not present

## 2015-02-12 DIAGNOSIS — M4800 Spinal stenosis, site unspecified: Secondary | ICD-10-CM | POA: Diagnosis not present

## 2015-02-12 DIAGNOSIS — M1991 Primary osteoarthritis, unspecified site: Secondary | ICD-10-CM | POA: Diagnosis not present

## 2015-02-12 DIAGNOSIS — F0280 Dementia in other diseases classified elsewhere without behavioral disturbance: Secondary | ICD-10-CM | POA: Diagnosis not present

## 2015-02-12 DIAGNOSIS — E119 Type 2 diabetes mellitus without complications: Secondary | ICD-10-CM | POA: Diagnosis not present

## 2015-02-12 DIAGNOSIS — S76822S Laceration of other specified muscles, fascia and tendons at thigh level, left thigh, sequela: Secondary | ICD-10-CM | POA: Diagnosis not present

## 2015-02-12 DIAGNOSIS — Z96641 Presence of right artificial hip joint: Secondary | ICD-10-CM | POA: Diagnosis not present

## 2015-02-12 DIAGNOSIS — I129 Hypertensive chronic kidney disease with stage 1 through stage 4 chronic kidney disease, or unspecified chronic kidney disease: Secondary | ICD-10-CM | POA: Diagnosis not present

## 2015-02-12 DIAGNOSIS — S81811D Laceration without foreign body, right lower leg, subsequent encounter: Secondary | ICD-10-CM | POA: Diagnosis not present

## 2015-02-12 DIAGNOSIS — J45909 Unspecified asthma, uncomplicated: Secondary | ICD-10-CM | POA: Diagnosis not present

## 2015-02-12 DIAGNOSIS — Z4800 Encounter for change or removal of nonsurgical wound dressing: Secondary | ICD-10-CM | POA: Diagnosis not present

## 2015-02-12 DIAGNOSIS — G309 Alzheimer's disease, unspecified: Secondary | ICD-10-CM | POA: Diagnosis not present

## 2015-02-13 DIAGNOSIS — R001 Bradycardia, unspecified: Secondary | ICD-10-CM | POA: Diagnosis not present

## 2015-02-13 DIAGNOSIS — E119 Type 2 diabetes mellitus without complications: Secondary | ICD-10-CM | POA: Diagnosis not present

## 2015-02-13 DIAGNOSIS — M4800 Spinal stenosis, site unspecified: Secondary | ICD-10-CM | POA: Diagnosis not present

## 2015-02-13 DIAGNOSIS — J45909 Unspecified asthma, uncomplicated: Secondary | ICD-10-CM | POA: Diagnosis not present

## 2015-02-13 DIAGNOSIS — Z9181 History of falling: Secondary | ICD-10-CM | POA: Diagnosis not present

## 2015-02-13 DIAGNOSIS — Z4800 Encounter for change or removal of nonsurgical wound dressing: Secondary | ICD-10-CM | POA: Diagnosis not present

## 2015-02-13 DIAGNOSIS — I129 Hypertensive chronic kidney disease with stage 1 through stage 4 chronic kidney disease, or unspecified chronic kidney disease: Secondary | ICD-10-CM | POA: Diagnosis not present

## 2015-02-13 DIAGNOSIS — N181 Chronic kidney disease, stage 1: Secondary | ICD-10-CM | POA: Diagnosis not present

## 2015-02-13 DIAGNOSIS — G309 Alzheimer's disease, unspecified: Secondary | ICD-10-CM | POA: Diagnosis not present

## 2015-02-13 DIAGNOSIS — I441 Atrioventricular block, second degree: Secondary | ICD-10-CM | POA: Diagnosis not present

## 2015-02-13 DIAGNOSIS — J449 Chronic obstructive pulmonary disease, unspecified: Secondary | ICD-10-CM | POA: Diagnosis not present

## 2015-02-13 DIAGNOSIS — S81811D Laceration without foreign body, right lower leg, subsequent encounter: Secondary | ICD-10-CM | POA: Diagnosis not present

## 2015-02-13 DIAGNOSIS — Z96641 Presence of right artificial hip joint: Secondary | ICD-10-CM | POA: Diagnosis not present

## 2015-02-13 DIAGNOSIS — F0280 Dementia in other diseases classified elsewhere without behavioral disturbance: Secondary | ICD-10-CM | POA: Diagnosis not present

## 2015-02-13 DIAGNOSIS — M1991 Primary osteoarthritis, unspecified site: Secondary | ICD-10-CM | POA: Diagnosis not present

## 2015-02-13 DIAGNOSIS — S76822S Laceration of other specified muscles, fascia and tendons at thigh level, left thigh, sequela: Secondary | ICD-10-CM | POA: Diagnosis not present

## 2015-02-13 DIAGNOSIS — F419 Anxiety disorder, unspecified: Secondary | ICD-10-CM | POA: Diagnosis not present

## 2015-02-13 DIAGNOSIS — M810 Age-related osteoporosis without current pathological fracture: Secondary | ICD-10-CM | POA: Diagnosis not present

## 2015-02-14 ENCOUNTER — Ambulatory Visit: Payer: Medicare Other | Admitting: Internal Medicine

## 2015-02-14 ENCOUNTER — Encounter: Payer: Self-pay | Admitting: Internal Medicine

## 2015-02-14 DIAGNOSIS — M810 Age-related osteoporosis without current pathological fracture: Secondary | ICD-10-CM | POA: Diagnosis not present

## 2015-02-14 DIAGNOSIS — F419 Anxiety disorder, unspecified: Secondary | ICD-10-CM | POA: Diagnosis not present

## 2015-02-14 DIAGNOSIS — S76822S Laceration of other specified muscles, fascia and tendons at thigh level, left thigh, sequela: Secondary | ICD-10-CM | POA: Diagnosis not present

## 2015-02-14 DIAGNOSIS — I441 Atrioventricular block, second degree: Secondary | ICD-10-CM | POA: Diagnosis not present

## 2015-02-14 DIAGNOSIS — I129 Hypertensive chronic kidney disease with stage 1 through stage 4 chronic kidney disease, or unspecified chronic kidney disease: Secondary | ICD-10-CM | POA: Diagnosis not present

## 2015-02-14 DIAGNOSIS — F0280 Dementia in other diseases classified elsewhere without behavioral disturbance: Secondary | ICD-10-CM | POA: Diagnosis not present

## 2015-02-14 DIAGNOSIS — M4800 Spinal stenosis, site unspecified: Secondary | ICD-10-CM | POA: Diagnosis not present

## 2015-02-14 DIAGNOSIS — Z4800 Encounter for change or removal of nonsurgical wound dressing: Secondary | ICD-10-CM | POA: Diagnosis not present

## 2015-02-14 DIAGNOSIS — M1991 Primary osteoarthritis, unspecified site: Secondary | ICD-10-CM | POA: Diagnosis not present

## 2015-02-14 DIAGNOSIS — J449 Chronic obstructive pulmonary disease, unspecified: Secondary | ICD-10-CM | POA: Diagnosis not present

## 2015-02-14 DIAGNOSIS — E119 Type 2 diabetes mellitus without complications: Secondary | ICD-10-CM | POA: Diagnosis not present

## 2015-02-14 DIAGNOSIS — G309 Alzheimer's disease, unspecified: Secondary | ICD-10-CM | POA: Diagnosis not present

## 2015-02-14 DIAGNOSIS — S81811D Laceration without foreign body, right lower leg, subsequent encounter: Secondary | ICD-10-CM | POA: Diagnosis not present

## 2015-02-14 DIAGNOSIS — R001 Bradycardia, unspecified: Secondary | ICD-10-CM | POA: Diagnosis not present

## 2015-02-14 DIAGNOSIS — Z9181 History of falling: Secondary | ICD-10-CM | POA: Diagnosis not present

## 2015-02-14 DIAGNOSIS — J45909 Unspecified asthma, uncomplicated: Secondary | ICD-10-CM | POA: Diagnosis not present

## 2015-02-14 DIAGNOSIS — N181 Chronic kidney disease, stage 1: Secondary | ICD-10-CM | POA: Diagnosis not present

## 2015-02-14 DIAGNOSIS — Z96641 Presence of right artificial hip joint: Secondary | ICD-10-CM | POA: Diagnosis not present

## 2015-02-15 DIAGNOSIS — M810 Age-related osteoporosis without current pathological fracture: Secondary | ICD-10-CM | POA: Diagnosis not present

## 2015-02-15 DIAGNOSIS — I441 Atrioventricular block, second degree: Secondary | ICD-10-CM | POA: Diagnosis not present

## 2015-02-15 DIAGNOSIS — J45909 Unspecified asthma, uncomplicated: Secondary | ICD-10-CM | POA: Diagnosis not present

## 2015-02-15 DIAGNOSIS — M4800 Spinal stenosis, site unspecified: Secondary | ICD-10-CM | POA: Diagnosis not present

## 2015-02-15 DIAGNOSIS — S81811D Laceration without foreign body, right lower leg, subsequent encounter: Secondary | ICD-10-CM | POA: Diagnosis not present

## 2015-02-15 DIAGNOSIS — E119 Type 2 diabetes mellitus without complications: Secondary | ICD-10-CM | POA: Diagnosis not present

## 2015-02-15 DIAGNOSIS — J449 Chronic obstructive pulmonary disease, unspecified: Secondary | ICD-10-CM | POA: Diagnosis not present

## 2015-02-15 DIAGNOSIS — S76822S Laceration of other specified muscles, fascia and tendons at thigh level, left thigh, sequela: Secondary | ICD-10-CM | POA: Diagnosis not present

## 2015-02-15 DIAGNOSIS — N181 Chronic kidney disease, stage 1: Secondary | ICD-10-CM | POA: Diagnosis not present

## 2015-02-15 DIAGNOSIS — M1991 Primary osteoarthritis, unspecified site: Secondary | ICD-10-CM | POA: Diagnosis not present

## 2015-02-15 DIAGNOSIS — F419 Anxiety disorder, unspecified: Secondary | ICD-10-CM | POA: Diagnosis not present

## 2015-02-15 DIAGNOSIS — Z9181 History of falling: Secondary | ICD-10-CM | POA: Diagnosis not present

## 2015-02-15 DIAGNOSIS — R001 Bradycardia, unspecified: Secondary | ICD-10-CM | POA: Diagnosis not present

## 2015-02-15 DIAGNOSIS — I129 Hypertensive chronic kidney disease with stage 1 through stage 4 chronic kidney disease, or unspecified chronic kidney disease: Secondary | ICD-10-CM | POA: Diagnosis not present

## 2015-02-15 DIAGNOSIS — F0280 Dementia in other diseases classified elsewhere without behavioral disturbance: Secondary | ICD-10-CM | POA: Diagnosis not present

## 2015-02-15 DIAGNOSIS — Z4800 Encounter for change or removal of nonsurgical wound dressing: Secondary | ICD-10-CM | POA: Diagnosis not present

## 2015-02-15 DIAGNOSIS — G309 Alzheimer's disease, unspecified: Secondary | ICD-10-CM | POA: Diagnosis not present

## 2015-02-15 DIAGNOSIS — Z96641 Presence of right artificial hip joint: Secondary | ICD-10-CM | POA: Diagnosis not present

## 2015-02-19 DIAGNOSIS — J45909 Unspecified asthma, uncomplicated: Secondary | ICD-10-CM | POA: Diagnosis not present

## 2015-02-19 DIAGNOSIS — J449 Chronic obstructive pulmonary disease, unspecified: Secondary | ICD-10-CM | POA: Diagnosis not present

## 2015-02-19 DIAGNOSIS — S76822S Laceration of other specified muscles, fascia and tendons at thigh level, left thigh, sequela: Secondary | ICD-10-CM | POA: Diagnosis not present

## 2015-02-19 DIAGNOSIS — Z96641 Presence of right artificial hip joint: Secondary | ICD-10-CM | POA: Diagnosis not present

## 2015-02-19 DIAGNOSIS — I441 Atrioventricular block, second degree: Secondary | ICD-10-CM | POA: Diagnosis not present

## 2015-02-19 DIAGNOSIS — N181 Chronic kidney disease, stage 1: Secondary | ICD-10-CM | POA: Diagnosis not present

## 2015-02-19 DIAGNOSIS — F0280 Dementia in other diseases classified elsewhere without behavioral disturbance: Secondary | ICD-10-CM | POA: Diagnosis not present

## 2015-02-19 DIAGNOSIS — M1991 Primary osteoarthritis, unspecified site: Secondary | ICD-10-CM | POA: Diagnosis not present

## 2015-02-19 DIAGNOSIS — I129 Hypertensive chronic kidney disease with stage 1 through stage 4 chronic kidney disease, or unspecified chronic kidney disease: Secondary | ICD-10-CM | POA: Diagnosis not present

## 2015-02-19 DIAGNOSIS — G309 Alzheimer's disease, unspecified: Secondary | ICD-10-CM | POA: Diagnosis not present

## 2015-02-19 DIAGNOSIS — E119 Type 2 diabetes mellitus without complications: Secondary | ICD-10-CM | POA: Diagnosis not present

## 2015-02-19 DIAGNOSIS — S81811D Laceration without foreign body, right lower leg, subsequent encounter: Secondary | ICD-10-CM | POA: Diagnosis not present

## 2015-02-19 DIAGNOSIS — M4800 Spinal stenosis, site unspecified: Secondary | ICD-10-CM | POA: Diagnosis not present

## 2015-02-19 DIAGNOSIS — F419 Anxiety disorder, unspecified: Secondary | ICD-10-CM | POA: Diagnosis not present

## 2015-02-19 DIAGNOSIS — R001 Bradycardia, unspecified: Secondary | ICD-10-CM | POA: Diagnosis not present

## 2015-02-19 DIAGNOSIS — Z9181 History of falling: Secondary | ICD-10-CM | POA: Diagnosis not present

## 2015-02-19 DIAGNOSIS — M810 Age-related osteoporosis without current pathological fracture: Secondary | ICD-10-CM | POA: Diagnosis not present

## 2015-02-19 DIAGNOSIS — Z4800 Encounter for change or removal of nonsurgical wound dressing: Secondary | ICD-10-CM | POA: Diagnosis not present

## 2015-02-20 DIAGNOSIS — R001 Bradycardia, unspecified: Secondary | ICD-10-CM | POA: Diagnosis not present

## 2015-02-20 DIAGNOSIS — M810 Age-related osteoporosis without current pathological fracture: Secondary | ICD-10-CM | POA: Diagnosis not present

## 2015-02-20 DIAGNOSIS — J449 Chronic obstructive pulmonary disease, unspecified: Secondary | ICD-10-CM | POA: Diagnosis not present

## 2015-02-20 DIAGNOSIS — G309 Alzheimer's disease, unspecified: Secondary | ICD-10-CM | POA: Diagnosis not present

## 2015-02-20 DIAGNOSIS — I129 Hypertensive chronic kidney disease with stage 1 through stage 4 chronic kidney disease, or unspecified chronic kidney disease: Secondary | ICD-10-CM | POA: Diagnosis not present

## 2015-02-20 DIAGNOSIS — I441 Atrioventricular block, second degree: Secondary | ICD-10-CM | POA: Diagnosis not present

## 2015-02-20 DIAGNOSIS — Z4800 Encounter for change or removal of nonsurgical wound dressing: Secondary | ICD-10-CM | POA: Diagnosis not present

## 2015-02-20 DIAGNOSIS — S81811D Laceration without foreign body, right lower leg, subsequent encounter: Secondary | ICD-10-CM | POA: Diagnosis not present

## 2015-02-20 DIAGNOSIS — F0280 Dementia in other diseases classified elsewhere without behavioral disturbance: Secondary | ICD-10-CM | POA: Diagnosis not present

## 2015-02-20 DIAGNOSIS — Z96641 Presence of right artificial hip joint: Secondary | ICD-10-CM | POA: Diagnosis not present

## 2015-02-20 DIAGNOSIS — J45909 Unspecified asthma, uncomplicated: Secondary | ICD-10-CM | POA: Diagnosis not present

## 2015-02-20 DIAGNOSIS — F419 Anxiety disorder, unspecified: Secondary | ICD-10-CM | POA: Diagnosis not present

## 2015-02-20 DIAGNOSIS — S76822S Laceration of other specified muscles, fascia and tendons at thigh level, left thigh, sequela: Secondary | ICD-10-CM | POA: Diagnosis not present

## 2015-02-20 DIAGNOSIS — Z9181 History of falling: Secondary | ICD-10-CM | POA: Diagnosis not present

## 2015-02-20 DIAGNOSIS — M1991 Primary osteoarthritis, unspecified site: Secondary | ICD-10-CM | POA: Diagnosis not present

## 2015-02-20 DIAGNOSIS — E119 Type 2 diabetes mellitus without complications: Secondary | ICD-10-CM | POA: Diagnosis not present

## 2015-02-20 DIAGNOSIS — M4800 Spinal stenosis, site unspecified: Secondary | ICD-10-CM | POA: Diagnosis not present

## 2015-02-20 DIAGNOSIS — N181 Chronic kidney disease, stage 1: Secondary | ICD-10-CM | POA: Diagnosis not present

## 2015-02-21 ENCOUNTER — Ambulatory Visit (INDEPENDENT_AMBULATORY_CARE_PROVIDER_SITE_OTHER): Payer: Medicare Other | Admitting: Nurse Practitioner

## 2015-02-21 ENCOUNTER — Encounter: Payer: Self-pay | Admitting: Nurse Practitioner

## 2015-02-21 VITALS — BP 110/58 | HR 40 | Temp 97.7°F | Resp 18 | Ht <= 58 in | Wt 104.4 lb

## 2015-02-21 DIAGNOSIS — L97929 Non-pressure chronic ulcer of unspecified part of left lower leg with unspecified severity: Secondary | ICD-10-CM

## 2015-02-21 DIAGNOSIS — J45909 Unspecified asthma, uncomplicated: Secondary | ICD-10-CM | POA: Diagnosis not present

## 2015-02-21 DIAGNOSIS — I441 Atrioventricular block, second degree: Secondary | ICD-10-CM | POA: Diagnosis not present

## 2015-02-21 DIAGNOSIS — M4800 Spinal stenosis, site unspecified: Secondary | ICD-10-CM | POA: Diagnosis not present

## 2015-02-21 DIAGNOSIS — Z4800 Encounter for change or removal of nonsurgical wound dressing: Secondary | ICD-10-CM | POA: Diagnosis not present

## 2015-02-21 DIAGNOSIS — L03116 Cellulitis of left lower limb: Secondary | ICD-10-CM

## 2015-02-21 DIAGNOSIS — S81811D Laceration without foreign body, right lower leg, subsequent encounter: Secondary | ICD-10-CM | POA: Diagnosis not present

## 2015-02-21 DIAGNOSIS — F419 Anxiety disorder, unspecified: Secondary | ICD-10-CM | POA: Diagnosis not present

## 2015-02-21 DIAGNOSIS — I87312 Chronic venous hypertension (idiopathic) with ulcer of left lower extremity: Secondary | ICD-10-CM

## 2015-02-21 DIAGNOSIS — M810 Age-related osteoporosis without current pathological fracture: Secondary | ICD-10-CM | POA: Diagnosis not present

## 2015-02-21 DIAGNOSIS — M1991 Primary osteoarthritis, unspecified site: Secondary | ICD-10-CM | POA: Diagnosis not present

## 2015-02-21 DIAGNOSIS — G309 Alzheimer's disease, unspecified: Secondary | ICD-10-CM | POA: Diagnosis not present

## 2015-02-21 DIAGNOSIS — I83029 Varicose veins of left lower extremity with ulcer of unspecified site: Secondary | ICD-10-CM

## 2015-02-21 DIAGNOSIS — I129 Hypertensive chronic kidney disease with stage 1 through stage 4 chronic kidney disease, or unspecified chronic kidney disease: Secondary | ICD-10-CM | POA: Diagnosis not present

## 2015-02-21 DIAGNOSIS — Z9181 History of falling: Secondary | ICD-10-CM | POA: Diagnosis not present

## 2015-02-21 DIAGNOSIS — F0280 Dementia in other diseases classified elsewhere without behavioral disturbance: Secondary | ICD-10-CM | POA: Diagnosis not present

## 2015-02-21 DIAGNOSIS — E119 Type 2 diabetes mellitus without complications: Secondary | ICD-10-CM | POA: Diagnosis not present

## 2015-02-21 DIAGNOSIS — N181 Chronic kidney disease, stage 1: Secondary | ICD-10-CM | POA: Diagnosis not present

## 2015-02-21 DIAGNOSIS — R001 Bradycardia, unspecified: Secondary | ICD-10-CM | POA: Diagnosis not present

## 2015-02-21 DIAGNOSIS — Z96641 Presence of right artificial hip joint: Secondary | ICD-10-CM | POA: Diagnosis not present

## 2015-02-21 DIAGNOSIS — S76822S Laceration of other specified muscles, fascia and tendons at thigh level, left thigh, sequela: Secondary | ICD-10-CM | POA: Diagnosis not present

## 2015-02-21 DIAGNOSIS — J449 Chronic obstructive pulmonary disease, unspecified: Secondary | ICD-10-CM | POA: Diagnosis not present

## 2015-02-21 MED ORDER — DOXYCYCLINE HYCLATE 100 MG PO TABS: 100.0000 mg | ORAL_TABLET | Freq: Two times a day (BID) | ORAL | Status: DC

## 2015-02-21 MED ORDER — SACCHAROMYCES BOULARDII 250 MG PO CAPS: 250.0000 mg | ORAL_CAPSULE | Freq: Two times a day (BID) | ORAL | Status: DC

## 2015-02-21 NOTE — Patient Instructions (Signed)
Okay to start dressing changes per treatment RN at facility Doxycyline 100 mg and florastor 250 mg twice daily for 10 days  Follow up in 2 weeks

## 2015-02-21 NOTE — Progress Notes (Signed)
Patient ID: [name], female   DOB: 02/07/1916, 81 y.o.   MRN: [id number]    PCP: Bufford SpikesEED, TIFFANY, DO  Allergies  Allergen Reactions  . Sulfa Antibiotics Shortness Of Breath    Worsens asthma  . Clonidine Derivatives   . Lactose Intolerance (Gi)   . Verapamil     [facility]    Chief Complaint  Patient presents with  . Medical Management of Chronic Issues    left leg recheck     HPI: Patient is a 81 y.o. female seen in the office today to evaluate leg wound. Pt of Dr Renato Gailseed with left leg cellulitis and venous ulcer. Redness still surrounding wound and now with increased yellow drainage odor and redness. Tenderness noted to leg Daughter reports it was better now worse.  RN was following wound at [facility] and 3 days ago tech started doing wound care and now area has increased yellow drainage, treatment RN would like to start following again and doing a different dressing.  No fevers or chills.  Review of Systems:  Review of Systems  Constitutional: Negative for fever and chills.  HENT: Positive for hearing loss.   Respiratory: Negative for shortness of breath.   Cardiovascular: Positive for leg swelling. Negative for chest pain.  Gastrointestinal: Negative for abdominal pain.  Genitourinary: Negative for dysuria.  Musculoskeletal: Negative for myalgias.  Skin: Positive for wound.       Left leg wound  Neurological: Positive for weakness. Negative for dizziness.    Past Medical History  Diagnosis Date  . COPD (chronic obstructive pulmonary disease)   . Hypertension   . Oral aphthae   . Orthostatic hypotension   . Senile osteoporosis   . Atrophy of vulva   . Unspecified urinary incontinence   . Urinary frequency   . Unspecified vitamin D deficiency   . Anxiety state, unspecified   . Restless legs syndrome (RLS)   . Unspecified disorder of kidney and ureter   . Atrioventricular block, unspecified   . Closed dislocation of shoulder, unspecified site   . Anemia,  unspecified   . Altered mental status   . Lumbago   . Transient disorder of initiating or maintaining sleep   . Cellulitis and abscess of unspecified site   . Sebaceous cyst   . Insomnia, unspecified   . Dementia in conditions classified elsewhere without behavioral disturbance   . Depressive disorder, not elsewhere classified   . Abnormality of gait   . Adult failure to thrive   . Headache(784.0)   . Gout, unspecified   . Extrinsic asthma, unspecified   . Chronic kidney disease, stage I   . Osteoarthrosis, unspecified whether generalized or localized, unspecified site   . Spinal stenosis, unspecified region other than cervical   . Edema   . Tachycardia, unspecified    Past Surgical History  Procedure Laterality Date  . Hip surgery      (R) hip replacement  . Cesarean section    . Abdominal hysterectomy      partial  . Back surgery      fusion  . Breast surgery      bilateral breast reduction  . Eye surgery      bilateral cataract  . Basal cell carcinoma excision    . Basal cell carcinoma excision      face  . Hip closed reduction Right 08/07/2013    Procedure: ATTEMPTED CLOSED MANIPULATION HIP;  Surgeon: Jodi Marbleavid A Thompson, MD;  [facility];  Service:  Orthopedics;  Laterality: Right;  . Total hip revision Right 08/08/2013    Procedure: TOTAL HIP REVISION- right;  Surgeon: Velna Ochs, MD;  Location: [facility] OR;  Service: Orthopedics;  Laterality: Right;   Social History:   reports that she has never smoked. She has never used smokeless tobacco. She reports that she does not drink alcohol or use illicit drugs.  Family History  Problem Relation Age of Onset  . Diabetes Mother   . Diabetes Father   . Stroke Sister   . Heart attack Brother     Medications: Patient's Medications  New Prescriptions   No medications on file  Previous Medications   ACETAMINOPHEN (TYLENOL) 325 MG TABLET    Take 650 mg by mouth every 6 (six) hours as needed for mild pain. Every 8  hours as needed for pain   AMLODIPINE (NORVASC) 10 MG TABLET    1 by mouth in the PM for blood pressure   CAPSICUM OLEORESIN (TRIXAICIN) 0.025 % CREAM    Apply to right shoulder and upper arm three times daily   CHOLECALCIFEROL (VITAMIN D) 1000 UNITS TABLET    Take 1,000 Units by mouth daily.   CLONAZEPAM (KLONOPIN) 1 MG TABLET    Take one tablet by mouth at bedtime for anxiety   FUROSEMIDE (LASIX) 20 MG TABLET    Take 2 tablets (40 mg total) by mouth daily. For chf.  May go back to once a day after 3 days.   HYDROCODONE-ACETAMINOPHEN (NORCO/VICODIN) 5-325 MG PER TABLET    1 tab po bid for mild to moderate pain and two tablets po bid for severe pain   LISINOPRIL (PRINIVIL,ZESTRIL) 20 MG TABLET    1 by mouth in the morning for blood pressure   MIRTAZAPINE (REMERON) 7.5 MG TABLET    Take 1 tablet (7.5 mg total) by mouth at bedtime.   POLYETHYL GLYCOL-PROPYL GLYCOL (SYSTANE) 0.4-0.3 % SOLN    Instill one drop into each eye two times daily to relieve dry,itchy eye.   POTASSIUM CHLORIDE SA (K-DUR,KLOR-CON) 20 MEQ TABLET    Take 1 tablet (20 mEq total) by mouth every other day. For chf   SENNA-DOCUSATE (SENOKOT-S) 8.6-50 MG PER TABLET    Take 1 tablet by mouth daily.   TRIAMCINOLONE CREAM (KENALOG) 0.5 %    Apply 1 application topically daily. To rash on legs  Modified Medications   No medications on file  Discontinued Medications   DOXYCYCLINE (VIBRA-TABS) 100 MG TABLET    Take 1 tablet (100 mg total) by mouth 2 (two) times daily.   SACCHAROMYCES BOULARDII (FLORASTOR) 250 MG CAPSULE    Take 1 capsule (250 mg total) by mouth 2 (two) times daily.     Physical Exam:  Filed Vitals:   02/21/15 1522  BP: 110/58  Pulse: 40  Temp: 97.7 F (36.5 C)  TempSrc: Oral  Resp: 18  Height:  (1.422 m)  Weight: 104 lb 6.4 oz (47.356 kg)  SpO2: 93%    Physical Exam  Constitutional:  Frail white female   Cardiovascular: Regular rhythm, normal heart sounds and intact distal pulses.   bradycardic    Pulmonary/Chest: Effort normal. She has no rales.  Abdominal: Soft. Bowel sounds are normal.  Neurological: She is alert.  Skin:  Left lower leg with erythema, warmth, tenderness, medial proximal calf venous ulcer, yellow base, min drainage on dressing  Psychiatric: She has a normal mood and affect.    Labs reviewed: Basic Metabolic Panel:  Recent Labs  11/03/14 0842 11/04/14 0500 01/08/15 1723  NA 141 140 145  K 4.4 4.2 4.7  CL 107 107 111  CO2 GLUCOSE 89 97 95  BUN 26* 18 30*  CREATININE 1.00 0.97 1.07  CALCIUM 9.2 9.1 9.4  MG  --   --  2.0  PHOS  --   --  4.2  TSH  --   --  1.378   Liver Function Tests:  Recent Labs  11/03/14 0842 11/04/14 0500 01/08/15 1723  AST ALT ALKPHOS 74 70 73  BILITOT 0.5 1.0 0.5  PROT 6.6 5.9* 6.3  ALBUMIN 3.8 3.5 3.7   No results for input(s): LIPASE, AMYLASE in the last 8760 hours. No results for input(s): AMMONIA in the last 8760 hours. CBC:  Recent Labs  11/03/14 0842 11/04/14 0500 01/08/15 1723  WBC 8.3 9.0 6.0  NEUTROABS 5.6  --  3.0  HGB 12.1 12.9 11.7*  HCT 36.5 39.2 35.7*  MCV 86.3 86.7 87.1  PLT 222 233 229   Lipid Panel: No results for input(s): CHOL, HDL, LDLCALC, TRIG, CHOLHDL, LDLDIRECT in the last 8760 hours. TSH:  Recent Labs  01/08/15 1723  TSH 1.378   A1C: No results found for: HGBA1C   Assessment/Plan  1. Cellulitis of leg, left -increased drainage to left leg wound that has gotten worse of over the last 3 days, will give another round of doxycyline  - doxycycline (VIBRA-TABS) 100 MG tablet; Take 1 tablet (100 mg total) by mouth 2 (two) times daily.  Dispense: 20 tablet; Refill: 0 - saccharomyces boulardii (FLORASTOR) 250 MG capsule; Take 1 capsule (250 mg total) by mouth 2 (two) times daily.  Dispense: 20 capsule; Refill: 0  2. Venous ulcer of left leg -ongoing, from skin tear that is slow healing.  Nursing to follow until completely healed  -will okay  request to apply calcium alligate with gentle border twice weekly.  To follow up in 2 weeks

## 2015-02-26 DIAGNOSIS — S81811D Laceration without foreign body, right lower leg, subsequent encounter: Secondary | ICD-10-CM | POA: Diagnosis not present

## 2015-02-26 DIAGNOSIS — F419 Anxiety disorder, unspecified: Secondary | ICD-10-CM | POA: Diagnosis not present

## 2015-02-26 DIAGNOSIS — I129 Hypertensive chronic kidney disease with stage 1 through stage 4 chronic kidney disease, or unspecified chronic kidney disease: Secondary | ICD-10-CM | POA: Diagnosis not present

## 2015-02-26 DIAGNOSIS — R001 Bradycardia, unspecified: Secondary | ICD-10-CM | POA: Diagnosis not present

## 2015-02-26 DIAGNOSIS — E119 Type 2 diabetes mellitus without complications: Secondary | ICD-10-CM | POA: Diagnosis not present

## 2015-02-26 DIAGNOSIS — I441 Atrioventricular block, second degree: Secondary | ICD-10-CM | POA: Diagnosis not present

## 2015-02-26 DIAGNOSIS — M810 Age-related osteoporosis without current pathological fracture: Secondary | ICD-10-CM | POA: Diagnosis not present

## 2015-02-26 DIAGNOSIS — S76822S Laceration of other specified muscles, fascia and tendons at thigh level, left thigh, sequela: Secondary | ICD-10-CM | POA: Diagnosis not present

## 2015-02-26 DIAGNOSIS — N181 Chronic kidney disease, stage 1: Secondary | ICD-10-CM | POA: Diagnosis not present

## 2015-02-26 DIAGNOSIS — Z96641 Presence of right artificial hip joint: Secondary | ICD-10-CM | POA: Diagnosis not present

## 2015-02-26 DIAGNOSIS — M1991 Primary osteoarthritis, unspecified site: Secondary | ICD-10-CM | POA: Diagnosis not present

## 2015-02-26 DIAGNOSIS — Z4800 Encounter for change or removal of nonsurgical wound dressing: Secondary | ICD-10-CM | POA: Diagnosis not present

## 2015-02-26 DIAGNOSIS — M4800 Spinal stenosis, site unspecified: Secondary | ICD-10-CM | POA: Diagnosis not present

## 2015-02-26 DIAGNOSIS — F0280 Dementia in other diseases classified elsewhere without behavioral disturbance: Secondary | ICD-10-CM | POA: Diagnosis not present

## 2015-02-26 DIAGNOSIS — G309 Alzheimer's disease, unspecified: Secondary | ICD-10-CM | POA: Diagnosis not present

## 2015-02-26 DIAGNOSIS — J45909 Unspecified asthma, uncomplicated: Secondary | ICD-10-CM | POA: Diagnosis not present

## 2015-02-26 DIAGNOSIS — Z9181 History of falling: Secondary | ICD-10-CM | POA: Diagnosis not present

## 2015-02-26 DIAGNOSIS — J449 Chronic obstructive pulmonary disease, unspecified: Secondary | ICD-10-CM | POA: Diagnosis not present

## 2015-02-28 DIAGNOSIS — J45909 Unspecified asthma, uncomplicated: Secondary | ICD-10-CM | POA: Diagnosis not present

## 2015-02-28 DIAGNOSIS — F0280 Dementia in other diseases classified elsewhere without behavioral disturbance: Secondary | ICD-10-CM | POA: Diagnosis not present

## 2015-02-28 DIAGNOSIS — S81811D Laceration without foreign body, right lower leg, subsequent encounter: Secondary | ICD-10-CM | POA: Diagnosis not present

## 2015-02-28 DIAGNOSIS — I441 Atrioventricular block, second degree: Secondary | ICD-10-CM | POA: Diagnosis not present

## 2015-02-28 DIAGNOSIS — Z4800 Encounter for change or removal of nonsurgical wound dressing: Secondary | ICD-10-CM | POA: Diagnosis not present

## 2015-02-28 DIAGNOSIS — M4800 Spinal stenosis, site unspecified: Secondary | ICD-10-CM | POA: Diagnosis not present

## 2015-02-28 DIAGNOSIS — G309 Alzheimer's disease, unspecified: Secondary | ICD-10-CM | POA: Diagnosis not present

## 2015-02-28 DIAGNOSIS — M1991 Primary osteoarthritis, unspecified site: Secondary | ICD-10-CM | POA: Diagnosis not present

## 2015-02-28 DIAGNOSIS — I129 Hypertensive chronic kidney disease with stage 1 through stage 4 chronic kidney disease, or unspecified chronic kidney disease: Secondary | ICD-10-CM | POA: Diagnosis not present

## 2015-02-28 DIAGNOSIS — J449 Chronic obstructive pulmonary disease, unspecified: Secondary | ICD-10-CM | POA: Diagnosis not present

## 2015-02-28 DIAGNOSIS — Z96641 Presence of right artificial hip joint: Secondary | ICD-10-CM | POA: Diagnosis not present

## 2015-02-28 DIAGNOSIS — R001 Bradycardia, unspecified: Secondary | ICD-10-CM | POA: Diagnosis not present

## 2015-02-28 DIAGNOSIS — M810 Age-related osteoporosis without current pathological fracture: Secondary | ICD-10-CM | POA: Diagnosis not present

## 2015-02-28 DIAGNOSIS — Z9181 History of falling: Secondary | ICD-10-CM | POA: Diagnosis not present

## 2015-02-28 DIAGNOSIS — N181 Chronic kidney disease, stage 1: Secondary | ICD-10-CM | POA: Diagnosis not present

## 2015-02-28 DIAGNOSIS — E119 Type 2 diabetes mellitus without complications: Secondary | ICD-10-CM | POA: Diagnosis not present

## 2015-02-28 DIAGNOSIS — F419 Anxiety disorder, unspecified: Secondary | ICD-10-CM | POA: Diagnosis not present

## 2015-02-28 DIAGNOSIS — S76822S Laceration of other specified muscles, fascia and tendons at thigh level, left thigh, sequela: Secondary | ICD-10-CM | POA: Diagnosis not present

## 2015-03-04 ENCOUNTER — Other Ambulatory Visit: Payer: Self-pay | Admitting: *Deleted

## 2015-03-04 DIAGNOSIS — T148XXA Other injury of unspecified body region, initial encounter: Secondary | ICD-10-CM

## 2015-03-04 MED ORDER — HYDROCODONE-ACETAMINOPHEN 5-325 MG PO TABS: ORAL_TABLET | ORAL | Status: DC

## 2015-03-04 NOTE — Telephone Encounter (Signed)
[facility]

## 2015-03-05 ENCOUNTER — Ambulatory Visit (INDEPENDENT_AMBULATORY_CARE_PROVIDER_SITE_OTHER): Payer: Medicare Other | Admitting: Nurse Practitioner

## 2015-03-05 ENCOUNTER — Encounter: Payer: Self-pay | Admitting: Nurse Practitioner

## 2015-03-05 VITALS — BP 124/76 | HR 37 | Temp 97.9°F | Resp 20 | Ht <= 58 in | Wt 105.0 lb

## 2015-03-05 DIAGNOSIS — J45909 Unspecified asthma, uncomplicated: Secondary | ICD-10-CM | POA: Diagnosis not present

## 2015-03-05 DIAGNOSIS — M1991 Primary osteoarthritis, unspecified site: Secondary | ICD-10-CM | POA: Diagnosis not present

## 2015-03-05 DIAGNOSIS — R001 Bradycardia, unspecified: Secondary | ICD-10-CM | POA: Diagnosis not present

## 2015-03-05 DIAGNOSIS — L03116 Cellulitis of left lower limb: Secondary | ICD-10-CM

## 2015-03-05 DIAGNOSIS — E119 Type 2 diabetes mellitus without complications: Secondary | ICD-10-CM | POA: Diagnosis not present

## 2015-03-05 DIAGNOSIS — I10 Essential (primary) hypertension: Secondary | ICD-10-CM

## 2015-03-05 DIAGNOSIS — M4800 Spinal stenosis, site unspecified: Secondary | ICD-10-CM | POA: Diagnosis not present

## 2015-03-05 DIAGNOSIS — Z4800 Encounter for change or removal of nonsurgical wound dressing: Secondary | ICD-10-CM | POA: Diagnosis not present

## 2015-03-05 DIAGNOSIS — I441 Atrioventricular block, second degree: Secondary | ICD-10-CM | POA: Diagnosis not present

## 2015-03-05 DIAGNOSIS — S81811D Laceration without foreign body, right lower leg, subsequent encounter: Secondary | ICD-10-CM | POA: Diagnosis not present

## 2015-03-05 DIAGNOSIS — Z96641 Presence of right artificial hip joint: Secondary | ICD-10-CM | POA: Diagnosis not present

## 2015-03-05 DIAGNOSIS — J449 Chronic obstructive pulmonary disease, unspecified: Secondary | ICD-10-CM | POA: Diagnosis not present

## 2015-03-05 DIAGNOSIS — L97929 Non-pressure chronic ulcer of unspecified part of left lower leg with unspecified severity: Secondary | ICD-10-CM

## 2015-03-05 DIAGNOSIS — S76822S Laceration of other specified muscles, fascia and tendons at thigh level, left thigh, sequela: Secondary | ICD-10-CM | POA: Diagnosis not present

## 2015-03-05 DIAGNOSIS — F0280 Dementia in other diseases classified elsewhere without behavioral disturbance: Secondary | ICD-10-CM | POA: Diagnosis not present

## 2015-03-05 DIAGNOSIS — I129 Hypertensive chronic kidney disease with stage 1 through stage 4 chronic kidney disease, or unspecified chronic kidney disease: Secondary | ICD-10-CM | POA: Diagnosis not present

## 2015-03-05 DIAGNOSIS — F419 Anxiety disorder, unspecified: Secondary | ICD-10-CM | POA: Diagnosis not present

## 2015-03-05 DIAGNOSIS — Z9181 History of falling: Secondary | ICD-10-CM | POA: Diagnosis not present

## 2015-03-05 DIAGNOSIS — G309 Alzheimer's disease, unspecified: Secondary | ICD-10-CM | POA: Diagnosis not present

## 2015-03-05 DIAGNOSIS — I87312 Chronic venous hypertension (idiopathic) with ulcer of left lower extremity: Secondary | ICD-10-CM | POA: Diagnosis not present

## 2015-03-05 DIAGNOSIS — I83029 Varicose veins of left lower extremity with ulcer of unspecified site: Secondary | ICD-10-CM

## 2015-03-05 DIAGNOSIS — M810 Age-related osteoporosis without current pathological fracture: Secondary | ICD-10-CM | POA: Diagnosis not present

## 2015-03-05 DIAGNOSIS — N181 Chronic kidney disease, stage 1: Secondary | ICD-10-CM | POA: Diagnosis not present

## 2015-03-05 MED ORDER — FUROSEMIDE 20 MG PO TABS: 20.0000 mg | ORAL_TABLET | ORAL | Status: AC

## 2015-03-05 NOTE — Progress Notes (Signed)
Patient ID: [name], female   DOB: 08-Oct-1915, 81 y.o.   MRN: [id number]    PCP: Bufford Spikes, DO  Allergies  Allergen Reactions  . Sulfa Antibiotics Shortness Of Breath    Worsens asthma  . Clonidine Derivatives   . Lactose Intolerance (Gi)   . Verapamil     [facility] MAR    Chief Complaint  Patient presents with  . Follow-up    2 week follow-up     HPI: Patient is a 81 y.o. female seen in the office today to check leg wound. Pt of Dr Renato Gails with left leg cellulitis and venous ulcer.  Here with daughter. Redness has improved. Pt reports leg is not nearly as painful Doing ted hose which in combination with lasix has helped with swelling.   Review of Systems:  Review of Systems  Constitutional: Negative for fever and chills.  HENT: Positive for hearing loss.   Respiratory: Negative for shortness of breath.   Cardiovascular: Negative for chest pain and leg swelling.  Gastrointestinal: Negative for abdominal pain.  Genitourinary: Negative for dysuria.  Musculoskeletal: Negative for myalgias.  Skin: Positive for wound.       Left leg wound  Neurological: Positive for weakness. Negative for dizziness.    Past Medical History  Diagnosis Date  . COPD (chronic obstructive pulmonary disease)   . Hypertension   . Oral aphthae   . Orthostatic hypotension   . Senile osteoporosis   . Atrophy of vulva   . Unspecified urinary incontinence   . Urinary frequency   . Unspecified vitamin D deficiency   . Anxiety state, unspecified   . Restless legs syndrome (RLS)   . Unspecified disorder of kidney and ureter   . Atrioventricular block, unspecified   . Closed dislocation of shoulder, unspecified site   . Anemia, unspecified   . Altered mental status   . Lumbago   . Transient disorder of initiating or maintaining sleep   . Cellulitis and abscess of unspecified site   . Sebaceous cyst   . Insomnia, unspecified   . Dementia in conditions classified elsewhere without behavioral  disturbance   . Depressive disorder, not elsewhere classified   . Abnormality of gait   . Adult failure to thrive   . Headache(784.0)   . Gout, unspecified   . Extrinsic asthma, unspecified   . Chronic kidney disease, stage I   . Osteoarthrosis, unspecified whether generalized or localized, unspecified site   . Spinal stenosis, unspecified region other than cervical   . Edema   . Tachycardia, unspecified    Past Surgical History  Procedure Laterality Date  . Hip surgery      (R) hip replacement  . Cesarean section    . Abdominal hysterectomy      partial  . Back surgery      fusion  . Breast surgery      bilateral breast reduction  . Eye surgery      bilateral cataract  . Basal cell carcinoma excision    . Basal cell carcinoma excision      face  . Hip closed reduction Right 08/07/2013    Procedure: ATTEMPTED CLOSED MANIPULATION HIP;  Surgeon: Jodi Marble, MD;  Location: [facility] OR;  Service: Orthopedics;  Laterality: Right;  . Total hip revision Right 08/08/2013    Procedure: TOTAL HIP REVISION- right;  Surgeon: Velna Ochs, MD;  Location: [facility] OR;  Service: Orthopedics;  Laterality: Right;   Social History:   reports  that she has never smoked. She has never used smokeless tobacco. She reports that she does not drink alcohol or use illicit drugs.  Family History  Problem Relation Age of Onset  . Diabetes Mother   . Diabetes Father   . Stroke Sister   . Heart attack Brother     Medications: Patient's Medications  New Prescriptions   No medications on file  Previous Medications   ACETAMINOPHEN (TYLENOL) 325 MG TABLET    Take 650 mg by mouth every 6 (six) hours as needed for mild pain. Every 8 hours as needed for pain   CAPSICUM OLEORESIN (TRIXAICIN) 0.025 % CREAM    Apply to right shoulder and upper arm three times daily   CHOLECALCIFEROL (VITAMIN D) 1000 UNITS TABLET    Take 1,000 Units by mouth daily.   CLONAZEPAM (KLONOPIN) 1 MG TABLET    Take one tablet by  mouth at bedtime for anxiety   FUROSEMIDE (LASIX) 20 MG TABLET    Take 2 tablets (40 mg total) by mouth daily. For chf.  May go back to once a day after 3 days.   HYDROCODONE-ACETAMINOPHEN (NORCO/VICODIN) 5-325 MG PER TABLET    Take one to two tablets by mouth twice daily for pain   LISINOPRIL (PRINIVIL,ZESTRIL) 40 MG TABLET    Take 40 mg by mouth daily.   MIRTAZAPINE (REMERON) 7.5 MG TABLET    Take 1 tablet (7.5 mg total) by mouth at bedtime.   POLYETHYL GLYCOL-PROPYL GLYCOL (SYSTANE) 0.4-0.3 % SOLN    Instill one drop into each eye two times daily to relieve dry,itchy eye.   POTASSIUM CHLORIDE SA (K-DUR,KLOR-CON) 20 MEQ TABLET    Take 1 tablet (20 mEq total) by mouth every other day. For chf   SENNA-DOCUSATE (SENOKOT-S) 8.6-50 MG PER TABLET    Take 1 tablet by mouth daily.  Modified Medications   No medications on file  Discontinued Medications   AMLODIPINE (NORVASC) 10 MG TABLET    1 by mouth in the PM for blood pressure   DOXYCYCLINE (VIBRA-TABS) 100 MG TABLET    Take 1 tablet (100 mg total) by mouth 2 (two) times daily.   LISINOPRIL (PRINIVIL,ZESTRIL) 20 MG TABLET    1 by mouth in the morning for blood pressure   SACCHAROMYCES BOULARDII (FLORASTOR) 250 MG CAPSULE    Take 1 capsule (250 mg total) by mouth 2 (two) times daily.   TRIAMCINOLONE CREAM (KENALOG) 0.5 %    Apply 1 application topically daily. To rash on legs     Physical Exam:  Filed Vitals:   03/05/15 1544  BP: 124/76  Pulse: 37  Temp: 97.9 F (36.6 C)  TempSrc: Oral  Resp: 20  Height: 4\' 8"  (1.422 m)  Weight: 105 lb (47.628 kg)  SpO2: 96%    Physical Exam  Constitutional:  Frail white female   Cardiovascular: Regular rhythm, normal heart sounds and intact distal pulses.   bradycardic  Pulmonary/Chest: Effort normal. She has no rales.  Abdominal: Soft. Bowel sounds are normal.  Neurological: She is alert.  Skin:  Left lower leg with medial proximal calf venous ulcer, yellow base, min drainage on dressing    Psychiatric: She has a normal mood and affect.    Labs reviewed: Basic Metabolic Panel:  Recent Labs  45/40/9801/03/13 0842 11/04/14 0500 01/08/15 1723  NA 141 140 145  K 4.4 4.2 4.7  CL 107 107 111  CO2 24 25 25   GLUCOSE 89 97 95  BUN 26* 18 30*  CREATININE 1.00 0.97 1.07  CALCIUM 9.2 9.1 9.4  MG  --   --  2.0  PHOS  --   --  4.2  TSH  --   --  1.378   Liver Function Tests:  Recent Labs  11/03/14 0842 11/04/14 0500 01/08/15 1723  AST ALT ALKPHOS 74 70 73  BILITOT 0.5 1.0 0.5  PROT 6.6 5.9* 6.3  ALBUMIN 3.8 3.5 3.7   No results for input(s): LIPASE, AMYLASE in the last 8760 hours. No results for input(s): AMMONIA in the last 8760 hours. CBC:  Recent Labs  11/03/14 0842 11/04/14 0500 01/08/15 1723  WBC 8.3 9.0 6.0  NEUTROABS 5.6  --  3.0  HGB 12.1 12.9 11.7*  HCT 36.5 39.2 35.7*  MCV 86.3 86.7 87.1  PLT 222 233 229   Lipid Panel: No results for input(s): CHOL, HDL, LDLCALC, TRIG, CHOLHDL, LDLDIRECT in the last 8760 hours. TSH:  Recent Labs  01/08/15 1723  TSH 1.378   A1C: No results found for: HGBA1C   Assessment/Plan  1. Cellulitis of leg, left -has completed doxycyline, leg redness and tenderness has significantly improved  2. Venous ulcer of left leg -ongoing, wound is improving with treatment nurse following and applying calcium alligate with gentle border twice weekly. To cont this treatment. To notify if wound worsens, increased drainage or redness occurs   3. Edema -improved with TED hose and lasix, to continue current regimen - furosemide (LASIX) 20 MG tablet; Take 1 tablet (20 mg total) by mouth every other day.  Dispense: 45 tablet; Refill: 3   To keep follow up in 4 weeks with Dr Renato Gails

## 2015-03-08 DIAGNOSIS — F0280 Dementia in other diseases classified elsewhere without behavioral disturbance: Secondary | ICD-10-CM | POA: Diagnosis not present

## 2015-03-08 DIAGNOSIS — Z4800 Encounter for change or removal of nonsurgical wound dressing: Secondary | ICD-10-CM | POA: Diagnosis not present

## 2015-03-08 DIAGNOSIS — M4800 Spinal stenosis, site unspecified: Secondary | ICD-10-CM | POA: Diagnosis not present

## 2015-03-08 DIAGNOSIS — G309 Alzheimer's disease, unspecified: Secondary | ICD-10-CM | POA: Diagnosis not present

## 2015-03-08 DIAGNOSIS — R001 Bradycardia, unspecified: Secondary | ICD-10-CM | POA: Diagnosis not present

## 2015-03-08 DIAGNOSIS — Z96641 Presence of right artificial hip joint: Secondary | ICD-10-CM | POA: Diagnosis not present

## 2015-03-08 DIAGNOSIS — N181 Chronic kidney disease, stage 1: Secondary | ICD-10-CM | POA: Diagnosis not present

## 2015-03-08 DIAGNOSIS — S81811D Laceration without foreign body, right lower leg, subsequent encounter: Secondary | ICD-10-CM | POA: Diagnosis not present

## 2015-03-08 DIAGNOSIS — I129 Hypertensive chronic kidney disease with stage 1 through stage 4 chronic kidney disease, or unspecified chronic kidney disease: Secondary | ICD-10-CM | POA: Diagnosis not present

## 2015-03-08 DIAGNOSIS — S76822S Laceration of other specified muscles, fascia and tendons at thigh level, left thigh, sequela: Secondary | ICD-10-CM | POA: Diagnosis not present

## 2015-03-08 DIAGNOSIS — E119 Type 2 diabetes mellitus without complications: Secondary | ICD-10-CM | POA: Diagnosis not present

## 2015-03-08 DIAGNOSIS — M810 Age-related osteoporosis without current pathological fracture: Secondary | ICD-10-CM | POA: Diagnosis not present

## 2015-03-08 DIAGNOSIS — M1991 Primary osteoarthritis, unspecified site: Secondary | ICD-10-CM | POA: Diagnosis not present

## 2015-03-08 DIAGNOSIS — J45909 Unspecified asthma, uncomplicated: Secondary | ICD-10-CM | POA: Diagnosis not present

## 2015-03-08 DIAGNOSIS — Z9181 History of falling: Secondary | ICD-10-CM | POA: Diagnosis not present

## 2015-03-08 DIAGNOSIS — F419 Anxiety disorder, unspecified: Secondary | ICD-10-CM | POA: Diagnosis not present

## 2015-03-08 DIAGNOSIS — I441 Atrioventricular block, second degree: Secondary | ICD-10-CM | POA: Diagnosis not present

## 2015-03-08 DIAGNOSIS — J449 Chronic obstructive pulmonary disease, unspecified: Secondary | ICD-10-CM | POA: Diagnosis not present

## 2015-03-12 DIAGNOSIS — M810 Age-related osteoporosis without current pathological fracture: Secondary | ICD-10-CM | POA: Diagnosis not present

## 2015-03-12 DIAGNOSIS — I441 Atrioventricular block, second degree: Secondary | ICD-10-CM | POA: Diagnosis not present

## 2015-03-12 DIAGNOSIS — Z9181 History of falling: Secondary | ICD-10-CM | POA: Diagnosis not present

## 2015-03-12 DIAGNOSIS — I129 Hypertensive chronic kidney disease with stage 1 through stage 4 chronic kidney disease, or unspecified chronic kidney disease: Secondary | ICD-10-CM | POA: Diagnosis not present

## 2015-03-12 DIAGNOSIS — S81811D Laceration without foreign body, right lower leg, subsequent encounter: Secondary | ICD-10-CM | POA: Diagnosis not present

## 2015-03-12 DIAGNOSIS — E119 Type 2 diabetes mellitus without complications: Secondary | ICD-10-CM | POA: Diagnosis not present

## 2015-03-12 DIAGNOSIS — S76822S Laceration of other specified muscles, fascia and tendons at thigh level, left thigh, sequela: Secondary | ICD-10-CM | POA: Diagnosis not present

## 2015-03-12 DIAGNOSIS — J45909 Unspecified asthma, uncomplicated: Secondary | ICD-10-CM | POA: Diagnosis not present

## 2015-03-12 DIAGNOSIS — F419 Anxiety disorder, unspecified: Secondary | ICD-10-CM | POA: Diagnosis not present

## 2015-03-12 DIAGNOSIS — R001 Bradycardia, unspecified: Secondary | ICD-10-CM | POA: Diagnosis not present

## 2015-03-12 DIAGNOSIS — Z4800 Encounter for change or removal of nonsurgical wound dressing: Secondary | ICD-10-CM | POA: Diagnosis not present

## 2015-03-12 DIAGNOSIS — M4800 Spinal stenosis, site unspecified: Secondary | ICD-10-CM | POA: Diagnosis not present

## 2015-03-12 DIAGNOSIS — F0280 Dementia in other diseases classified elsewhere without behavioral disturbance: Secondary | ICD-10-CM | POA: Diagnosis not present

## 2015-03-12 DIAGNOSIS — J449 Chronic obstructive pulmonary disease, unspecified: Secondary | ICD-10-CM | POA: Diagnosis not present

## 2015-03-12 DIAGNOSIS — N181 Chronic kidney disease, stage 1: Secondary | ICD-10-CM | POA: Diagnosis not present

## 2015-03-12 DIAGNOSIS — M1991 Primary osteoarthritis, unspecified site: Secondary | ICD-10-CM | POA: Diagnosis not present

## 2015-03-12 DIAGNOSIS — G309 Alzheimer's disease, unspecified: Secondary | ICD-10-CM | POA: Diagnosis not present

## 2015-03-12 DIAGNOSIS — Z96641 Presence of right artificial hip joint: Secondary | ICD-10-CM | POA: Diagnosis not present

## 2015-03-14 DIAGNOSIS — Z9181 History of falling: Secondary | ICD-10-CM | POA: Diagnosis not present

## 2015-03-14 DIAGNOSIS — M1991 Primary osteoarthritis, unspecified site: Secondary | ICD-10-CM | POA: Diagnosis not present

## 2015-03-14 DIAGNOSIS — Z4800 Encounter for change or removal of nonsurgical wound dressing: Secondary | ICD-10-CM | POA: Diagnosis not present

## 2015-03-14 DIAGNOSIS — M810 Age-related osteoporosis without current pathological fracture: Secondary | ICD-10-CM | POA: Diagnosis not present

## 2015-03-14 DIAGNOSIS — N181 Chronic kidney disease, stage 1: Secondary | ICD-10-CM | POA: Diagnosis not present

## 2015-03-14 DIAGNOSIS — I441 Atrioventricular block, second degree: Secondary | ICD-10-CM | POA: Diagnosis not present

## 2015-03-14 DIAGNOSIS — I129 Hypertensive chronic kidney disease with stage 1 through stage 4 chronic kidney disease, or unspecified chronic kidney disease: Secondary | ICD-10-CM | POA: Diagnosis not present

## 2015-03-14 DIAGNOSIS — S81811D Laceration without foreign body, right lower leg, subsequent encounter: Secondary | ICD-10-CM | POA: Diagnosis not present

## 2015-03-14 DIAGNOSIS — Z96641 Presence of right artificial hip joint: Secondary | ICD-10-CM | POA: Diagnosis not present

## 2015-03-14 DIAGNOSIS — J449 Chronic obstructive pulmonary disease, unspecified: Secondary | ICD-10-CM | POA: Diagnosis not present

## 2015-03-14 DIAGNOSIS — F0280 Dementia in other diseases classified elsewhere without behavioral disturbance: Secondary | ICD-10-CM | POA: Diagnosis not present

## 2015-03-14 DIAGNOSIS — F419 Anxiety disorder, unspecified: Secondary | ICD-10-CM | POA: Diagnosis not present

## 2015-03-14 DIAGNOSIS — G309 Alzheimer's disease, unspecified: Secondary | ICD-10-CM | POA: Diagnosis not present

## 2015-03-14 DIAGNOSIS — J45909 Unspecified asthma, uncomplicated: Secondary | ICD-10-CM | POA: Diagnosis not present

## 2015-03-14 DIAGNOSIS — R001 Bradycardia, unspecified: Secondary | ICD-10-CM | POA: Diagnosis not present

## 2015-03-14 DIAGNOSIS — E119 Type 2 diabetes mellitus without complications: Secondary | ICD-10-CM | POA: Diagnosis not present

## 2015-03-14 DIAGNOSIS — S76822S Laceration of other specified muscles, fascia and tendons at thigh level, left thigh, sequela: Secondary | ICD-10-CM | POA: Diagnosis not present

## 2015-03-14 DIAGNOSIS — M4800 Spinal stenosis, site unspecified: Secondary | ICD-10-CM | POA: Diagnosis not present

## 2015-03-19 DIAGNOSIS — G309 Alzheimer's disease, unspecified: Secondary | ICD-10-CM | POA: Diagnosis not present

## 2015-03-19 DIAGNOSIS — Z4800 Encounter for change or removal of nonsurgical wound dressing: Secondary | ICD-10-CM | POA: Diagnosis not present

## 2015-03-19 DIAGNOSIS — M1991 Primary osteoarthritis, unspecified site: Secondary | ICD-10-CM | POA: Diagnosis not present

## 2015-03-19 DIAGNOSIS — Z96641 Presence of right artificial hip joint: Secondary | ICD-10-CM | POA: Diagnosis not present

## 2015-03-19 DIAGNOSIS — J45909 Unspecified asthma, uncomplicated: Secondary | ICD-10-CM | POA: Diagnosis not present

## 2015-03-19 DIAGNOSIS — I441 Atrioventricular block, second degree: Secondary | ICD-10-CM | POA: Diagnosis not present

## 2015-03-19 DIAGNOSIS — I129 Hypertensive chronic kidney disease with stage 1 through stage 4 chronic kidney disease, or unspecified chronic kidney disease: Secondary | ICD-10-CM | POA: Diagnosis not present

## 2015-03-19 DIAGNOSIS — F0280 Dementia in other diseases classified elsewhere without behavioral disturbance: Secondary | ICD-10-CM | POA: Diagnosis not present

## 2015-03-19 DIAGNOSIS — E119 Type 2 diabetes mellitus without complications: Secondary | ICD-10-CM | POA: Diagnosis not present

## 2015-03-19 DIAGNOSIS — J449 Chronic obstructive pulmonary disease, unspecified: Secondary | ICD-10-CM | POA: Diagnosis not present

## 2015-03-19 DIAGNOSIS — F419 Anxiety disorder, unspecified: Secondary | ICD-10-CM | POA: Diagnosis not present

## 2015-03-19 DIAGNOSIS — S76822S Laceration of other specified muscles, fascia and tendons at thigh level, left thigh, sequela: Secondary | ICD-10-CM | POA: Diagnosis not present

## 2015-03-19 DIAGNOSIS — Z9181 History of falling: Secondary | ICD-10-CM | POA: Diagnosis not present

## 2015-03-19 DIAGNOSIS — M4800 Spinal stenosis, site unspecified: Secondary | ICD-10-CM | POA: Diagnosis not present

## 2015-03-19 DIAGNOSIS — M810 Age-related osteoporosis without current pathological fracture: Secondary | ICD-10-CM | POA: Diagnosis not present

## 2015-03-19 DIAGNOSIS — R001 Bradycardia, unspecified: Secondary | ICD-10-CM | POA: Diagnosis not present

## 2015-03-19 DIAGNOSIS — N181 Chronic kidney disease, stage 1: Secondary | ICD-10-CM | POA: Diagnosis not present

## 2015-03-19 DIAGNOSIS — S81811D Laceration without foreign body, right lower leg, subsequent encounter: Secondary | ICD-10-CM | POA: Diagnosis not present

## 2015-03-21 DIAGNOSIS — J45909 Unspecified asthma, uncomplicated: Secondary | ICD-10-CM | POA: Diagnosis not present

## 2015-03-21 DIAGNOSIS — M4800 Spinal stenosis, site unspecified: Secondary | ICD-10-CM | POA: Diagnosis not present

## 2015-03-21 DIAGNOSIS — J449 Chronic obstructive pulmonary disease, unspecified: Secondary | ICD-10-CM | POA: Diagnosis not present

## 2015-03-21 DIAGNOSIS — Z9181 History of falling: Secondary | ICD-10-CM | POA: Diagnosis not present

## 2015-03-21 DIAGNOSIS — N181 Chronic kidney disease, stage 1: Secondary | ICD-10-CM | POA: Diagnosis not present

## 2015-03-21 DIAGNOSIS — M810 Age-related osteoporosis without current pathological fracture: Secondary | ICD-10-CM | POA: Diagnosis not present

## 2015-03-21 DIAGNOSIS — F0280 Dementia in other diseases classified elsewhere without behavioral disturbance: Secondary | ICD-10-CM | POA: Diagnosis not present

## 2015-03-21 DIAGNOSIS — E119 Type 2 diabetes mellitus without complications: Secondary | ICD-10-CM | POA: Diagnosis not present

## 2015-03-21 DIAGNOSIS — R001 Bradycardia, unspecified: Secondary | ICD-10-CM | POA: Diagnosis not present

## 2015-03-21 DIAGNOSIS — Z96641 Presence of right artificial hip joint: Secondary | ICD-10-CM | POA: Diagnosis not present

## 2015-03-21 DIAGNOSIS — F419 Anxiety disorder, unspecified: Secondary | ICD-10-CM | POA: Diagnosis not present

## 2015-03-21 DIAGNOSIS — Z4800 Encounter for change or removal of nonsurgical wound dressing: Secondary | ICD-10-CM | POA: Diagnosis not present

## 2015-03-21 DIAGNOSIS — I129 Hypertensive chronic kidney disease with stage 1 through stage 4 chronic kidney disease, or unspecified chronic kidney disease: Secondary | ICD-10-CM | POA: Diagnosis not present

## 2015-03-21 DIAGNOSIS — S81811D Laceration without foreign body, right lower leg, subsequent encounter: Secondary | ICD-10-CM | POA: Diagnosis not present

## 2015-03-21 DIAGNOSIS — I441 Atrioventricular block, second degree: Secondary | ICD-10-CM | POA: Diagnosis not present

## 2015-03-21 DIAGNOSIS — S76822S Laceration of other specified muscles, fascia and tendons at thigh level, left thigh, sequela: Secondary | ICD-10-CM | POA: Diagnosis not present

## 2015-03-21 DIAGNOSIS — M1991 Primary osteoarthritis, unspecified site: Secondary | ICD-10-CM | POA: Diagnosis not present

## 2015-03-21 DIAGNOSIS — G309 Alzheimer's disease, unspecified: Secondary | ICD-10-CM | POA: Diagnosis not present

## 2015-03-23 DIAGNOSIS — I441 Atrioventricular block, second degree: Secondary | ICD-10-CM | POA: Diagnosis not present

## 2015-03-23 DIAGNOSIS — S81811D Laceration without foreign body, right lower leg, subsequent encounter: Secondary | ICD-10-CM | POA: Diagnosis not present

## 2015-03-23 DIAGNOSIS — E119 Type 2 diabetes mellitus without complications: Secondary | ICD-10-CM | POA: Diagnosis not present

## 2015-03-23 DIAGNOSIS — I872 Venous insufficiency (chronic) (peripheral): Secondary | ICD-10-CM | POA: Diagnosis not present

## 2015-03-28 DIAGNOSIS — I129 Hypertensive chronic kidney disease with stage 1 through stage 4 chronic kidney disease, or unspecified chronic kidney disease: Secondary | ICD-10-CM | POA: Diagnosis not present

## 2015-03-28 DIAGNOSIS — F0280 Dementia in other diseases classified elsewhere without behavioral disturbance: Secondary | ICD-10-CM | POA: Diagnosis not present

## 2015-03-28 DIAGNOSIS — M1991 Primary osteoarthritis, unspecified site: Secondary | ICD-10-CM | POA: Diagnosis not present

## 2015-03-28 DIAGNOSIS — S76822S Laceration of other specified muscles, fascia and tendons at thigh level, left thigh, sequela: Secondary | ICD-10-CM | POA: Diagnosis not present

## 2015-03-28 DIAGNOSIS — G309 Alzheimer's disease, unspecified: Secondary | ICD-10-CM | POA: Diagnosis not present

## 2015-03-28 DIAGNOSIS — J45909 Unspecified asthma, uncomplicated: Secondary | ICD-10-CM | POA: Diagnosis not present

## 2015-03-28 DIAGNOSIS — J449 Chronic obstructive pulmonary disease, unspecified: Secondary | ICD-10-CM | POA: Diagnosis not present

## 2015-03-28 DIAGNOSIS — M810 Age-related osteoporosis without current pathological fracture: Secondary | ICD-10-CM | POA: Diagnosis not present

## 2015-03-28 DIAGNOSIS — N181 Chronic kidney disease, stage 1: Secondary | ICD-10-CM | POA: Diagnosis not present

## 2015-03-28 DIAGNOSIS — S81811D Laceration without foreign body, right lower leg, subsequent encounter: Secondary | ICD-10-CM | POA: Diagnosis not present

## 2015-03-28 DIAGNOSIS — E119 Type 2 diabetes mellitus without complications: Secondary | ICD-10-CM | POA: Diagnosis not present

## 2015-03-28 DIAGNOSIS — M4800 Spinal stenosis, site unspecified: Secondary | ICD-10-CM | POA: Diagnosis not present

## 2015-03-28 DIAGNOSIS — R001 Bradycardia, unspecified: Secondary | ICD-10-CM | POA: Diagnosis not present

## 2015-03-28 DIAGNOSIS — Z4800 Encounter for change or removal of nonsurgical wound dressing: Secondary | ICD-10-CM | POA: Diagnosis not present

## 2015-03-28 DIAGNOSIS — Z9181 History of falling: Secondary | ICD-10-CM | POA: Diagnosis not present

## 2015-03-28 DIAGNOSIS — I441 Atrioventricular block, second degree: Secondary | ICD-10-CM | POA: Diagnosis not present

## 2015-03-28 DIAGNOSIS — Z96641 Presence of right artificial hip joint: Secondary | ICD-10-CM | POA: Diagnosis not present

## 2015-03-28 DIAGNOSIS — F419 Anxiety disorder, unspecified: Secondary | ICD-10-CM | POA: Diagnosis not present

## 2015-04-02 DIAGNOSIS — J449 Chronic obstructive pulmonary disease, unspecified: Secondary | ICD-10-CM | POA: Diagnosis not present

## 2015-04-02 DIAGNOSIS — I872 Venous insufficiency (chronic) (peripheral): Secondary | ICD-10-CM | POA: Diagnosis not present

## 2015-04-02 DIAGNOSIS — G309 Alzheimer's disease, unspecified: Secondary | ICD-10-CM | POA: Diagnosis not present

## 2015-04-02 DIAGNOSIS — I441 Atrioventricular block, second degree: Secondary | ICD-10-CM | POA: Diagnosis not present

## 2015-04-02 DIAGNOSIS — F0280 Dementia in other diseases classified elsewhere without behavioral disturbance: Secondary | ICD-10-CM | POA: Diagnosis not present

## 2015-04-02 DIAGNOSIS — Z4800 Encounter for change or removal of nonsurgical wound dressing: Secondary | ICD-10-CM | POA: Diagnosis not present

## 2015-04-02 DIAGNOSIS — Z9181 History of falling: Secondary | ICD-10-CM | POA: Diagnosis not present

## 2015-04-02 DIAGNOSIS — N181 Chronic kidney disease, stage 1: Secondary | ICD-10-CM | POA: Diagnosis not present

## 2015-04-02 DIAGNOSIS — F419 Anxiety disorder, unspecified: Secondary | ICD-10-CM | POA: Diagnosis not present

## 2015-04-02 DIAGNOSIS — I129 Hypertensive chronic kidney disease with stage 1 through stage 4 chronic kidney disease, or unspecified chronic kidney disease: Secondary | ICD-10-CM | POA: Diagnosis not present

## 2015-04-02 DIAGNOSIS — J45909 Unspecified asthma, uncomplicated: Secondary | ICD-10-CM | POA: Diagnosis not present

## 2015-04-02 DIAGNOSIS — S81811D Laceration without foreign body, right lower leg, subsequent encounter: Secondary | ICD-10-CM | POA: Diagnosis not present

## 2015-04-02 DIAGNOSIS — M1009 Idiopathic gout, multiple sites: Secondary | ICD-10-CM | POA: Diagnosis not present

## 2015-04-02 DIAGNOSIS — E119 Type 2 diabetes mellitus without complications: Secondary | ICD-10-CM | POA: Diagnosis not present

## 2015-04-04 ENCOUNTER — Ambulatory Visit (INDEPENDENT_AMBULATORY_CARE_PROVIDER_SITE_OTHER): Payer: Medicare Other | Admitting: Internal Medicine

## 2015-04-04 ENCOUNTER — Encounter: Payer: Self-pay | Admitting: Internal Medicine

## 2015-04-04 VITALS — BP 120/78 | HR 45 | Temp 97.4°F | Resp 20 | Ht <= 58 in | Wt 105.4 lb

## 2015-04-04 DIAGNOSIS — L97929 Non-pressure chronic ulcer of unspecified part of left lower leg with unspecified severity: Secondary | ICD-10-CM

## 2015-04-04 DIAGNOSIS — I83019 Varicose veins of right lower extremity with ulcer of unspecified site: Secondary | ICD-10-CM

## 2015-04-04 DIAGNOSIS — K5909 Other constipation: Secondary | ICD-10-CM

## 2015-04-04 DIAGNOSIS — L97919 Non-pressure chronic ulcer of unspecified part of right lower leg with unspecified severity: Secondary | ICD-10-CM

## 2015-04-04 DIAGNOSIS — F329 Major depressive disorder, single episode, unspecified: Secondary | ICD-10-CM | POA: Diagnosis not present

## 2015-04-04 DIAGNOSIS — F32A Depression, unspecified: Secondary | ICD-10-CM

## 2015-04-04 DIAGNOSIS — I872 Venous insufficiency (chronic) (peripheral): Secondary | ICD-10-CM

## 2015-04-04 DIAGNOSIS — J449 Chronic obstructive pulmonary disease, unspecified: Secondary | ICD-10-CM

## 2015-04-04 DIAGNOSIS — L03116 Cellulitis of left lower limb: Secondary | ICD-10-CM

## 2015-04-04 DIAGNOSIS — R627 Adult failure to thrive: Secondary | ICD-10-CM

## 2015-04-04 DIAGNOSIS — G309 Alzheimer's disease, unspecified: Secondary | ICD-10-CM | POA: Diagnosis not present

## 2015-04-04 DIAGNOSIS — T148 Other injury of unspecified body region: Secondary | ICD-10-CM

## 2015-04-04 DIAGNOSIS — F0280 Dementia in other diseases classified elsewhere without behavioral disturbance: Secondary | ICD-10-CM

## 2015-04-04 DIAGNOSIS — I509 Heart failure, unspecified: Secondary | ICD-10-CM

## 2015-04-04 DIAGNOSIS — K5900 Constipation, unspecified: Secondary | ICD-10-CM

## 2015-04-04 DIAGNOSIS — IMO0002 Reserved for concepts with insufficient information to code with codable children: Secondary | ICD-10-CM

## 2015-04-04 DIAGNOSIS — I83029 Varicose veins of left lower extremity with ulcer of unspecified site: Secondary | ICD-10-CM

## 2015-04-04 MED ORDER — SACCHAROMYCES BOULARDII 250 MG PO CAPS: 250.0000 mg | ORAL_CAPSULE | Freq: Two times a day (BID) | ORAL | Status: DC

## 2015-04-04 MED ORDER — DOXYCYCLINE HYCLATE 100 MG PO TABS: 100.0000 mg | ORAL_TABLET | Freq: Two times a day (BID) | ORAL | Status: DC

## 2015-04-04 NOTE — Progress Notes (Signed)
Patient ID: [name], female   DOB: 02/07/1916, 81 y.o.   MRN: [id number]   [facility]  Code Status: DNR Goals of Care: Advanced Directive information Does patient have an advance directive?: Yes, Type of Advance Directive: Healthcare Power of [place];Living will, Does patient want to make changes to advanced directive?: No - Patient declined  Chief Complaint  Patient presents with  . Medical Management of Chronic Issues    3 month follow-up    HPI: Patient is a 81 y.o. white female seen in the office today for med mgt of chronic diseases.  Had a four day slump of not eating associated with depression.  She is doing better now.  Went to her daughter's over the weekend for the 4th.  They went to the movies.  "Me Before You" movie about euthanasia.    Had a fall 03/29/15 and now has three dressings on her legs.  Right wound broke open and left wound lower leg is new.  Nursing sent a note about worsening erythema and pus draining from wounds.    Taking senokot s 2 daily and colace 2 daily.  Sometimes going 2x per day.  She says she is not having loose stools with this.    Is now to get PT for her falls.  --last fall was 03/29/15  Memory has declined especially names.  Had difficulty figuring out which holiday we just celebrated and may not recognize places they've been lately.  COPD is stable.  Not short of breath.No wheezing.  CHF is stable, too.  Has no related concerns.   No chest pain.    Review of Systems:  Review of Systems  Constitutional: Negative for fever and chills.  HENT: Negative for congestion and hearing loss.   Eyes: Positive for blurred vision.  Respiratory: Negative for cough and shortness of breath.   Cardiovascular: Positive for leg swelling. Negative for chest pain.  Gastrointestinal: Positive for constipation. Negative for abdominal pain.       Two stool softeners effective  Genitourinary: Negative for  dysuria.  Musculoskeletal: Positive for back pain.  Skin: Positive for rash. Negative for itching.       Redness of legs with three wounds  Neurological: Positive for weakness. Negative for dizziness and loss of consciousness.  Psychiatric/Behavioral: Positive for depression and memory loss. The patient is not nervous/anxious and does not have insomnia.     Past Medical History  Diagnosis Date  . COPD (chronic obstructive pulmonary disease)   . Hypertension   . Oral aphthae   . Orthostatic hypotension   . Senile osteoporosis   . Atrophy of vulva   . Unspecified urinary incontinence   . Urinary frequency   . Unspecified vitamin D deficiency   . Anxiety state, unspecified   . Restless legs syndrome (RLS)   . Unspecified disorder of kidney and ureter   . Atrioventricular block, unspecified   . Closed dislocation of shoulder, unspecified site   . Anemia, unspecified   . Altered mental status   . Lumbago   . Transient disorder of initiating or maintaining sleep   . Cellulitis and abscess of unspecified site   . Sebaceous cyst   . Insomnia, unspecified   . Dementia in conditions classified elsewhere without behavioral disturbance   . Depressive disorder, not elsewhere classified   . Abnormality of gait   . Adult failure to thrive   . Headache(784.0)   . Gout, unspecified   .  Extrinsic asthma, unspecified   . Chronic kidney disease, stage I   . Osteoarthrosis, unspecified whether generalized or localized, unspecified site   . Spinal stenosis, unspecified region other than cervical   . Edema   . Tachycardia, unspecified     Past Surgical History  Procedure Laterality Date  . Hip surgery      (R) hip replacement  . Cesarean section    . Abdominal hysterectomy      partial  . Back surgery      fusion  . Breast surgery      bilateral breast reduction  . Eye surgery      bilateral cataract  . Basal cell carcinoma excision    . Basal cell carcinoma excision      face    . Hip closed reduction Right 08/07/2013    Procedure: ATTEMPTED CLOSED MANIPULATION HIP;  Surgeon: Jodi Marble, MD;  Location: [facility] OR;  Service: Orthopedics;  Laterality: Right;  . Total hip revision Right 08/08/2013    Procedure: TOTAL HIP REVISION- right;  Surgeon: Velna Ochs, MD;  Location: [facility] OR;  Service: Orthopedics;  Laterality: Right;    Allergies  Allergen Reactions  . Sulfa Antibiotics Shortness Of Breath    Worsens asthma  . Clonidine Derivatives   . Lactose Intolerance (Gi)   . Verapamil     NH MAR   Medications: Patient's Medications  New Prescriptions   DOXYCYCLINE (VIBRA-TABS) 100 MG TABLET    Take 1 tablet (100 mg total) by mouth 2 (two) times daily.   SACCHAROMYCES BOULARDII (FLORASTOR) 250 MG CAPSULE    Take 1 capsule (250 mg total) by mouth 2 (two) times daily.  Previous Medications   ACETAMINOPHEN (TYLENOL) 325 MG TABLET    Take 650 mg by mouth every 6 (six) hours as needed for mild pain. Every 8 hours as needed for pain   CAPSICUM OLEORESIN (TRIXAICIN) 0.025 % CREAM    Apply to right shoulder and upper arm three times daily   CHOLECALCIFEROL (VITAMIN D) 1000 UNITS TABLET    Take 1,000 Units by mouth daily.   CLONAZEPAM (KLONOPIN) 1 MG TABLET    Take one tablet by mouth at bedtime for anxiety   FUROSEMIDE (LASIX) 20 MG TABLET    Take 1 tablet (20 mg total) by mouth every other day.   HYDROCODONE-ACETAMINOPHEN (NORCO/VICODIN) 5-325 MG PER TABLET    Take one to two tablets by mouth twice daily for pain   LISINOPRIL (PRINIVIL,ZESTRIL) 40 MG TABLET    Take 40 mg by mouth daily.   MIRTAZAPINE (REMERON) 7.5 MG TABLET    Take 1 tablet (7.5 mg total) by mouth at bedtime.   POLYETHYL GLYCOL-PROPYL GLYCOL (SYSTANE) 0.4-0.3 % SOLN    Instill one drop into each eye two times daily to relieve dry,itchy eye.   POTASSIUM CHLORIDE SA (K-DUR,KLOR-CON) 20 MEQ TABLET    Take 1 tablet (20 mEq total) by mouth every other day. For chf   SENNA-DOCUSATE (SENOKOT-S) 8.6-50 MG  PER TABLET    Take 1 tablet by mouth daily.  Modified Medications   No medications on file  Discontinued Medications   No medications on file    Physical Exam: Filed Vitals:   04/04/15 1438  BP: 120/78  Pulse: 45  Temp: 97.4 F (36.3 C)  TempSrc: Oral  Resp: 20  Height: 4\' 8"  (1.422 m)  Weight: 105 lb 6.4 oz (47.809 kg)  SpO2: 96%   Physical Exam  Constitutional:  Frail white  female walks with walker with skis  Cardiovascular: Intact distal pulses.   Murmur heard. Pulmonary/Chest: Effort normal and breath sounds normal. She has no wheezes.  Abdominal: Soft. Bowel sounds are normal. She exhibits no distension. There is no tenderness.  Musculoskeletal:  Walks with rolling walker with skis  Neurological: She is alert.  Oriented to person and place not time  Skin:  Three open areas:  Smaller than pencil eraser sized venous ulcer on right lower leg with some thick greenish brown discharge and a touch of surrounding erythema; left lower leg anterior shin wound with some thick greenish brown discharge and significant erythema and swelling of left lateral calf; left patellar region abrasion/laceration present with the dressing adhered to it  Psychiatric: She has a normal mood and affect.  Pleasant and laughing today    Labs reviewed: Basic Metabolic Panel:  Recent Labs  81/19/14 0842 11/04/14 0500 01/08/15 1723  NA 141 140 145  K 4.4 4.2 4.7  CL 107 107 111  CO2 24 25 25   GLUCOSE 89 97 95  BUN 26* 18 30*  CREATININE 1.00 0.97 1.07  CALCIUM 9.2 9.1 9.4  MG  --   --  2.0  PHOS  --   --  4.2  TSH  --   --  1.378   Liver Function Tests:  Recent Labs  11/03/14 0842 11/04/14 0500 01/08/15 1723  AST 22 21 18   ALT 15 12 12   ALKPHOS 74 70 73  BILITOT 0.5 1.0 0.5  PROT 6.6 5.9* 6.3  ALBUMIN 3.8 3.5 3.7   No results for input(s): LIPASE, AMYLASE in the last 8760 hours. No results for input(s): AMMONIA in the last 8760 hours. CBC:  Recent Labs  11/03/14 0842  11/04/14 0500 01/08/15 1723  WBC 8.3 9.0 6.0  NEUTROABS 5.6  --  3.0  HGB 12.1 12.9 11.7*  HCT 36.5 39.2 35.7*  MCV 86.3 86.7 87.1  PLT 222 233 229    Assessment/Plan 1. Cellulitis of leg, left -noted with erythema, swelling of left calf and area surrounding lower wound; also some of right leg around wound -treat with doxycycline for 10 days, florastor for 10 days and yogurt at least daily while taking abx -cont local wound care as per treatment nurse  2. Venous stasis ulcers of both lower extremities -cont right leg wound:  Cleanse with saline, pat dry, apply skin prep to periwound, apply calcium alginate to wound bed and cover with allevyn gentle border dressing 2 times per week; also same treatment to left leg wound ulcer  3. Skin laceration -left knee laceration can be cleansed and treated with antibiotic ointment and covered with dressing -keep clean and dry  4. Depression -doing well with mirtazapine right now; occasionally gets lonely and then does better when her daughter takes her out or she gets more visitors  5. Failure to thrive in adult -weight varies a lot and she goes through spurts of gain and loss -eating well right now  6. Chronic constipation -cont senna and colace with occasional miralax when no bm for 2 days  7. Chronic obstructive pulmonary disease, unspecified COPD, unspecified chronic bronchitis type -not on any treatments for this -is doing very well with breathing  8. Congestive heart failure, unspecified congestive heart failure chronicity, unspecified congestive heart failure type -continues low dose lasix 20mg  every other day due to edema of legs primarily, elevates legs and uses compression hose when wounds are not too bad (when bad uses lighter hose)  9. Alzheimer's disease -is progressing more off aricept, but that was causing more weight loss I thought -trying to treat conservatively overall due to goals of care. -mmse next  time  Labs/tests ordered:  No new today Next appt:  3 mos with mmse  Latarra Eagleton L. Dravon Nott, D.O. Geriatrics [facility] [address], [place] [address] Cell Phone (Mon-Fri 8am-5pm):  [phone] On Call:  [phone] & follow prompts after 5pm & weekends Office Phone:  [phone] Office Fax:  [phone]

## 2015-04-05 DIAGNOSIS — I872 Venous insufficiency (chronic) (peripheral): Secondary | ICD-10-CM | POA: Diagnosis not present

## 2015-04-05 DIAGNOSIS — J45909 Unspecified asthma, uncomplicated: Secondary | ICD-10-CM | POA: Diagnosis not present

## 2015-04-05 DIAGNOSIS — N181 Chronic kidney disease, stage 1: Secondary | ICD-10-CM | POA: Diagnosis not present

## 2015-04-05 DIAGNOSIS — J449 Chronic obstructive pulmonary disease, unspecified: Secondary | ICD-10-CM | POA: Diagnosis not present

## 2015-04-05 DIAGNOSIS — F0280 Dementia in other diseases classified elsewhere without behavioral disturbance: Secondary | ICD-10-CM | POA: Diagnosis not present

## 2015-04-05 DIAGNOSIS — Z9181 History of falling: Secondary | ICD-10-CM | POA: Diagnosis not present

## 2015-04-05 DIAGNOSIS — Z4800 Encounter for change or removal of nonsurgical wound dressing: Secondary | ICD-10-CM | POA: Diagnosis not present

## 2015-04-05 DIAGNOSIS — G309 Alzheimer's disease, unspecified: Secondary | ICD-10-CM | POA: Diagnosis not present

## 2015-04-05 DIAGNOSIS — I129 Hypertensive chronic kidney disease with stage 1 through stage 4 chronic kidney disease, or unspecified chronic kidney disease: Secondary | ICD-10-CM | POA: Diagnosis not present

## 2015-04-05 DIAGNOSIS — E119 Type 2 diabetes mellitus without complications: Secondary | ICD-10-CM | POA: Diagnosis not present

## 2015-04-05 DIAGNOSIS — M1009 Idiopathic gout, multiple sites: Secondary | ICD-10-CM | POA: Diagnosis not present

## 2015-04-05 DIAGNOSIS — S81811D Laceration without foreign body, right lower leg, subsequent encounter: Secondary | ICD-10-CM | POA: Diagnosis not present

## 2015-04-05 DIAGNOSIS — F419 Anxiety disorder, unspecified: Secondary | ICD-10-CM | POA: Diagnosis not present

## 2015-04-05 DIAGNOSIS — I441 Atrioventricular block, second degree: Secondary | ICD-10-CM | POA: Diagnosis not present

## 2015-04-09 DIAGNOSIS — I441 Atrioventricular block, second degree: Secondary | ICD-10-CM | POA: Diagnosis not present

## 2015-04-09 DIAGNOSIS — N181 Chronic kidney disease, stage 1: Secondary | ICD-10-CM | POA: Diagnosis not present

## 2015-04-09 DIAGNOSIS — G309 Alzheimer's disease, unspecified: Secondary | ICD-10-CM | POA: Diagnosis not present

## 2015-04-09 DIAGNOSIS — M1009 Idiopathic gout, multiple sites: Secondary | ICD-10-CM | POA: Diagnosis not present

## 2015-04-09 DIAGNOSIS — J449 Chronic obstructive pulmonary disease, unspecified: Secondary | ICD-10-CM | POA: Diagnosis not present

## 2015-04-09 DIAGNOSIS — Z9181 History of falling: Secondary | ICD-10-CM | POA: Diagnosis not present

## 2015-04-09 DIAGNOSIS — F0280 Dementia in other diseases classified elsewhere without behavioral disturbance: Secondary | ICD-10-CM | POA: Diagnosis not present

## 2015-04-09 DIAGNOSIS — Z4800 Encounter for change or removal of nonsurgical wound dressing: Secondary | ICD-10-CM | POA: Diagnosis not present

## 2015-04-09 DIAGNOSIS — F419 Anxiety disorder, unspecified: Secondary | ICD-10-CM | POA: Diagnosis not present

## 2015-04-09 DIAGNOSIS — E119 Type 2 diabetes mellitus without complications: Secondary | ICD-10-CM | POA: Diagnosis not present

## 2015-04-09 DIAGNOSIS — I129 Hypertensive chronic kidney disease with stage 1 through stage 4 chronic kidney disease, or unspecified chronic kidney disease: Secondary | ICD-10-CM | POA: Diagnosis not present

## 2015-04-09 DIAGNOSIS — I872 Venous insufficiency (chronic) (peripheral): Secondary | ICD-10-CM | POA: Diagnosis not present

## 2015-04-09 DIAGNOSIS — J45909 Unspecified asthma, uncomplicated: Secondary | ICD-10-CM | POA: Diagnosis not present

## 2015-04-09 DIAGNOSIS — S81811D Laceration without foreign body, right lower leg, subsequent encounter: Secondary | ICD-10-CM | POA: Diagnosis not present

## 2015-04-11 DIAGNOSIS — I441 Atrioventricular block, second degree: Secondary | ICD-10-CM | POA: Diagnosis not present

## 2015-04-11 DIAGNOSIS — I129 Hypertensive chronic kidney disease with stage 1 through stage 4 chronic kidney disease, or unspecified chronic kidney disease: Secondary | ICD-10-CM | POA: Diagnosis not present

## 2015-04-11 DIAGNOSIS — I872 Venous insufficiency (chronic) (peripheral): Secondary | ICD-10-CM | POA: Diagnosis not present

## 2015-04-11 DIAGNOSIS — N181 Chronic kidney disease, stage 1: Secondary | ICD-10-CM | POA: Diagnosis not present

## 2015-04-11 DIAGNOSIS — Z9181 History of falling: Secondary | ICD-10-CM | POA: Diagnosis not present

## 2015-04-11 DIAGNOSIS — J45909 Unspecified asthma, uncomplicated: Secondary | ICD-10-CM | POA: Diagnosis not present

## 2015-04-11 DIAGNOSIS — G309 Alzheimer's disease, unspecified: Secondary | ICD-10-CM | POA: Diagnosis not present

## 2015-04-11 DIAGNOSIS — Z4800 Encounter for change or removal of nonsurgical wound dressing: Secondary | ICD-10-CM | POA: Diagnosis not present

## 2015-04-11 DIAGNOSIS — E119 Type 2 diabetes mellitus without complications: Secondary | ICD-10-CM | POA: Diagnosis not present

## 2015-04-11 DIAGNOSIS — F0280 Dementia in other diseases classified elsewhere without behavioral disturbance: Secondary | ICD-10-CM | POA: Diagnosis not present

## 2015-04-11 DIAGNOSIS — J449 Chronic obstructive pulmonary disease, unspecified: Secondary | ICD-10-CM | POA: Diagnosis not present

## 2015-04-11 DIAGNOSIS — M1009 Idiopathic gout, multiple sites: Secondary | ICD-10-CM | POA: Diagnosis not present

## 2015-04-11 DIAGNOSIS — F419 Anxiety disorder, unspecified: Secondary | ICD-10-CM | POA: Diagnosis not present

## 2015-04-11 DIAGNOSIS — S81811D Laceration without foreign body, right lower leg, subsequent encounter: Secondary | ICD-10-CM | POA: Diagnosis not present

## 2015-04-15 DIAGNOSIS — I129 Hypertensive chronic kidney disease with stage 1 through stage 4 chronic kidney disease, or unspecified chronic kidney disease: Secondary | ICD-10-CM | POA: Diagnosis not present

## 2015-04-15 DIAGNOSIS — N181 Chronic kidney disease, stage 1: Secondary | ICD-10-CM | POA: Diagnosis not present

## 2015-04-15 DIAGNOSIS — G309 Alzheimer's disease, unspecified: Secondary | ICD-10-CM | POA: Diagnosis not present

## 2015-04-15 DIAGNOSIS — I872 Venous insufficiency (chronic) (peripheral): Secondary | ICD-10-CM | POA: Diagnosis not present

## 2015-04-15 DIAGNOSIS — E119 Type 2 diabetes mellitus without complications: Secondary | ICD-10-CM | POA: Diagnosis not present

## 2015-04-15 DIAGNOSIS — Z9181 History of falling: Secondary | ICD-10-CM | POA: Diagnosis not present

## 2015-04-15 DIAGNOSIS — M1009 Idiopathic gout, multiple sites: Secondary | ICD-10-CM | POA: Diagnosis not present

## 2015-04-15 DIAGNOSIS — F419 Anxiety disorder, unspecified: Secondary | ICD-10-CM | POA: Diagnosis not present

## 2015-04-15 DIAGNOSIS — S81811D Laceration without foreign body, right lower leg, subsequent encounter: Secondary | ICD-10-CM | POA: Diagnosis not present

## 2015-04-15 DIAGNOSIS — J449 Chronic obstructive pulmonary disease, unspecified: Secondary | ICD-10-CM | POA: Diagnosis not present

## 2015-04-15 DIAGNOSIS — F0280 Dementia in other diseases classified elsewhere without behavioral disturbance: Secondary | ICD-10-CM | POA: Diagnosis not present

## 2015-04-15 DIAGNOSIS — J45909 Unspecified asthma, uncomplicated: Secondary | ICD-10-CM | POA: Diagnosis not present

## 2015-04-15 DIAGNOSIS — Z4800 Encounter for change or removal of nonsurgical wound dressing: Secondary | ICD-10-CM | POA: Diagnosis not present

## 2015-04-15 DIAGNOSIS — I441 Atrioventricular block, second degree: Secondary | ICD-10-CM | POA: Diagnosis not present

## 2015-04-16 ENCOUNTER — Other Ambulatory Visit: Payer: Self-pay | Admitting: *Deleted

## 2015-04-16 DIAGNOSIS — Z9181 History of falling: Secondary | ICD-10-CM | POA: Diagnosis not present

## 2015-04-16 DIAGNOSIS — N181 Chronic kidney disease, stage 1: Secondary | ICD-10-CM | POA: Diagnosis not present

## 2015-04-16 DIAGNOSIS — J449 Chronic obstructive pulmonary disease, unspecified: Secondary | ICD-10-CM | POA: Diagnosis not present

## 2015-04-16 DIAGNOSIS — S81811D Laceration without foreign body, right lower leg, subsequent encounter: Secondary | ICD-10-CM | POA: Diagnosis not present

## 2015-04-16 DIAGNOSIS — T148XXA Other injury of unspecified body region, initial encounter: Secondary | ICD-10-CM

## 2015-04-16 DIAGNOSIS — Z4800 Encounter for change or removal of nonsurgical wound dressing: Secondary | ICD-10-CM | POA: Diagnosis not present

## 2015-04-16 DIAGNOSIS — E119 Type 2 diabetes mellitus without complications: Secondary | ICD-10-CM | POA: Diagnosis not present

## 2015-04-16 DIAGNOSIS — F419 Anxiety disorder, unspecified: Secondary | ICD-10-CM | POA: Diagnosis not present

## 2015-04-16 DIAGNOSIS — I129 Hypertensive chronic kidney disease with stage 1 through stage 4 chronic kidney disease, or unspecified chronic kidney disease: Secondary | ICD-10-CM | POA: Diagnosis not present

## 2015-04-16 DIAGNOSIS — M1009 Idiopathic gout, multiple sites: Secondary | ICD-10-CM | POA: Diagnosis not present

## 2015-04-16 DIAGNOSIS — J45909 Unspecified asthma, uncomplicated: Secondary | ICD-10-CM | POA: Diagnosis not present

## 2015-04-16 DIAGNOSIS — F0280 Dementia in other diseases classified elsewhere without behavioral disturbance: Secondary | ICD-10-CM | POA: Diagnosis not present

## 2015-04-16 DIAGNOSIS — I441 Atrioventricular block, second degree: Secondary | ICD-10-CM | POA: Diagnosis not present

## 2015-04-16 DIAGNOSIS — G309 Alzheimer's disease, unspecified: Secondary | ICD-10-CM | POA: Diagnosis not present

## 2015-04-16 DIAGNOSIS — I872 Venous insufficiency (chronic) (peripheral): Secondary | ICD-10-CM | POA: Diagnosis not present

## 2015-04-16 MED ORDER — HYDROCODONE-ACETAMINOPHEN 5-325 MG PO TABS: ORAL_TABLET | ORAL | Status: DC

## 2015-04-16 NOTE — Telephone Encounter (Signed)
[facility] 

## 2015-04-17 DIAGNOSIS — N181 Chronic kidney disease, stage 1: Secondary | ICD-10-CM | POA: Diagnosis not present

## 2015-04-17 DIAGNOSIS — F419 Anxiety disorder, unspecified: Secondary | ICD-10-CM | POA: Diagnosis not present

## 2015-04-17 DIAGNOSIS — J45909 Unspecified asthma, uncomplicated: Secondary | ICD-10-CM | POA: Diagnosis not present

## 2015-04-17 DIAGNOSIS — M1009 Idiopathic gout, multiple sites: Secondary | ICD-10-CM | POA: Diagnosis not present

## 2015-04-17 DIAGNOSIS — Z4800 Encounter for change or removal of nonsurgical wound dressing: Secondary | ICD-10-CM | POA: Diagnosis not present

## 2015-04-17 DIAGNOSIS — I129 Hypertensive chronic kidney disease with stage 1 through stage 4 chronic kidney disease, or unspecified chronic kidney disease: Secondary | ICD-10-CM | POA: Diagnosis not present

## 2015-04-17 DIAGNOSIS — I872 Venous insufficiency (chronic) (peripheral): Secondary | ICD-10-CM | POA: Diagnosis not present

## 2015-04-17 DIAGNOSIS — J449 Chronic obstructive pulmonary disease, unspecified: Secondary | ICD-10-CM | POA: Diagnosis not present

## 2015-04-17 DIAGNOSIS — G309 Alzheimer's disease, unspecified: Secondary | ICD-10-CM | POA: Diagnosis not present

## 2015-04-17 DIAGNOSIS — Z9181 History of falling: Secondary | ICD-10-CM | POA: Diagnosis not present

## 2015-04-17 DIAGNOSIS — S81811D Laceration without foreign body, right lower leg, subsequent encounter: Secondary | ICD-10-CM | POA: Diagnosis not present

## 2015-04-17 DIAGNOSIS — I441 Atrioventricular block, second degree: Secondary | ICD-10-CM | POA: Diagnosis not present

## 2015-04-17 DIAGNOSIS — E119 Type 2 diabetes mellitus without complications: Secondary | ICD-10-CM | POA: Diagnosis not present

## 2015-04-17 DIAGNOSIS — F0280 Dementia in other diseases classified elsewhere without behavioral disturbance: Secondary | ICD-10-CM | POA: Diagnosis not present

## 2015-04-18 DIAGNOSIS — I441 Atrioventricular block, second degree: Secondary | ICD-10-CM | POA: Diagnosis not present

## 2015-04-18 DIAGNOSIS — I872 Venous insufficiency (chronic) (peripheral): Secondary | ICD-10-CM | POA: Diagnosis not present

## 2015-04-18 DIAGNOSIS — S81811D Laceration without foreign body, right lower leg, subsequent encounter: Secondary | ICD-10-CM | POA: Diagnosis not present

## 2015-04-18 DIAGNOSIS — E119 Type 2 diabetes mellitus without complications: Secondary | ICD-10-CM | POA: Diagnosis not present

## 2015-04-18 DIAGNOSIS — J45909 Unspecified asthma, uncomplicated: Secondary | ICD-10-CM | POA: Diagnosis not present

## 2015-04-18 DIAGNOSIS — I129 Hypertensive chronic kidney disease with stage 1 through stage 4 chronic kidney disease, or unspecified chronic kidney disease: Secondary | ICD-10-CM | POA: Diagnosis not present

## 2015-04-18 DIAGNOSIS — M1009 Idiopathic gout, multiple sites: Secondary | ICD-10-CM | POA: Diagnosis not present

## 2015-04-18 DIAGNOSIS — Z9181 History of falling: Secondary | ICD-10-CM | POA: Diagnosis not present

## 2015-04-18 DIAGNOSIS — F0280 Dementia in other diseases classified elsewhere without behavioral disturbance: Secondary | ICD-10-CM | POA: Diagnosis not present

## 2015-04-18 DIAGNOSIS — Z4800 Encounter for change or removal of nonsurgical wound dressing: Secondary | ICD-10-CM | POA: Diagnosis not present

## 2015-04-18 DIAGNOSIS — J449 Chronic obstructive pulmonary disease, unspecified: Secondary | ICD-10-CM | POA: Diagnosis not present

## 2015-04-18 DIAGNOSIS — N181 Chronic kidney disease, stage 1: Secondary | ICD-10-CM | POA: Diagnosis not present

## 2015-04-18 DIAGNOSIS — G309 Alzheimer's disease, unspecified: Secondary | ICD-10-CM | POA: Diagnosis not present

## 2015-04-18 DIAGNOSIS — F419 Anxiety disorder, unspecified: Secondary | ICD-10-CM | POA: Diagnosis not present

## 2015-04-19 DIAGNOSIS — Z4800 Encounter for change or removal of nonsurgical wound dressing: Secondary | ICD-10-CM | POA: Diagnosis not present

## 2015-04-19 DIAGNOSIS — S81811D Laceration without foreign body, right lower leg, subsequent encounter: Secondary | ICD-10-CM | POA: Diagnosis not present

## 2015-04-19 DIAGNOSIS — I441 Atrioventricular block, second degree: Secondary | ICD-10-CM | POA: Diagnosis not present

## 2015-04-19 DIAGNOSIS — G309 Alzheimer's disease, unspecified: Secondary | ICD-10-CM | POA: Diagnosis not present

## 2015-04-19 DIAGNOSIS — J45909 Unspecified asthma, uncomplicated: Secondary | ICD-10-CM | POA: Diagnosis not present

## 2015-04-19 DIAGNOSIS — I872 Venous insufficiency (chronic) (peripheral): Secondary | ICD-10-CM | POA: Diagnosis not present

## 2015-04-19 DIAGNOSIS — M1009 Idiopathic gout, multiple sites: Secondary | ICD-10-CM | POA: Diagnosis not present

## 2015-04-19 DIAGNOSIS — N181 Chronic kidney disease, stage 1: Secondary | ICD-10-CM | POA: Diagnosis not present

## 2015-04-19 DIAGNOSIS — I129 Hypertensive chronic kidney disease with stage 1 through stage 4 chronic kidney disease, or unspecified chronic kidney disease: Secondary | ICD-10-CM | POA: Diagnosis not present

## 2015-04-19 DIAGNOSIS — Z9181 History of falling: Secondary | ICD-10-CM | POA: Diagnosis not present

## 2015-04-19 DIAGNOSIS — E119 Type 2 diabetes mellitus without complications: Secondary | ICD-10-CM | POA: Diagnosis not present

## 2015-04-19 DIAGNOSIS — F419 Anxiety disorder, unspecified: Secondary | ICD-10-CM | POA: Diagnosis not present

## 2015-04-19 DIAGNOSIS — F0280 Dementia in other diseases classified elsewhere without behavioral disturbance: Secondary | ICD-10-CM | POA: Diagnosis not present

## 2015-04-19 DIAGNOSIS — J449 Chronic obstructive pulmonary disease, unspecified: Secondary | ICD-10-CM | POA: Diagnosis not present

## 2015-04-22 DIAGNOSIS — S81811D Laceration without foreign body, right lower leg, subsequent encounter: Secondary | ICD-10-CM | POA: Diagnosis not present

## 2015-04-22 DIAGNOSIS — Z9181 History of falling: Secondary | ICD-10-CM | POA: Diagnosis not present

## 2015-04-22 DIAGNOSIS — F0280 Dementia in other diseases classified elsewhere without behavioral disturbance: Secondary | ICD-10-CM | POA: Diagnosis not present

## 2015-04-22 DIAGNOSIS — E119 Type 2 diabetes mellitus without complications: Secondary | ICD-10-CM | POA: Diagnosis not present

## 2015-04-22 DIAGNOSIS — J449 Chronic obstructive pulmonary disease, unspecified: Secondary | ICD-10-CM | POA: Diagnosis not present

## 2015-04-22 DIAGNOSIS — M1009 Idiopathic gout, multiple sites: Secondary | ICD-10-CM | POA: Diagnosis not present

## 2015-04-22 DIAGNOSIS — I441 Atrioventricular block, second degree: Secondary | ICD-10-CM | POA: Diagnosis not present

## 2015-04-22 DIAGNOSIS — I872 Venous insufficiency (chronic) (peripheral): Secondary | ICD-10-CM | POA: Diagnosis not present

## 2015-04-22 DIAGNOSIS — I129 Hypertensive chronic kidney disease with stage 1 through stage 4 chronic kidney disease, or unspecified chronic kidney disease: Secondary | ICD-10-CM | POA: Diagnosis not present

## 2015-04-22 DIAGNOSIS — F419 Anxiety disorder, unspecified: Secondary | ICD-10-CM | POA: Diagnosis not present

## 2015-04-22 DIAGNOSIS — N181 Chronic kidney disease, stage 1: Secondary | ICD-10-CM | POA: Diagnosis not present

## 2015-04-22 DIAGNOSIS — J45909 Unspecified asthma, uncomplicated: Secondary | ICD-10-CM | POA: Diagnosis not present

## 2015-04-22 DIAGNOSIS — G309 Alzheimer's disease, unspecified: Secondary | ICD-10-CM | POA: Diagnosis not present

## 2015-04-22 DIAGNOSIS — Z4800 Encounter for change or removal of nonsurgical wound dressing: Secondary | ICD-10-CM | POA: Diagnosis not present

## 2015-04-23 ENCOUNTER — Telehealth: Payer: Self-pay

## 2015-04-23 ENCOUNTER — Other Ambulatory Visit: Payer: Self-pay | Admitting: Internal Medicine

## 2015-04-23 DIAGNOSIS — N181 Chronic kidney disease, stage 1: Secondary | ICD-10-CM | POA: Diagnosis not present

## 2015-04-23 DIAGNOSIS — G309 Alzheimer's disease, unspecified: Secondary | ICD-10-CM | POA: Diagnosis not present

## 2015-04-23 DIAGNOSIS — I129 Hypertensive chronic kidney disease with stage 1 through stage 4 chronic kidney disease, or unspecified chronic kidney disease: Secondary | ICD-10-CM | POA: Diagnosis not present

## 2015-04-23 DIAGNOSIS — J45909 Unspecified asthma, uncomplicated: Secondary | ICD-10-CM | POA: Diagnosis not present

## 2015-04-23 DIAGNOSIS — Z4800 Encounter for change or removal of nonsurgical wound dressing: Secondary | ICD-10-CM | POA: Diagnosis not present

## 2015-04-23 DIAGNOSIS — S81811D Laceration without foreign body, right lower leg, subsequent encounter: Secondary | ICD-10-CM | POA: Diagnosis not present

## 2015-04-23 DIAGNOSIS — F0280 Dementia in other diseases classified elsewhere without behavioral disturbance: Secondary | ICD-10-CM | POA: Diagnosis not present

## 2015-04-23 DIAGNOSIS — Z9181 History of falling: Secondary | ICD-10-CM | POA: Diagnosis not present

## 2015-04-23 DIAGNOSIS — F419 Anxiety disorder, unspecified: Secondary | ICD-10-CM | POA: Diagnosis not present

## 2015-04-23 DIAGNOSIS — I441 Atrioventricular block, second degree: Secondary | ICD-10-CM | POA: Diagnosis not present

## 2015-04-23 DIAGNOSIS — J449 Chronic obstructive pulmonary disease, unspecified: Secondary | ICD-10-CM | POA: Diagnosis not present

## 2015-04-23 DIAGNOSIS — M1009 Idiopathic gout, multiple sites: Secondary | ICD-10-CM | POA: Diagnosis not present

## 2015-04-23 DIAGNOSIS — E119 Type 2 diabetes mellitus without complications: Secondary | ICD-10-CM | POA: Diagnosis not present

## 2015-04-23 DIAGNOSIS — I872 Venous insufficiency (chronic) (peripheral): Secondary | ICD-10-CM | POA: Diagnosis not present

## 2015-04-23 NOTE — Telephone Encounter (Signed)
Patients  Physical therapist called to ask if you would give the patient something for her right leg she says that it is swollen and red patient took a fall 2 days ago but she thinks it might be cellulitis. Please advise

## 2015-04-24 DIAGNOSIS — F0280 Dementia in other diseases classified elsewhere without behavioral disturbance: Secondary | ICD-10-CM | POA: Diagnosis not present

## 2015-04-24 DIAGNOSIS — I441 Atrioventricular block, second degree: Secondary | ICD-10-CM | POA: Diagnosis not present

## 2015-04-24 DIAGNOSIS — I872 Venous insufficiency (chronic) (peripheral): Secondary | ICD-10-CM | POA: Diagnosis not present

## 2015-04-24 DIAGNOSIS — Z4800 Encounter for change or removal of nonsurgical wound dressing: Secondary | ICD-10-CM | POA: Diagnosis not present

## 2015-04-24 DIAGNOSIS — N181 Chronic kidney disease, stage 1: Secondary | ICD-10-CM | POA: Diagnosis not present

## 2015-04-24 DIAGNOSIS — I129 Hypertensive chronic kidney disease with stage 1 through stage 4 chronic kidney disease, or unspecified chronic kidney disease: Secondary | ICD-10-CM | POA: Diagnosis not present

## 2015-04-24 DIAGNOSIS — J449 Chronic obstructive pulmonary disease, unspecified: Secondary | ICD-10-CM | POA: Diagnosis not present

## 2015-04-24 DIAGNOSIS — E119 Type 2 diabetes mellitus without complications: Secondary | ICD-10-CM | POA: Diagnosis not present

## 2015-04-24 DIAGNOSIS — G309 Alzheimer's disease, unspecified: Secondary | ICD-10-CM | POA: Diagnosis not present

## 2015-04-24 DIAGNOSIS — Z9181 History of falling: Secondary | ICD-10-CM | POA: Diagnosis not present

## 2015-04-24 DIAGNOSIS — M1009 Idiopathic gout, multiple sites: Secondary | ICD-10-CM | POA: Diagnosis not present

## 2015-04-24 DIAGNOSIS — J45909 Unspecified asthma, uncomplicated: Secondary | ICD-10-CM | POA: Diagnosis not present

## 2015-04-24 DIAGNOSIS — F419 Anxiety disorder, unspecified: Secondary | ICD-10-CM | POA: Diagnosis not present

## 2015-04-24 DIAGNOSIS — S81811D Laceration without foreign body, right lower leg, subsequent encounter: Secondary | ICD-10-CM | POA: Diagnosis not present

## 2015-04-24 NOTE — Telephone Encounter (Signed)
Isn't a nurse monitoring her wounds on her legs?  Was that service discontinued?  Please call the therapist back and find out.  If they don't know, contact her daughter.

## 2015-04-24 NOTE — Telephone Encounter (Signed)
Ok, let's send an order to her facility and a printed out prescription for doxycycline  po bid for 14 days.  She should take yogurt daily while on it.

## 2015-04-24 NOTE — Telephone Encounter (Signed)
Spoke with daughter of patient she said she didn't think her mother needed to be seen, but that she has seen small amounts of cellulitis in her mother's legs  come and go and that if you wanted to extend her antibiotic that would be fine .

## 2015-04-24 NOTE — Telephone Encounter (Signed)
I hate to make her come in again for this, but I don't want to keep giving antibiotics.  She could get c diff from them.  Can you notify her daughter and see what she thinks is going on?  Her daughter is a Scientist, clinical (histocompatibility and immunogenetics) and has been keeping an eye on what's going on with her mom.

## 2015-04-24 NOTE — Telephone Encounter (Signed)
Called and spoke with the therapist she said yes the nurse is still working with patient as far as wound care for her leg but it is her left leg that has the wound it is the right one she was talking about she says it has a type of ball on anterior and posterior of the calf of the right leg. Please advise

## 2015-04-25 ENCOUNTER — Other Ambulatory Visit: Payer: Self-pay

## 2015-04-25 ENCOUNTER — Other Ambulatory Visit: Payer: Self-pay | Admitting: *Deleted

## 2015-04-25 DIAGNOSIS — G309 Alzheimer's disease, unspecified: Secondary | ICD-10-CM | POA: Diagnosis not present

## 2015-04-25 DIAGNOSIS — I129 Hypertensive chronic kidney disease with stage 1 through stage 4 chronic kidney disease, or unspecified chronic kidney disease: Secondary | ICD-10-CM | POA: Diagnosis not present

## 2015-04-25 DIAGNOSIS — F0280 Dementia in other diseases classified elsewhere without behavioral disturbance: Secondary | ICD-10-CM | POA: Diagnosis not present

## 2015-04-25 DIAGNOSIS — S81811D Laceration without foreign body, right lower leg, subsequent encounter: Secondary | ICD-10-CM | POA: Diagnosis not present

## 2015-04-25 DIAGNOSIS — E119 Type 2 diabetes mellitus without complications: Secondary | ICD-10-CM | POA: Diagnosis not present

## 2015-04-25 DIAGNOSIS — I872 Venous insufficiency (chronic) (peripheral): Secondary | ICD-10-CM | POA: Diagnosis not present

## 2015-04-25 DIAGNOSIS — J45909 Unspecified asthma, uncomplicated: Secondary | ICD-10-CM | POA: Diagnosis not present

## 2015-04-25 DIAGNOSIS — M1009 Idiopathic gout, multiple sites: Secondary | ICD-10-CM | POA: Diagnosis not present

## 2015-04-25 DIAGNOSIS — J449 Chronic obstructive pulmonary disease, unspecified: Secondary | ICD-10-CM | POA: Diagnosis not present

## 2015-04-25 DIAGNOSIS — F419 Anxiety disorder, unspecified: Secondary | ICD-10-CM | POA: Diagnosis not present

## 2015-04-25 DIAGNOSIS — Z4800 Encounter for change or removal of nonsurgical wound dressing: Secondary | ICD-10-CM | POA: Diagnosis not present

## 2015-04-25 DIAGNOSIS — N181 Chronic kidney disease, stage 1: Secondary | ICD-10-CM | POA: Diagnosis not present

## 2015-04-25 DIAGNOSIS — Z9181 History of falling: Secondary | ICD-10-CM | POA: Diagnosis not present

## 2015-04-25 DIAGNOSIS — I441 Atrioventricular block, second degree: Secondary | ICD-10-CM | POA: Diagnosis not present

## 2015-04-25 MED ORDER — DOXYCYCLINE HYCLATE 100 MG PO TABS
100.0000 mg | ORAL_TABLET | Freq: Two times a day (BID) | ORAL | Status: DC
Start: 2015-04-25 — End: 2015-05-23

## 2015-04-25 MED ORDER — CLONAZEPAM 1 MG PO TABS: ORAL_TABLET | ORAL | Status: DC

## 2015-04-25 NOTE — Telephone Encounter (Signed)
[facility] 

## 2015-04-25 NOTE — Telephone Encounter (Signed)
Called patients daughter [facility] for her to call office, sent her mothers prescription to pharmacy at [facility].

## 2015-04-26 DIAGNOSIS — J45909 Unspecified asthma, uncomplicated: Secondary | ICD-10-CM | POA: Diagnosis not present

## 2015-04-26 DIAGNOSIS — M1009 Idiopathic gout, multiple sites: Secondary | ICD-10-CM | POA: Diagnosis not present

## 2015-04-26 DIAGNOSIS — Z4800 Encounter for change or removal of nonsurgical wound dressing: Secondary | ICD-10-CM | POA: Diagnosis not present

## 2015-04-26 DIAGNOSIS — I129 Hypertensive chronic kidney disease with stage 1 through stage 4 chronic kidney disease, or unspecified chronic kidney disease: Secondary | ICD-10-CM | POA: Diagnosis not present

## 2015-04-26 DIAGNOSIS — F0280 Dementia in other diseases classified elsewhere without behavioral disturbance: Secondary | ICD-10-CM | POA: Diagnosis not present

## 2015-04-26 DIAGNOSIS — S81811D Laceration without foreign body, right lower leg, subsequent encounter: Secondary | ICD-10-CM | POA: Diagnosis not present

## 2015-04-26 DIAGNOSIS — F419 Anxiety disorder, unspecified: Secondary | ICD-10-CM | POA: Diagnosis not present

## 2015-04-26 DIAGNOSIS — E119 Type 2 diabetes mellitus without complications: Secondary | ICD-10-CM | POA: Diagnosis not present

## 2015-04-26 DIAGNOSIS — N181 Chronic kidney disease, stage 1: Secondary | ICD-10-CM | POA: Diagnosis not present

## 2015-04-26 DIAGNOSIS — G309 Alzheimer's disease, unspecified: Secondary | ICD-10-CM | POA: Diagnosis not present

## 2015-04-26 DIAGNOSIS — Z9181 History of falling: Secondary | ICD-10-CM | POA: Diagnosis not present

## 2015-04-26 DIAGNOSIS — J449 Chronic obstructive pulmonary disease, unspecified: Secondary | ICD-10-CM | POA: Diagnosis not present

## 2015-04-26 DIAGNOSIS — I872 Venous insufficiency (chronic) (peripheral): Secondary | ICD-10-CM | POA: Diagnosis not present

## 2015-04-26 DIAGNOSIS — I441 Atrioventricular block, second degree: Secondary | ICD-10-CM | POA: Diagnosis not present

## 2015-04-29 DIAGNOSIS — Z9181 History of falling: Secondary | ICD-10-CM | POA: Diagnosis not present

## 2015-04-29 DIAGNOSIS — S81011A Laceration without foreign body, right knee, initial encounter: Secondary | ICD-10-CM | POA: Diagnosis not present

## 2015-04-29 DIAGNOSIS — G309 Alzheimer's disease, unspecified: Secondary | ICD-10-CM | POA: Diagnosis not present

## 2015-04-29 DIAGNOSIS — E119 Type 2 diabetes mellitus without complications: Secondary | ICD-10-CM | POA: Diagnosis not present

## 2015-04-29 DIAGNOSIS — N181 Chronic kidney disease, stage 1: Secondary | ICD-10-CM | POA: Diagnosis not present

## 2015-04-29 DIAGNOSIS — F0280 Dementia in other diseases classified elsewhere without behavioral disturbance: Secondary | ICD-10-CM | POA: Diagnosis not present

## 2015-04-29 DIAGNOSIS — R2681 Unsteadiness on feet: Secondary | ICD-10-CM | POA: Diagnosis not present

## 2015-04-29 DIAGNOSIS — J45909 Unspecified asthma, uncomplicated: Secondary | ICD-10-CM | POA: Diagnosis not present

## 2015-04-29 DIAGNOSIS — S51011A Laceration without foreign body of right elbow, initial encounter: Secondary | ICD-10-CM | POA: Diagnosis not present

## 2015-04-29 DIAGNOSIS — M4800 Spinal stenosis, site unspecified: Secondary | ICD-10-CM | POA: Diagnosis not present

## 2015-04-29 DIAGNOSIS — L03115 Cellulitis of right lower limb: Secondary | ICD-10-CM | POA: Diagnosis not present

## 2015-04-29 DIAGNOSIS — J449 Chronic obstructive pulmonary disease, unspecified: Secondary | ICD-10-CM | POA: Diagnosis not present

## 2015-04-29 DIAGNOSIS — Z4800 Encounter for change or removal of nonsurgical wound dressing: Secondary | ICD-10-CM | POA: Diagnosis not present

## 2015-04-29 DIAGNOSIS — I129 Hypertensive chronic kidney disease with stage 1 through stage 4 chronic kidney disease, or unspecified chronic kidney disease: Secondary | ICD-10-CM | POA: Diagnosis not present

## 2015-04-30 DIAGNOSIS — E119 Type 2 diabetes mellitus without complications: Secondary | ICD-10-CM | POA: Diagnosis not present

## 2015-04-30 DIAGNOSIS — Z4800 Encounter for change or removal of nonsurgical wound dressing: Secondary | ICD-10-CM | POA: Diagnosis not present

## 2015-04-30 DIAGNOSIS — J449 Chronic obstructive pulmonary disease, unspecified: Secondary | ICD-10-CM | POA: Diagnosis not present

## 2015-04-30 DIAGNOSIS — J45909 Unspecified asthma, uncomplicated: Secondary | ICD-10-CM | POA: Diagnosis not present

## 2015-04-30 DIAGNOSIS — I129 Hypertensive chronic kidney disease with stage 1 through stage 4 chronic kidney disease, or unspecified chronic kidney disease: Secondary | ICD-10-CM | POA: Diagnosis not present

## 2015-04-30 DIAGNOSIS — L03115 Cellulitis of right lower limb: Secondary | ICD-10-CM | POA: Diagnosis not present

## 2015-04-30 DIAGNOSIS — G309 Alzheimer's disease, unspecified: Secondary | ICD-10-CM | POA: Diagnosis not present

## 2015-04-30 DIAGNOSIS — Z9181 History of falling: Secondary | ICD-10-CM | POA: Diagnosis not present

## 2015-04-30 DIAGNOSIS — N181 Chronic kidney disease, stage 1: Secondary | ICD-10-CM | POA: Diagnosis not present

## 2015-04-30 DIAGNOSIS — S81011A Laceration without foreign body, right knee, initial encounter: Secondary | ICD-10-CM | POA: Diagnosis not present

## 2015-04-30 DIAGNOSIS — S51011A Laceration without foreign body of right elbow, initial encounter: Secondary | ICD-10-CM | POA: Diagnosis not present

## 2015-04-30 DIAGNOSIS — F0280 Dementia in other diseases classified elsewhere without behavioral disturbance: Secondary | ICD-10-CM | POA: Diagnosis not present

## 2015-04-30 DIAGNOSIS — M4800 Spinal stenosis, site unspecified: Secondary | ICD-10-CM | POA: Diagnosis not present

## 2015-04-30 DIAGNOSIS — R2681 Unsteadiness on feet: Secondary | ICD-10-CM | POA: Diagnosis not present

## 2015-05-01 DIAGNOSIS — M4800 Spinal stenosis, site unspecified: Secondary | ICD-10-CM | POA: Diagnosis not present

## 2015-05-01 DIAGNOSIS — J449 Chronic obstructive pulmonary disease, unspecified: Secondary | ICD-10-CM | POA: Diagnosis not present

## 2015-05-01 DIAGNOSIS — Z9181 History of falling: Secondary | ICD-10-CM | POA: Diagnosis not present

## 2015-05-01 DIAGNOSIS — F0280 Dementia in other diseases classified elsewhere without behavioral disturbance: Secondary | ICD-10-CM | POA: Diagnosis not present

## 2015-05-01 DIAGNOSIS — E119 Type 2 diabetes mellitus without complications: Secondary | ICD-10-CM | POA: Diagnosis not present

## 2015-05-01 DIAGNOSIS — G309 Alzheimer's disease, unspecified: Secondary | ICD-10-CM | POA: Diagnosis not present

## 2015-05-01 DIAGNOSIS — I129 Hypertensive chronic kidney disease with stage 1 through stage 4 chronic kidney disease, or unspecified chronic kidney disease: Secondary | ICD-10-CM | POA: Diagnosis not present

## 2015-05-01 DIAGNOSIS — Z4800 Encounter for change or removal of nonsurgical wound dressing: Secondary | ICD-10-CM | POA: Diagnosis not present

## 2015-05-01 DIAGNOSIS — S81011A Laceration without foreign body, right knee, initial encounter: Secondary | ICD-10-CM | POA: Diagnosis not present

## 2015-05-01 DIAGNOSIS — R2681 Unsteadiness on feet: Secondary | ICD-10-CM | POA: Diagnosis not present

## 2015-05-01 DIAGNOSIS — L03115 Cellulitis of right lower limb: Secondary | ICD-10-CM | POA: Diagnosis not present

## 2015-05-01 DIAGNOSIS — J45909 Unspecified asthma, uncomplicated: Secondary | ICD-10-CM | POA: Diagnosis not present

## 2015-05-01 DIAGNOSIS — S51011A Laceration without foreign body of right elbow, initial encounter: Secondary | ICD-10-CM | POA: Diagnosis not present

## 2015-05-01 DIAGNOSIS — N181 Chronic kidney disease, stage 1: Secondary | ICD-10-CM | POA: Diagnosis not present

## 2015-05-02 DIAGNOSIS — S51011A Laceration without foreign body of right elbow, initial encounter: Secondary | ICD-10-CM | POA: Diagnosis not present

## 2015-05-02 DIAGNOSIS — Z9181 History of falling: Secondary | ICD-10-CM | POA: Diagnosis not present

## 2015-05-02 DIAGNOSIS — G309 Alzheimer's disease, unspecified: Secondary | ICD-10-CM | POA: Diagnosis not present

## 2015-05-02 DIAGNOSIS — E119 Type 2 diabetes mellitus without complications: Secondary | ICD-10-CM | POA: Diagnosis not present

## 2015-05-02 DIAGNOSIS — L03115 Cellulitis of right lower limb: Secondary | ICD-10-CM | POA: Diagnosis not present

## 2015-05-02 DIAGNOSIS — R2681 Unsteadiness on feet: Secondary | ICD-10-CM | POA: Diagnosis not present

## 2015-05-02 DIAGNOSIS — J449 Chronic obstructive pulmonary disease, unspecified: Secondary | ICD-10-CM | POA: Diagnosis not present

## 2015-05-02 DIAGNOSIS — F0280 Dementia in other diseases classified elsewhere without behavioral disturbance: Secondary | ICD-10-CM | POA: Diagnosis not present

## 2015-05-02 DIAGNOSIS — J45909 Unspecified asthma, uncomplicated: Secondary | ICD-10-CM | POA: Diagnosis not present

## 2015-05-02 DIAGNOSIS — N181 Chronic kidney disease, stage 1: Secondary | ICD-10-CM | POA: Diagnosis not present

## 2015-05-02 DIAGNOSIS — Z4800 Encounter for change or removal of nonsurgical wound dressing: Secondary | ICD-10-CM | POA: Diagnosis not present

## 2015-05-02 DIAGNOSIS — I129 Hypertensive chronic kidney disease with stage 1 through stage 4 chronic kidney disease, or unspecified chronic kidney disease: Secondary | ICD-10-CM | POA: Diagnosis not present

## 2015-05-02 DIAGNOSIS — M4800 Spinal stenosis, site unspecified: Secondary | ICD-10-CM | POA: Diagnosis not present

## 2015-05-02 DIAGNOSIS — S81011A Laceration without foreign body, right knee, initial encounter: Secondary | ICD-10-CM | POA: Diagnosis not present

## 2015-05-03 DIAGNOSIS — F0280 Dementia in other diseases classified elsewhere without behavioral disturbance: Secondary | ICD-10-CM | POA: Diagnosis not present

## 2015-05-03 DIAGNOSIS — Z9181 History of falling: Secondary | ICD-10-CM | POA: Diagnosis not present

## 2015-05-03 DIAGNOSIS — S81011A Laceration without foreign body, right knee, initial encounter: Secondary | ICD-10-CM | POA: Diagnosis not present

## 2015-05-03 DIAGNOSIS — I129 Hypertensive chronic kidney disease with stage 1 through stage 4 chronic kidney disease, or unspecified chronic kidney disease: Secondary | ICD-10-CM | POA: Diagnosis not present

## 2015-05-03 DIAGNOSIS — Z4800 Encounter for change or removal of nonsurgical wound dressing: Secondary | ICD-10-CM | POA: Diagnosis not present

## 2015-05-03 DIAGNOSIS — S51011A Laceration without foreign body of right elbow, initial encounter: Secondary | ICD-10-CM | POA: Diagnosis not present

## 2015-05-03 DIAGNOSIS — N181 Chronic kidney disease, stage 1: Secondary | ICD-10-CM | POA: Diagnosis not present

## 2015-05-03 DIAGNOSIS — J45909 Unspecified asthma, uncomplicated: Secondary | ICD-10-CM | POA: Diagnosis not present

## 2015-05-03 DIAGNOSIS — E119 Type 2 diabetes mellitus without complications: Secondary | ICD-10-CM | POA: Diagnosis not present

## 2015-05-03 DIAGNOSIS — J449 Chronic obstructive pulmonary disease, unspecified: Secondary | ICD-10-CM | POA: Diagnosis not present

## 2015-05-03 DIAGNOSIS — G309 Alzheimer's disease, unspecified: Secondary | ICD-10-CM | POA: Diagnosis not present

## 2015-05-03 DIAGNOSIS — R2681 Unsteadiness on feet: Secondary | ICD-10-CM | POA: Diagnosis not present

## 2015-05-03 DIAGNOSIS — L03115 Cellulitis of right lower limb: Secondary | ICD-10-CM | POA: Diagnosis not present

## 2015-05-03 DIAGNOSIS — M4800 Spinal stenosis, site unspecified: Secondary | ICD-10-CM | POA: Diagnosis not present

## 2015-05-07 DIAGNOSIS — M4800 Spinal stenosis, site unspecified: Secondary | ICD-10-CM | POA: Diagnosis not present

## 2015-05-07 DIAGNOSIS — J45909 Unspecified asthma, uncomplicated: Secondary | ICD-10-CM | POA: Diagnosis not present

## 2015-05-07 DIAGNOSIS — R2681 Unsteadiness on feet: Secondary | ICD-10-CM | POA: Diagnosis not present

## 2015-05-07 DIAGNOSIS — E119 Type 2 diabetes mellitus without complications: Secondary | ICD-10-CM | POA: Diagnosis not present

## 2015-05-07 DIAGNOSIS — Z4800 Encounter for change or removal of nonsurgical wound dressing: Secondary | ICD-10-CM | POA: Diagnosis not present

## 2015-05-07 DIAGNOSIS — F0280 Dementia in other diseases classified elsewhere without behavioral disturbance: Secondary | ICD-10-CM | POA: Diagnosis not present

## 2015-05-07 DIAGNOSIS — N181 Chronic kidney disease, stage 1: Secondary | ICD-10-CM | POA: Diagnosis not present

## 2015-05-07 DIAGNOSIS — Z9181 History of falling: Secondary | ICD-10-CM | POA: Diagnosis not present

## 2015-05-07 DIAGNOSIS — S81011A Laceration without foreign body, right knee, initial encounter: Secondary | ICD-10-CM | POA: Diagnosis not present

## 2015-05-07 DIAGNOSIS — S51011A Laceration without foreign body of right elbow, initial encounter: Secondary | ICD-10-CM | POA: Diagnosis not present

## 2015-05-07 DIAGNOSIS — J449 Chronic obstructive pulmonary disease, unspecified: Secondary | ICD-10-CM | POA: Diagnosis not present

## 2015-05-07 DIAGNOSIS — I129 Hypertensive chronic kidney disease with stage 1 through stage 4 chronic kidney disease, or unspecified chronic kidney disease: Secondary | ICD-10-CM | POA: Diagnosis not present

## 2015-05-07 DIAGNOSIS — G309 Alzheimer's disease, unspecified: Secondary | ICD-10-CM | POA: Diagnosis not present

## 2015-05-07 DIAGNOSIS — L03115 Cellulitis of right lower limb: Secondary | ICD-10-CM | POA: Diagnosis not present

## 2015-05-09 DIAGNOSIS — L03115 Cellulitis of right lower limb: Secondary | ICD-10-CM | POA: Diagnosis not present

## 2015-05-09 DIAGNOSIS — Z4800 Encounter for change or removal of nonsurgical wound dressing: Secondary | ICD-10-CM | POA: Diagnosis not present

## 2015-05-09 DIAGNOSIS — N181 Chronic kidney disease, stage 1: Secondary | ICD-10-CM | POA: Diagnosis not present

## 2015-05-09 DIAGNOSIS — R2681 Unsteadiness on feet: Secondary | ICD-10-CM | POA: Diagnosis not present

## 2015-05-09 DIAGNOSIS — S81011A Laceration without foreign body, right knee, initial encounter: Secondary | ICD-10-CM | POA: Diagnosis not present

## 2015-05-09 DIAGNOSIS — J45909 Unspecified asthma, uncomplicated: Secondary | ICD-10-CM | POA: Diagnosis not present

## 2015-05-09 DIAGNOSIS — I129 Hypertensive chronic kidney disease with stage 1 through stage 4 chronic kidney disease, or unspecified chronic kidney disease: Secondary | ICD-10-CM | POA: Diagnosis not present

## 2015-05-09 DIAGNOSIS — S51011A Laceration without foreign body of right elbow, initial encounter: Secondary | ICD-10-CM | POA: Diagnosis not present

## 2015-05-09 DIAGNOSIS — E119 Type 2 diabetes mellitus without complications: Secondary | ICD-10-CM | POA: Diagnosis not present

## 2015-05-09 DIAGNOSIS — Z9181 History of falling: Secondary | ICD-10-CM | POA: Diagnosis not present

## 2015-05-09 DIAGNOSIS — G309 Alzheimer's disease, unspecified: Secondary | ICD-10-CM | POA: Diagnosis not present

## 2015-05-09 DIAGNOSIS — F0280 Dementia in other diseases classified elsewhere without behavioral disturbance: Secondary | ICD-10-CM | POA: Diagnosis not present

## 2015-05-09 DIAGNOSIS — M4800 Spinal stenosis, site unspecified: Secondary | ICD-10-CM | POA: Diagnosis not present

## 2015-05-09 DIAGNOSIS — J449 Chronic obstructive pulmonary disease, unspecified: Secondary | ICD-10-CM | POA: Diagnosis not present

## 2015-05-10 DIAGNOSIS — Z4800 Encounter for change or removal of nonsurgical wound dressing: Secondary | ICD-10-CM | POA: Diagnosis not present

## 2015-05-10 DIAGNOSIS — N181 Chronic kidney disease, stage 1: Secondary | ICD-10-CM | POA: Diagnosis not present

## 2015-05-10 DIAGNOSIS — M4800 Spinal stenosis, site unspecified: Secondary | ICD-10-CM | POA: Diagnosis not present

## 2015-05-10 DIAGNOSIS — Z9181 History of falling: Secondary | ICD-10-CM | POA: Diagnosis not present

## 2015-05-10 DIAGNOSIS — R2681 Unsteadiness on feet: Secondary | ICD-10-CM | POA: Diagnosis not present

## 2015-05-10 DIAGNOSIS — I129 Hypertensive chronic kidney disease with stage 1 through stage 4 chronic kidney disease, or unspecified chronic kidney disease: Secondary | ICD-10-CM | POA: Diagnosis not present

## 2015-05-10 DIAGNOSIS — E119 Type 2 diabetes mellitus without complications: Secondary | ICD-10-CM | POA: Diagnosis not present

## 2015-05-10 DIAGNOSIS — S81011A Laceration without foreign body, right knee, initial encounter: Secondary | ICD-10-CM | POA: Diagnosis not present

## 2015-05-10 DIAGNOSIS — L03115 Cellulitis of right lower limb: Secondary | ICD-10-CM | POA: Diagnosis not present

## 2015-05-10 DIAGNOSIS — F0280 Dementia in other diseases classified elsewhere without behavioral disturbance: Secondary | ICD-10-CM | POA: Diagnosis not present

## 2015-05-10 DIAGNOSIS — S51011A Laceration without foreign body of right elbow, initial encounter: Secondary | ICD-10-CM | POA: Diagnosis not present

## 2015-05-10 DIAGNOSIS — J449 Chronic obstructive pulmonary disease, unspecified: Secondary | ICD-10-CM | POA: Diagnosis not present

## 2015-05-10 DIAGNOSIS — G309 Alzheimer's disease, unspecified: Secondary | ICD-10-CM | POA: Diagnosis not present

## 2015-05-10 DIAGNOSIS — J45909 Unspecified asthma, uncomplicated: Secondary | ICD-10-CM | POA: Diagnosis not present

## 2015-05-14 DIAGNOSIS — R2681 Unsteadiness on feet: Secondary | ICD-10-CM | POA: Diagnosis not present

## 2015-05-14 DIAGNOSIS — J449 Chronic obstructive pulmonary disease, unspecified: Secondary | ICD-10-CM | POA: Diagnosis not present

## 2015-05-14 DIAGNOSIS — N181 Chronic kidney disease, stage 1: Secondary | ICD-10-CM | POA: Diagnosis not present

## 2015-05-14 DIAGNOSIS — S81011A Laceration without foreign body, right knee, initial encounter: Secondary | ICD-10-CM | POA: Diagnosis not present

## 2015-05-14 DIAGNOSIS — Z4800 Encounter for change or removal of nonsurgical wound dressing: Secondary | ICD-10-CM | POA: Diagnosis not present

## 2015-05-14 DIAGNOSIS — J45909 Unspecified asthma, uncomplicated: Secondary | ICD-10-CM | POA: Diagnosis not present

## 2015-05-14 DIAGNOSIS — Z9181 History of falling: Secondary | ICD-10-CM | POA: Diagnosis not present

## 2015-05-14 DIAGNOSIS — E119 Type 2 diabetes mellitus without complications: Secondary | ICD-10-CM | POA: Diagnosis not present

## 2015-05-14 DIAGNOSIS — F0280 Dementia in other diseases classified elsewhere without behavioral disturbance: Secondary | ICD-10-CM | POA: Diagnosis not present

## 2015-05-14 DIAGNOSIS — L03115 Cellulitis of right lower limb: Secondary | ICD-10-CM | POA: Diagnosis not present

## 2015-05-14 DIAGNOSIS — M4800 Spinal stenosis, site unspecified: Secondary | ICD-10-CM | POA: Diagnosis not present

## 2015-05-14 DIAGNOSIS — G309 Alzheimer's disease, unspecified: Secondary | ICD-10-CM | POA: Diagnosis not present

## 2015-05-14 DIAGNOSIS — I129 Hypertensive chronic kidney disease with stage 1 through stage 4 chronic kidney disease, or unspecified chronic kidney disease: Secondary | ICD-10-CM | POA: Diagnosis not present

## 2015-05-14 DIAGNOSIS — S51011A Laceration without foreign body of right elbow, initial encounter: Secondary | ICD-10-CM | POA: Diagnosis not present

## 2015-05-15 ENCOUNTER — Emergency Department (HOSPITAL_COMMUNITY): Payer: Medicare Other

## 2015-05-15 ENCOUNTER — Emergency Department (HOSPITAL_COMMUNITY)
Admission: EM | Admit: 2015-05-15 | Discharge: 2015-05-15 | Disposition: A | Discharge status: Skilled Nursing Facility | Payer: Medicare Other | Attending: Emergency Medicine | Admitting: Emergency Medicine

## 2015-05-15 ENCOUNTER — Encounter (HOSPITAL_COMMUNITY): Payer: Self-pay | Admitting: *Deleted

## 2015-05-15 DIAGNOSIS — M25561 Pain in right knee: Secondary | ICD-10-CM | POA: Diagnosis not present

## 2015-05-15 DIAGNOSIS — S51011A Laceration without foreign body of right elbow, initial encounter: Secondary | ICD-10-CM | POA: Diagnosis not present

## 2015-05-15 DIAGNOSIS — Z79899 Other long term (current) drug therapy: Secondary | ICD-10-CM | POA: Insufficient documentation

## 2015-05-15 DIAGNOSIS — E559 Vitamin D deficiency, unspecified: Secondary | ICD-10-CM | POA: Insufficient documentation

## 2015-05-15 DIAGNOSIS — Z87448 Personal history of other diseases of urinary system: Secondary | ICD-10-CM | POA: Diagnosis not present

## 2015-05-15 DIAGNOSIS — S51001A Unspecified open wound of right elbow, initial encounter: Secondary | ICD-10-CM | POA: Diagnosis not present

## 2015-05-15 DIAGNOSIS — I499 Cardiac arrhythmia, unspecified: Secondary | ICD-10-CM | POA: Diagnosis not present

## 2015-05-15 DIAGNOSIS — R001 Bradycardia, unspecified: Secondary | ICD-10-CM | POA: Diagnosis not present

## 2015-05-15 DIAGNOSIS — Y92009 Unspecified place in unspecified non-institutional (private) residence as the place of occurrence of the external cause: Secondary | ICD-10-CM | POA: Diagnosis not present

## 2015-05-15 DIAGNOSIS — S81011A Laceration without foreign body, right knee, initial encounter: Secondary | ICD-10-CM | POA: Diagnosis not present

## 2015-05-15 DIAGNOSIS — M810 Age-related osteoporosis without current pathological fracture: Secondary | ICD-10-CM | POA: Insufficient documentation

## 2015-05-15 DIAGNOSIS — I129 Hypertensive chronic kidney disease with stage 1 through stage 4 chronic kidney disease, or unspecified chronic kidney disease: Secondary | ICD-10-CM | POA: Insufficient documentation

## 2015-05-15 DIAGNOSIS — F419 Anxiety disorder, unspecified: Secondary | ICD-10-CM | POA: Insufficient documentation

## 2015-05-15 DIAGNOSIS — Z862 Personal history of diseases of the blood and blood-forming organs and certain disorders involving the immune mechanism: Secondary | ICD-10-CM | POA: Diagnosis not present

## 2015-05-15 DIAGNOSIS — F0390 Unspecified dementia without behavioral disturbance: Secondary | ICD-10-CM | POA: Insufficient documentation

## 2015-05-15 DIAGNOSIS — J449 Chronic obstructive pulmonary disease, unspecified: Secondary | ICD-10-CM | POA: Insufficient documentation

## 2015-05-15 DIAGNOSIS — N181 Chronic kidney disease, stage 1: Secondary | ICD-10-CM | POA: Insufficient documentation

## 2015-05-15 DIAGNOSIS — Y998 Other external cause status: Secondary | ICD-10-CM | POA: Insufficient documentation

## 2015-05-15 DIAGNOSIS — S8991XA Unspecified injury of right lower leg, initial encounter: Secondary | ICD-10-CM | POA: Diagnosis not present

## 2015-05-15 DIAGNOSIS — W19XXXA Unspecified fall, initial encounter: Secondary | ICD-10-CM

## 2015-05-15 DIAGNOSIS — Y9389 Activity, other specified: Secondary | ICD-10-CM | POA: Diagnosis not present

## 2015-05-15 DIAGNOSIS — S41109A Unspecified open wound of unspecified upper arm, initial encounter: Secondary | ICD-10-CM | POA: Diagnosis not present

## 2015-05-15 DIAGNOSIS — W1839XA Other fall on same level, initial encounter: Secondary | ICD-10-CM | POA: Insufficient documentation

## 2015-05-15 DIAGNOSIS — Z872 Personal history of diseases of the skin and subcutaneous tissue: Secondary | ICD-10-CM | POA: Insufficient documentation

## 2015-05-15 MED ORDER — LIDOCAINE HCL (PF) 1 % IJ SOLN
5.0000 mL | Freq: Once | INTRAMUSCULAR | Status: AC
  Administered 2015-05-15: 5 mL
  Filled 2015-05-15: qty 5

## 2015-05-15 MED ORDER — ACETAMINOPHEN 500 MG PO TABS
1000.0000 mg | ORAL_TABLET | Freq: Once | ORAL | Status: AC
  Administered 2015-05-15: 1000 mg via ORAL
  Filled 2015-05-15: qty 2

## 2015-05-15 NOTE — ED Notes (Signed)
Bed: WA20 Expected date: 05/15/15 Expected time: 11:14 AM Means of arrival: Ambulance Comments: EMS elderly abd pain

## 2015-05-15 NOTE — ED Provider Notes (Signed)
CSN: [id number]     Arrival date & time 05/15/15  1111 History   First MD Initiated Contact with Patient 05/15/15 1134     Chief Complaint  Patient presents with  . Fall    pain right knee and arm     (Consider location/radiation/quality/duration/timing/severity/associated sxs/prior Treatment) HPI Patient describes a mechanical fall at her home at [facility]. She reports her foot got caught on a carpet and she fell onto her right side injuring her right elbow and her right knee. She reports it has been painful to put weight on her right knee but she has been able to do it slightly. She did sustain a cut to both her elbow and her knee. She denies that she struck her head or has a headache. She denies any cervical, chest, abdominal pain. Denies weakness numbness or tinging them. Past Medical History  Diagnosis Date  . COPD (chronic obstructive pulmonary disease)   . Hypertension   . Oral aphthae   . Orthostatic hypotension   . Senile osteoporosis   . Atrophy of vulva   . Unspecified urinary incontinence   . Urinary frequency   . Unspecified vitamin D deficiency   . Anxiety state, unspecified   . Restless legs syndrome (RLS)   . Unspecified disorder of kidney and ureter   . Atrioventricular block, unspecified   . Closed dislocation of shoulder, unspecified site   . Anemia, unspecified   . Altered mental status   . Lumbago   . Transient disorder of initiating or maintaining sleep   . Cellulitis and abscess of unspecified site   . Sebaceous cyst   . Insomnia, unspecified   . Dementia in conditions classified elsewhere without behavioral disturbance   . Depressive disorder, not elsewhere classified   . Abnormality of gait   . Adult failure to thrive   . Headache(784.0)   . Gout, unspecified   . Extrinsic asthma, unspecified   . Chronic kidney disease, stage I   . Osteoarthrosis, unspecified whether generalized or localized, unspecified site   . Spinal stenosis,  unspecified region other than cervical   . Edema   . Tachycardia, unspecified    Past Surgical History  Procedure Laterality Date  . Hip surgery      (R) hip replacement  . Cesarean section    . Abdominal hysterectomy      partial  . Back surgery      fusion  . Breast surgery      bilateral breast reduction  . Eye surgery      bilateral cataract  . Basal cell carcinoma excision    . Basal cell carcinoma excision      face  . Hip closed reduction Right 08/07/2013    Procedure: ATTEMPTED CLOSED MANIPULATION HIP;  Surgeon: Jodi Marble, MD;  Location: [facility] OR;  Service: Orthopedics;  Laterality: Right;  . Total hip revision Right 08/08/2013    Procedure: TOTAL HIP REVISION- right;  Surgeon: Velna Ochs, MD;  Location: [facility] OR;  Service: Orthopedics;  Laterality: Right;   Family History  Problem Relation Age of Onset  . Diabetes Mother   . Diabetes Father   . Stroke Sister   . Heart attack Brother    Social History  Substance Use Topics  . Smoking status: Never Smoker   . Smokeless tobacco: Never Used  . Alcohol Use: No   OB History    No data available     Review of Systems 10 Systems  reviewed and are negative for acute change except as noted in the HPI.    Allergies  Sulfa antibiotics; Clonidine derivatives; Lactose intolerance (gi); and Verapamil  Home Medications   Prior to Admission medications   Medication Sig Start Date End Date Taking? Authorizing Provider  acetaminophen (TYLENOL) 325 MG tablet Take 650 mg by mouth every 6 (six) hours as needed for mild pain.    Yes Historical Provider, MD  capsicum oleoresin (TRIXAICIN) 0.025 % cream Apply to right shoulder and upper arm three times daily Patient taking differently: Apply 1 application topically 3 (three) times daily. Apply to right shoulder/upper arm 07/30/14  Yes Tiffany L Reed, DO  cholecalciferol (VITAMIN D) 1000 UNITS tablet Take 1,000 Units by mouth daily.   Yes Historical Provider, MD   clonazePAM (KLONOPIN) 1 MG tablet Take one tablet by mouth at bedtime for anxiety Patient taking differently: Take 1 mg by mouth at bedtime.  04/25/15  Yes Sharon Seller, NP  docusate sodium (COLACE) 100 MG capsule Take 100 mg by mouth 2 (two) times daily.   Yes Historical Provider, MD  furosemide (LASIX) 20 MG tablet Take 1 tablet (20 mg total) by mouth every other day. 03/05/15  Yes Sharon Seller, NP  HYDROcodone-acetaminophen (NORCO/VICODIN) 5-325 MG per tablet Take one to two tablets by mouth twice daily for pain Patient taking differently: Take 2 tablets by mouth 2 (two) times daily.  04/16/15  Yes Kimber Relic, MD  lisinopril (PRINIVIL,ZESTRIL) 40 MG tablet Take 40 mg by mouth daily.   Yes Historical Provider, MD  mirtazapine (REMERON) 7.5 MG tablet Take 1 tablet (7.5 mg total) by mouth at bedtime. 12/17/14  Yes Sharon Seller, NP  Polyethyl Glycol-Propyl Glycol (SYSTANE) 0.4-0.3 % SOLN Instill one drop into each eye two times daily to relieve dry,itchy eye. Patient taking differently: Place 1 % into both eyes 2 (two) times daily. to relieve dry,itchy eye. 02/01/14  Yes Sharon Seller, NP  potassium chloride SA (K-DUR,KLOR-CON) 20 MEQ tablet Take 1 tablet (20 mEq total) by mouth every other day. For chf 12/17/14  Yes Sharon Seller, NP  senna-docusate (SENOKOT-S) 8.6-50 MG per tablet Take 1 tablet by mouth daily.   Yes Historical Provider, MD  doxycycline (VIBRA-TABS) 100 MG tablet Take 1 tablet (100 mg total) by mouth 2 (two) times daily. Patient not taking: Reported on 05/15/2015 04/25/15   Tiffany L Reed, DO  saccharomyces boulardii (FLORASTOR) 250 MG capsule Take 1 capsule (250 mg total) by mouth 2 (two) times daily. Patient not taking: Reported on 05/15/2015 04/04/15   Tiffany L Reed, DO   BP 231/71 mmHg  Pulse 67  Temp(Src) 97.6 F (36.4 C) (Oral)  Resp 15  SpO2 97% Physical Exam  Constitutional: She is oriented to person, place, and time. She appears well-developed and  well-nourished.  HENT:  Head: Normocephalic and atraumatic.  Right Ear: External ear normal.  Left Ear: External ear normal.  Nose: Nose normal.  Mouth/Throat: Oropharynx is clear and moist.  Eyes: EOM are normal. Pupils are equal, round, and reactive to light.  Neck: Neck supple.  Cardiovascular: Normal rate, regular rhythm, normal heart sounds and intact distal pulses.   Pulmonary/Chest: Effort normal and breath sounds normal.  Abdominal: Soft. Bowel sounds are normal. She exhibits no distension. There is no tenderness.  Musculoskeletal: Normal range of motion. She exhibits no edema.  Patient has a skin tear to the right elbow just below the olecranon. This is a flap of approximately  1-1/2 cm. Re-aligns nicely. There is some bruising in this region as well but there is no effusion or deformity of the elbow. Patient has intact range of motion using the elbow without difficulty. Patient performs full range of motion of both upper extremity is. Right knee has a complex laceration. There is skin tear that goes to full depth at the cephalad portion. The entire diameter of the wound is approximately 4 cm. There is about 1 cm of wound to depth at the cephalad margin, there is approximately 3 cm of very superficial skin tear in the caudad direction. The knee itself has no effusion. And she does maintain range of motion.  Neurological: She is alert and oriented to person, place, and time. She has normal strength. No cranial nerve deficit. She exhibits normal muscle tone. Coordination normal. GCS eye subscore is 4. GCS verbal subscore is 5. GCS motor subscore is 6.  Skin: Skin is warm, dry and intact.  Psychiatric: She has a normal mood and affect.    ED Course  Procedures (including critical care time) LACERATION REPAIR Performed by: Arby Barrette Authorized by: Arby Barrette Consent: Verbal consent obtained. Risks and benefits: risks, benefits and alternatives were discussed Consent given  by: patient Patient identity confirmed: provided demographic data Prepped and Draped in normal sterile fashion Wound explored  Laceration Location: right knee  Laceration Length: 4 cm  No Foreign Bodies seen or palpated  Anesthesia: local infiltration  Local anesthetic: lidocaine 1%  Anesthetic total: 4 ml  Irrigation method: syringe Amount of cleaning: standard  Skin closure:   Number of sutures: 3  Technique: interupted. The wound was cleaned and prepped. Due to irregular depth with a very superficial skin tear to full-thickness\avulsion, partial closure was done. The cephalad portion of wound was closed with 3 interrupted sutures. The skin was then arranged and covered the remainder of the wound. Xeroform was placed over this with subsequent gauze dressings.  Patient tolerance: Patient tolerated the procedure well with no immediate complications. Labs Review Labs Reviewed - No data to display  Imaging Review Dg Knee Complete 4 Views Right  05/15/2015   CLINICAL DATA:  Status post slip and fall today. Right knee pain. Initial encounter.  EXAM: RIGHT KNEE - COMPLETE 4+ VIEW  COMPARISON:  Plain films of right lower leg 06/05/2009.  FINDINGS: No acute bony or joint abnormality is identified. There is no joint effusion. Chondrocalcinosis is noted. Joint spaces appear preserved. Atherosclerotic vascular disease is identified.  IMPRESSION: No acute abnormality.  Chondrocalcinosis.   Electronically Signed   By: Drusilla Kanner M.D.   On: 05/15/2015 13:51   I have personally reviewed and evaluated these images and lab results as part of my medical decision-making.   EKG Interpretation None      MDM   Final diagnoses:  Fall, initial encounter  Knee laceration, right, initial encounter  Skin tear of elbow without complication, right, initial encounter   Skin tears were cleaned and dressed with Xeroform and then overlaying bulky dressing. X ray does not show any fracture to  the knee. No other significant injury was identified to head, neck or thorax. Wound care instructions are provided.    Arby Barrette, MD 05/15/15 [phone]

## 2015-05-15 NOTE — ED Notes (Signed)
tegaderm applied to right elbow 

## 2015-05-15 NOTE — Discharge Instructions (Signed)
Skin Tear Care  You have an antibiotic, nonstick dressing over your skin tears. This product is called Xeroform. You may change the overlying bandages and rewrap daily if they are soiled. Leave the Xeroform gauze in place until rechecked by your doctor within 2-4 days. Try to keep your elbow and knee as straight as possible with any kind of activities. Bending disrupts the skin below the dressing as there is much movement at the elbow and the knee.   A skin tear is a wound in which the top layer of skin has peeled off. This is a common problem with aging because the skin becomes thinner and more fragile as a person gets older. In addition, some medicines, such as oral corticosteroids, can lead to skin thinning if taken for long periods of time.  A skin tear is often repaired with tape or skin adhesive strips. This keeps the skin that has been peeled off in contact with the healthier skin beneath. Depending on the location of the wound, a bandage (dressing) may be applied over the tape or skin adhesive strips. Sometimes, during the healing process, the skin turns black and dies. Even when this happens, the torn skin acts as a good dressing until the skin underneath gets healthier and repairs itself. HOME CARE INSTRUCTIONS   Change dressings once per day or as directed by your caregiver.  Gently clean the skin tear and the area around the tear using saline solution or mild soap and water.  Do not rub the injured skin dry. Let the area air dry.  Apply petroleum jelly or an antibiotic cream or ointment to keep the tear moist. This will help the wound heal. Do not allow a scab to form.  If the dressing sticks before the next dressing change, moisten it with warm soapy water and gently remove it.  Protect the injured skin until it has healed.  Only take over-the-counter or prescription medicines as directed by your caregiver.  Take showers or baths using warm soapy water. Apply a new dressing after  the shower or bath.  Keep all follow-up appointments as directed by your caregiver.  SEEK IMMEDIATE MEDICAL CARE IF:   You have redness, swelling, or increasing pain in the skin tear.  You havepus coming from the skin tear.  You have chills.  You have a red streak that goes away from the skin tear.  You have a bad smell coming from the tear or dressing.  You have a fever or persistent symptoms for more than 2-3 days.  You have a fever and your symptoms suddenly get worse. MAKE SURE YOU:  Understand these instructions.  Will watch this condition.  Will get help right away if your child is not doing well or gets worse. Document Released: 06/09/2001 Document Revised: 06/08/2012 Document Reviewed: 03/28/2012 [facility] Patient Information 2015 [place], [place]. This information is not intended to replace advice given to you by your health care provider. Make sure you discuss any questions you have with your health care provider.

## 2015-05-15 NOTE — ED Notes (Signed)
[phone] [name] contacted for report

## 2015-05-15 NOTE — ED Notes (Signed)
Questions concerns r/t dc were denied by pt. She is a&ox4

## 2015-05-15 NOTE — ED Notes (Signed)
Pt brought into the ED today via EMS d/t fall at [facility]. She denied LOC, pt reports foot getting caught on carpet causing her to fall onto right side. Pt has a Laceration to right arm and knee. VS stable, last 138/82,56,16

## 2015-05-16 DIAGNOSIS — S51011A Laceration without foreign body of right elbow, initial encounter: Secondary | ICD-10-CM | POA: Diagnosis not present

## 2015-05-16 DIAGNOSIS — I129 Hypertensive chronic kidney disease with stage 1 through stage 4 chronic kidney disease, or unspecified chronic kidney disease: Secondary | ICD-10-CM | POA: Diagnosis not present

## 2015-05-16 DIAGNOSIS — L03115 Cellulitis of right lower limb: Secondary | ICD-10-CM | POA: Diagnosis not present

## 2015-05-16 DIAGNOSIS — E119 Type 2 diabetes mellitus without complications: Secondary | ICD-10-CM | POA: Diagnosis not present

## 2015-05-16 DIAGNOSIS — G309 Alzheimer's disease, unspecified: Secondary | ICD-10-CM | POA: Diagnosis not present

## 2015-05-16 DIAGNOSIS — R2681 Unsteadiness on feet: Secondary | ICD-10-CM | POA: Diagnosis not present

## 2015-05-16 DIAGNOSIS — S81011A Laceration without foreign body, right knee, initial encounter: Secondary | ICD-10-CM | POA: Diagnosis not present

## 2015-05-16 DIAGNOSIS — M4800 Spinal stenosis, site unspecified: Secondary | ICD-10-CM | POA: Diagnosis not present

## 2015-05-16 DIAGNOSIS — J45909 Unspecified asthma, uncomplicated: Secondary | ICD-10-CM | POA: Diagnosis not present

## 2015-05-16 DIAGNOSIS — Z9181 History of falling: Secondary | ICD-10-CM | POA: Diagnosis not present

## 2015-05-16 DIAGNOSIS — F0280 Dementia in other diseases classified elsewhere without behavioral disturbance: Secondary | ICD-10-CM | POA: Diagnosis not present

## 2015-05-16 DIAGNOSIS — N181 Chronic kidney disease, stage 1: Secondary | ICD-10-CM | POA: Diagnosis not present

## 2015-05-16 DIAGNOSIS — J449 Chronic obstructive pulmonary disease, unspecified: Secondary | ICD-10-CM | POA: Diagnosis not present

## 2015-05-16 DIAGNOSIS — Z4800 Encounter for change or removal of nonsurgical wound dressing: Secondary | ICD-10-CM | POA: Diagnosis not present

## 2015-05-17 DIAGNOSIS — E119 Type 2 diabetes mellitus without complications: Secondary | ICD-10-CM | POA: Diagnosis not present

## 2015-05-17 DIAGNOSIS — Z9181 History of falling: Secondary | ICD-10-CM | POA: Diagnosis not present

## 2015-05-17 DIAGNOSIS — N181 Chronic kidney disease, stage 1: Secondary | ICD-10-CM | POA: Diagnosis not present

## 2015-05-17 DIAGNOSIS — Z4800 Encounter for change or removal of nonsurgical wound dressing: Secondary | ICD-10-CM | POA: Diagnosis not present

## 2015-05-17 DIAGNOSIS — G309 Alzheimer's disease, unspecified: Secondary | ICD-10-CM | POA: Diagnosis not present

## 2015-05-17 DIAGNOSIS — S51011A Laceration without foreign body of right elbow, initial encounter: Secondary | ICD-10-CM | POA: Diagnosis not present

## 2015-05-17 DIAGNOSIS — J449 Chronic obstructive pulmonary disease, unspecified: Secondary | ICD-10-CM | POA: Diagnosis not present

## 2015-05-17 DIAGNOSIS — I129 Hypertensive chronic kidney disease with stage 1 through stage 4 chronic kidney disease, or unspecified chronic kidney disease: Secondary | ICD-10-CM | POA: Diagnosis not present

## 2015-05-17 DIAGNOSIS — M4800 Spinal stenosis, site unspecified: Secondary | ICD-10-CM | POA: Diagnosis not present

## 2015-05-17 DIAGNOSIS — F0280 Dementia in other diseases classified elsewhere without behavioral disturbance: Secondary | ICD-10-CM | POA: Diagnosis not present

## 2015-05-17 DIAGNOSIS — S81011A Laceration without foreign body, right knee, initial encounter: Secondary | ICD-10-CM | POA: Diagnosis not present

## 2015-05-17 DIAGNOSIS — J45909 Unspecified asthma, uncomplicated: Secondary | ICD-10-CM | POA: Diagnosis not present

## 2015-05-17 DIAGNOSIS — R2681 Unsteadiness on feet: Secondary | ICD-10-CM | POA: Diagnosis not present

## 2015-05-17 DIAGNOSIS — L03115 Cellulitis of right lower limb: Secondary | ICD-10-CM | POA: Diagnosis not present

## 2015-05-21 DIAGNOSIS — Z9181 History of falling: Secondary | ICD-10-CM | POA: Diagnosis not present

## 2015-05-21 DIAGNOSIS — L03115 Cellulitis of right lower limb: Secondary | ICD-10-CM | POA: Diagnosis not present

## 2015-05-21 DIAGNOSIS — M4800 Spinal stenosis, site unspecified: Secondary | ICD-10-CM | POA: Diagnosis not present

## 2015-05-21 DIAGNOSIS — Z4800 Encounter for change or removal of nonsurgical wound dressing: Secondary | ICD-10-CM | POA: Diagnosis not present

## 2015-05-21 DIAGNOSIS — R2681 Unsteadiness on feet: Secondary | ICD-10-CM | POA: Diagnosis not present

## 2015-05-21 DIAGNOSIS — J449 Chronic obstructive pulmonary disease, unspecified: Secondary | ICD-10-CM | POA: Diagnosis not present

## 2015-05-21 DIAGNOSIS — I129 Hypertensive chronic kidney disease with stage 1 through stage 4 chronic kidney disease, or unspecified chronic kidney disease: Secondary | ICD-10-CM | POA: Diagnosis not present

## 2015-05-21 DIAGNOSIS — E119 Type 2 diabetes mellitus without complications: Secondary | ICD-10-CM | POA: Diagnosis not present

## 2015-05-21 DIAGNOSIS — N181 Chronic kidney disease, stage 1: Secondary | ICD-10-CM | POA: Diagnosis not present

## 2015-05-21 DIAGNOSIS — J45909 Unspecified asthma, uncomplicated: Secondary | ICD-10-CM | POA: Diagnosis not present

## 2015-05-21 DIAGNOSIS — S81011A Laceration without foreign body, right knee, initial encounter: Secondary | ICD-10-CM | POA: Diagnosis not present

## 2015-05-21 DIAGNOSIS — F0280 Dementia in other diseases classified elsewhere without behavioral disturbance: Secondary | ICD-10-CM | POA: Diagnosis not present

## 2015-05-21 DIAGNOSIS — S51011A Laceration without foreign body of right elbow, initial encounter: Secondary | ICD-10-CM | POA: Diagnosis not present

## 2015-05-21 DIAGNOSIS — G309 Alzheimer's disease, unspecified: Secondary | ICD-10-CM | POA: Diagnosis not present

## 2015-05-22 DIAGNOSIS — S81011A Laceration without foreign body, right knee, initial encounter: Secondary | ICD-10-CM | POA: Diagnosis not present

## 2015-05-22 DIAGNOSIS — M4800 Spinal stenosis, site unspecified: Secondary | ICD-10-CM | POA: Diagnosis not present

## 2015-05-22 DIAGNOSIS — L03115 Cellulitis of right lower limb: Secondary | ICD-10-CM | POA: Diagnosis not present

## 2015-05-22 DIAGNOSIS — S51011A Laceration without foreign body of right elbow, initial encounter: Secondary | ICD-10-CM | POA: Diagnosis not present

## 2015-05-23 ENCOUNTER — Ambulatory Visit (INDEPENDENT_AMBULATORY_CARE_PROVIDER_SITE_OTHER): Payer: Medicare Other | Admitting: Nurse Practitioner

## 2015-05-23 ENCOUNTER — Encounter: Payer: Self-pay | Admitting: Nurse Practitioner

## 2015-05-23 VITALS — BP 132/70 | HR 43 | Temp 97.8°F | Resp 14 | Ht <= 58 in | Wt 103.0 lb

## 2015-05-23 DIAGNOSIS — Z23 Encounter for immunization: Secondary | ICD-10-CM

## 2015-05-23 DIAGNOSIS — L03115 Cellulitis of right lower limb: Secondary | ICD-10-CM | POA: Diagnosis not present

## 2015-05-23 DIAGNOSIS — T148 Other injury of unspecified body region: Secondary | ICD-10-CM | POA: Diagnosis not present

## 2015-05-23 DIAGNOSIS — T148XXA Other injury of unspecified body region, initial encounter: Secondary | ICD-10-CM

## 2015-05-23 MED ORDER — ZOSTER VACCINE LIVE 19400 UNT/0.65ML ~~LOC~~ SOLR: 0.6500 mL | Freq: Once | SUBCUTANEOUS | Status: DC

## 2015-05-23 MED ORDER — DOXYCYCLINE HYCLATE 100 MG PO TABS
100.0000 mg | ORAL_TABLET | Freq: Two times a day (BID) | ORAL | Status: DC
Start: 2015-05-23 — End: 2015-07-30

## 2015-05-23 MED ORDER — SACCHAROMYCES BOULARDII 250 MG PO CAPS: 250.0000 mg | ORAL_CAPSULE | Freq: Two times a day (BID) | ORAL | Status: DC

## 2015-05-23 MED ORDER — TETANUS-DIPHTH-ACELL PERTUSSIS 5-2.5-18.5 LF-MCG/0.5 IM SUSP: 0.5000 mL | Freq: Once | INTRAMUSCULAR | Status: DC

## 2015-05-23 NOTE — Patient Instructions (Addendum)
Stop keflex  Start doxycyline 100 mg by mouth twice daily for 10 days Take florastor by mouth twice daily for 14 days  Notify office if pain becomes worse  Cellulitis Cellulitis is an infection of the skin and the tissue beneath it. The infected area is usually red and tender. Cellulitis occurs most often in the arms and lower legs.  CAUSES  Cellulitis is caused by bacteria that enter the skin through cracks or cuts in the skin. The most common types of bacteria that cause cellulitis are staphylococci and streptococci. SIGNS AND SYMPTOMS   Redness and warmth.  Swelling.  Tenderness or pain.  Fever. DIAGNOSIS  Your health care provider can usually determine what is wrong based on a physical exam. Blood tests may also be done. TREATMENT  Treatment usually involves taking an antibiotic medicine. HOME CARE INSTRUCTIONS   Take your antibiotic medicine as directed by your health care provider. Finish the antibiotic even if you start to feel better.  Keep the infected arm or leg elevated to reduce swelling.  Apply a warm cloth to the affected area up to 4 times per day to relieve pain.  Take medicines only as directed by your health care provider.  Keep all follow-up visits as directed by your health care provider. SEEK MEDICAL CARE IF:   You notice red streaks coming from the infected area.  Your red area gets larger or turns dark in color.  Your bone or joint underneath the infected area becomes painful after the skin has healed.  Your infection returns in the same area or another area.  You notice a swollen bump in the infected area.  You develop new symptoms.  You have a fever. SEEK IMMEDIATE MEDICAL CARE IF:   You feel very sleepy.  You develop vomiting or diarrhea.  You have a general ill feeling (malaise) with muscle aches and pains. MAKE SURE YOU:   Understand these instructions.  Will watch your condition.  Will get help right away if you are not doing  well or get worse. Document Released: 06/24/2005 Document Revised: 01/29/2014 Document Reviewed: 11/30/2011 [facility] Patient Information 2015 [place], [place]. This information is not intended to replace advice given to you by your health care provider. Make sure you discuss any questions you have with your health care provider.

## 2015-05-23 NOTE — Addendum Note (Signed)
Addended byParticia Lather C on: 05/23/2015 02:17 PM   Modules accepted: Orders

## 2015-05-23 NOTE — Progress Notes (Signed)
Patient ID: [name], female   DOB: 1916-06-18, 81 y.o.   MRN: [id number]    PCP: Bufford Spikes, DO  Allergies  Allergen Reactions  . Sulfa Antibiotics Shortness Of Breath    Worsens asthma  . Clonidine Derivatives   . Lactose Intolerance (Gi)   . Verapamil     [facility] MAR    Chief Complaint  Patient presents with  . Follow-up    ED follow-up, examine right knee   . Immunizations    RX's printed for TDaP and Shingles Vaccine. Discuss Prevnar to be given in office      HPI: Patient is a 81 y.o. female seen in the office today to follow up ED visit after fall. Pt reports her foot got caught on a carpet and she fell onto her right side injuring her right elbow and her right knee. Taken to the ED for evaluation and xray obtained. No fracture noted. Redness occurred a few days ago around skin tear. Office notified and antibiotic prescribed however no antibiotic on MAR because order only faxed to home health nurse. Pain noted to right knee. No fever or chills.    Advanced Directive information Does patient have an advance directive?: Yes, Type of Advance Directive: Healthcare Power of Attorney, Does patient want to make changes to advanced directive?: No - Patient declined Review of Systems:  Review of Systems  Constitutional: Negative for fever and chills.  HENT: Negative for congestion and hearing loss.   Respiratory: Negative for cough and shortness of breath.   Cardiovascular: Negative for chest pain and leg swelling.  Gastrointestinal: Negative for abdominal pain.  Genitourinary: Negative for dysuria.  Skin: Positive for wound (with redness to left knee).  Neurological: Positive for weakness. Negative for dizziness.  Psychiatric/Behavioral: The patient is not nervous/anxious.     Past Medical History  Diagnosis Date  . COPD (chronic obstructive pulmonary disease)   . Hypertension   . Oral aphthae   . Orthostatic hypotension   . Senile osteoporosis   . Atrophy of vulva    . Unspecified urinary incontinence   . Urinary frequency   . Unspecified vitamin D deficiency   . Anxiety state, unspecified   . Restless legs syndrome (RLS)   . Unspecified disorder of kidney and ureter   . Atrioventricular block, unspecified   . Closed dislocation of shoulder, unspecified site   . Anemia, unspecified   . Altered mental status   . Lumbago   . Transient disorder of initiating or maintaining sleep   . Cellulitis and abscess of unspecified site   . Sebaceous cyst   . Insomnia, unspecified   . Dementia in conditions classified elsewhere without behavioral disturbance   . Depressive disorder, not elsewhere classified   . Abnormality of gait   . Adult failure to thrive   . Headache(784.0)   . Gout, unspecified   . Extrinsic asthma, unspecified   . Chronic kidney disease, stage I   . Osteoarthrosis, unspecified whether generalized or localized, unspecified site   . Spinal stenosis, unspecified region other than cervical   . Edema   . Tachycardia, unspecified    Past Surgical History  Procedure Laterality Date  . Hip surgery      (R) hip replacement  . Cesarean section    . Abdominal hysterectomy      partial  . Back surgery      fusion  . Breast surgery      bilateral breast reduction  . Eye surgery  bilateral cataract  . Basal cell carcinoma excision    . Basal cell carcinoma excision      face  . Hip closed reduction Right 08/07/2013    Procedure: ATTEMPTED CLOSED MANIPULATION HIP;  Surgeon: Jodi Marble, MD;  Location: [facility] OR;  Service: Orthopedics;  Laterality: Right;  . Total hip revision Right 08/08/2013    Procedure: TOTAL HIP REVISION- right;  Surgeon: Velna Ochs, MD;  Location: [facility] OR;  Service: Orthopedics;  Laterality: Right;   Social History:   reports that she has never smoked. She has never used smokeless tobacco. She reports that she does not drink alcohol or use illicit drugs.  Family History  Problem Relation Age of Onset   . Diabetes Mother   . Diabetes Father   . Stroke Sister   . Heart attack Brother     Medications: Patient's Medications  New Prescriptions   No medications on file  Previous Medications   ACETAMINOPHEN (TYLENOL) 325 MG TABLET    Take 650 mg by mouth every 6 (six) hours as needed for mild pain.    CAPSICUM OLEORESIN (TRIXAICIN) 0.025 % CREAM    Apply to right shoulder and upper arm three times daily   CEPHALEXIN (KEFLEX) 500 MG CAPSULE    Take 500 mg by mouth 3 (three) times daily. End 06/10/15   CHOLECALCIFEROL (VITAMIN D) 1000 UNITS TABLET    Take 1,000 Units by mouth daily.   CLONAZEPAM (KLONOPIN) 1 MG TABLET    Take one tablet by mouth at bedtime for anxiety   DOCUSATE SODIUM (COLACE) 100 MG CAPSULE    Take 100 mg by mouth 2 (two) times daily.   FUROSEMIDE (LASIX) 20 MG TABLET    Take 1 tablet (20 mg total) by mouth every other day.   HYDROCODONE-ACETAMINOPHEN (NORCO/VICODIN) 5-325 MG PER TABLET    Take one to two tablets by mouth twice daily for pain   LISINOPRIL (PRINIVIL,ZESTRIL) 40 MG TABLET    Take 40 mg by mouth daily.   MIRTAZAPINE (REMERON) 7.5 MG TABLET    Take 1 tablet (7.5 mg total) by mouth at bedtime.   POLYETHYL GLYCOL-PROPYL GLYCOL (SYSTANE) 0.4-0.3 % SOLN    Instill one drop into each eye two times daily to relieve dry,itchy eye.   POTASSIUM CHLORIDE SA (K-DUR,KLOR-CON) 20 MEQ TABLET    Take 1 tablet (20 mEq total) by mouth every other day. For chf   SENNA-DOCUSATE (SENOKOT-S) 8.6-50 MG PER TABLET    Take 1 tablet by mouth daily.  Modified Medications   Modified Medication Previous Medication   TDAP (BOOSTRIX) 5-2.5-18.5 LF-MCG/0.5 INJECTION Tdap (BOOSTRIX) 5-2.5-18.5 LF-MCG/0.5 injection      Inject 0.5 mLs into the muscle once.    Inject 0.5 mLs into the muscle once.   ZOSTER VACCINE LIVE, PF, (ZOSTAVAX) 16109 UNT/0.65ML INJECTION zoster vaccine live, PF, (ZOSTAVAX) 60454 UNT/0.65ML injection      Inject 19,400 Units into the skin once.    Inject 0.65 mLs into the  skin once.  Discontinued Medications   DOXYCYCLINE (VIBRA-TABS) 100 MG TABLET    Take 1 tablet (100 mg total) by mouth 2 (two) times daily.   SACCHAROMYCES BOULARDII (FLORASTOR) 250 MG CAPSULE    Take 1 capsule (250 mg total) by mouth 2 (two) times daily.     Physical Exam:  Filed Vitals:   05/23/15 1311  BP: 132/70  Pulse: 43  Temp: 97.8 F (36.6 C)  TempSrc: Oral  Resp: 14  Height: 4\' 8"  (1.422  m)  Weight: 103 lb (46.72 kg)  SpO2: 94%    Physical Exam  Constitutional:  Frail white female   Cardiovascular: Regular rhythm, normal heart sounds and intact distal pulses.   bradycardic  Pulmonary/Chest: Effort normal. She has no rales.  Abdominal: Soft. Bowel sounds are normal.  Neurological: She is alert.  Skin:  Left knee with skin tear 4x 3 with flap, warm erythema surrounding area  Psychiatric: She has a normal mood and affect.    Labs reviewed: Basic Metabolic Panel:  Recent Labs  16/10/96 0842 11/04/14 0500 01/08/15 1723  NA 141 140 145  K 4.4 4.2 4.7  CL 107 107 111  CO2 GLUCOSE 89 97 95  BUN 26* 18 30*  CREATININE 1.00 0.97 1.07  CALCIUM 9.2 9.1 9.4  MG  --   --  2.0  PHOS  --   --  4.2  TSH  --   --  1.378   Liver Function Tests:  Recent Labs  11/03/14 0842 11/04/14 0500 01/08/15 1723  AST ALT ALKPHOS 74 70 73  BILITOT 0.5 1.0 0.5  PROT 6.6 5.9* 6.3  ALBUMIN 3.8 3.5 3.7   No results for input(s): LIPASE, AMYLASE in the last 8760 hours. No results for input(s): AMMONIA in the last 8760 hours. CBC:  Recent Labs  11/03/14 0842 11/04/14 0500 01/08/15 1723  WBC 8.3 9.0 6.0  NEUTROABS 5.6  --  3.0  HGB 12.1 12.9 11.7*  HCT 36.5 39.2 35.7*  MCV 86.3 86.7 87.1  PLT 222 233 229   Lipid Panel: No results for input(s): CHOL, HDL, LDLCALC, TRIG, CHOLHDL, LDLDIRECT in the last 8760 hours. TSH:  Recent Labs  01/08/15 1723  TSH 1.378   A1C: No results found for: HGBA1C   Assessment/Plan 1.  Cellulitis of right lower extremity -was not started on keflex, will change medication to doxycyline twice daily for 10 days.  - doxycycline (VIBRA-TABS) 100 MG tablet; Take 1 tablet (100 mg total) by mouth 2 (two) times daily.  Dispense: 20 tablet; Refill: 0 - saccharomyces boulardii (FLORASTOR) 250 MG capsule; Take 1 capsule (250 mg total) by mouth 2 (two) times daily.  Dispense: 30 capsule; Refill: 0  2. Abrasion Skin tear after fall, now with infection to surrounding tissue. nursing following in facility for wound care.   Follow up if area does not improve or worsens.    Janene Harvey. Biagio Borg  [facility] [phone] 8 am - 5 pm) [phone] (after hours)

## 2015-05-25 DIAGNOSIS — J449 Chronic obstructive pulmonary disease, unspecified: Secondary | ICD-10-CM | POA: Diagnosis not present

## 2015-05-25 DIAGNOSIS — I129 Hypertensive chronic kidney disease with stage 1 through stage 4 chronic kidney disease, or unspecified chronic kidney disease: Secondary | ICD-10-CM | POA: Diagnosis not present

## 2015-05-25 DIAGNOSIS — S51011A Laceration without foreign body of right elbow, initial encounter: Secondary | ICD-10-CM | POA: Diagnosis not present

## 2015-05-25 DIAGNOSIS — M4800 Spinal stenosis, site unspecified: Secondary | ICD-10-CM | POA: Diagnosis not present

## 2015-05-25 DIAGNOSIS — Z4800 Encounter for change or removal of nonsurgical wound dressing: Secondary | ICD-10-CM | POA: Diagnosis not present

## 2015-05-25 DIAGNOSIS — L03115 Cellulitis of right lower limb: Secondary | ICD-10-CM | POA: Diagnosis not present

## 2015-05-25 DIAGNOSIS — G309 Alzheimer's disease, unspecified: Secondary | ICD-10-CM | POA: Diagnosis not present

## 2015-05-25 DIAGNOSIS — S81011A Laceration without foreign body, right knee, initial encounter: Secondary | ICD-10-CM | POA: Diagnosis not present

## 2015-05-25 DIAGNOSIS — J45909 Unspecified asthma, uncomplicated: Secondary | ICD-10-CM | POA: Diagnosis not present

## 2015-05-25 DIAGNOSIS — E119 Type 2 diabetes mellitus without complications: Secondary | ICD-10-CM | POA: Diagnosis not present

## 2015-05-25 DIAGNOSIS — R2681 Unsteadiness on feet: Secondary | ICD-10-CM | POA: Diagnosis not present

## 2015-05-25 DIAGNOSIS — F0280 Dementia in other diseases classified elsewhere without behavioral disturbance: Secondary | ICD-10-CM | POA: Diagnosis not present

## 2015-05-25 DIAGNOSIS — N181 Chronic kidney disease, stage 1: Secondary | ICD-10-CM | POA: Diagnosis not present

## 2015-05-25 DIAGNOSIS — Z9181 History of falling: Secondary | ICD-10-CM | POA: Diagnosis not present

## 2015-05-28 DIAGNOSIS — E119 Type 2 diabetes mellitus without complications: Secondary | ICD-10-CM | POA: Diagnosis not present

## 2015-05-28 DIAGNOSIS — I129 Hypertensive chronic kidney disease with stage 1 through stage 4 chronic kidney disease, or unspecified chronic kidney disease: Secondary | ICD-10-CM | POA: Diagnosis not present

## 2015-05-28 DIAGNOSIS — M4800 Spinal stenosis, site unspecified: Secondary | ICD-10-CM | POA: Diagnosis not present

## 2015-05-28 DIAGNOSIS — J45909 Unspecified asthma, uncomplicated: Secondary | ICD-10-CM | POA: Diagnosis not present

## 2015-05-28 DIAGNOSIS — S51011A Laceration without foreign body of right elbow, initial encounter: Secondary | ICD-10-CM | POA: Diagnosis not present

## 2015-05-28 DIAGNOSIS — R2681 Unsteadiness on feet: Secondary | ICD-10-CM | POA: Diagnosis not present

## 2015-05-28 DIAGNOSIS — J449 Chronic obstructive pulmonary disease, unspecified: Secondary | ICD-10-CM | POA: Diagnosis not present

## 2015-05-28 DIAGNOSIS — S81011A Laceration without foreign body, right knee, initial encounter: Secondary | ICD-10-CM | POA: Diagnosis not present

## 2015-05-28 DIAGNOSIS — G309 Alzheimer's disease, unspecified: Secondary | ICD-10-CM | POA: Diagnosis not present

## 2015-05-28 DIAGNOSIS — L03115 Cellulitis of right lower limb: Secondary | ICD-10-CM | POA: Diagnosis not present

## 2015-05-28 DIAGNOSIS — F0280 Dementia in other diseases classified elsewhere without behavioral disturbance: Secondary | ICD-10-CM | POA: Diagnosis not present

## 2015-05-28 DIAGNOSIS — Z4800 Encounter for change or removal of nonsurgical wound dressing: Secondary | ICD-10-CM | POA: Diagnosis not present

## 2015-05-28 DIAGNOSIS — N181 Chronic kidney disease, stage 1: Secondary | ICD-10-CM | POA: Diagnosis not present

## 2015-05-28 DIAGNOSIS — Z9181 History of falling: Secondary | ICD-10-CM | POA: Diagnosis not present

## 2015-05-29 DIAGNOSIS — I129 Hypertensive chronic kidney disease with stage 1 through stage 4 chronic kidney disease, or unspecified chronic kidney disease: Secondary | ICD-10-CM | POA: Diagnosis not present

## 2015-05-29 DIAGNOSIS — Z4800 Encounter for change or removal of nonsurgical wound dressing: Secondary | ICD-10-CM | POA: Diagnosis not present

## 2015-05-29 DIAGNOSIS — L03115 Cellulitis of right lower limb: Secondary | ICD-10-CM | POA: Diagnosis not present

## 2015-05-29 DIAGNOSIS — R2681 Unsteadiness on feet: Secondary | ICD-10-CM | POA: Diagnosis not present

## 2015-05-29 DIAGNOSIS — E119 Type 2 diabetes mellitus without complications: Secondary | ICD-10-CM | POA: Diagnosis not present

## 2015-05-29 DIAGNOSIS — F0280 Dementia in other diseases classified elsewhere without behavioral disturbance: Secondary | ICD-10-CM | POA: Diagnosis not present

## 2015-05-29 DIAGNOSIS — M4800 Spinal stenosis, site unspecified: Secondary | ICD-10-CM | POA: Diagnosis not present

## 2015-05-29 DIAGNOSIS — N181 Chronic kidney disease, stage 1: Secondary | ICD-10-CM | POA: Diagnosis not present

## 2015-05-29 DIAGNOSIS — J45909 Unspecified asthma, uncomplicated: Secondary | ICD-10-CM | POA: Diagnosis not present

## 2015-05-29 DIAGNOSIS — J449 Chronic obstructive pulmonary disease, unspecified: Secondary | ICD-10-CM | POA: Diagnosis not present

## 2015-05-29 DIAGNOSIS — Z9181 History of falling: Secondary | ICD-10-CM | POA: Diagnosis not present

## 2015-05-29 DIAGNOSIS — G309 Alzheimer's disease, unspecified: Secondary | ICD-10-CM | POA: Diagnosis not present

## 2015-05-29 DIAGNOSIS — S81011A Laceration without foreign body, right knee, initial encounter: Secondary | ICD-10-CM | POA: Diagnosis not present

## 2015-05-29 DIAGNOSIS — S51011A Laceration without foreign body of right elbow, initial encounter: Secondary | ICD-10-CM | POA: Diagnosis not present

## 2015-05-30 DIAGNOSIS — F0280 Dementia in other diseases classified elsewhere without behavioral disturbance: Secondary | ICD-10-CM | POA: Diagnosis not present

## 2015-05-30 DIAGNOSIS — Z4800 Encounter for change or removal of nonsurgical wound dressing: Secondary | ICD-10-CM | POA: Diagnosis not present

## 2015-05-30 DIAGNOSIS — S51011A Laceration without foreign body of right elbow, initial encounter: Secondary | ICD-10-CM | POA: Diagnosis not present

## 2015-05-30 DIAGNOSIS — J45909 Unspecified asthma, uncomplicated: Secondary | ICD-10-CM | POA: Diagnosis not present

## 2015-05-30 DIAGNOSIS — Z9181 History of falling: Secondary | ICD-10-CM | POA: Diagnosis not present

## 2015-05-30 DIAGNOSIS — E119 Type 2 diabetes mellitus without complications: Secondary | ICD-10-CM | POA: Diagnosis not present

## 2015-05-30 DIAGNOSIS — G309 Alzheimer's disease, unspecified: Secondary | ICD-10-CM | POA: Diagnosis not present

## 2015-05-30 DIAGNOSIS — S81011A Laceration without foreign body, right knee, initial encounter: Secondary | ICD-10-CM | POA: Diagnosis not present

## 2015-05-30 DIAGNOSIS — L03115 Cellulitis of right lower limb: Secondary | ICD-10-CM | POA: Diagnosis not present

## 2015-05-30 DIAGNOSIS — R2681 Unsteadiness on feet: Secondary | ICD-10-CM | POA: Diagnosis not present

## 2015-05-30 DIAGNOSIS — I129 Hypertensive chronic kidney disease with stage 1 through stage 4 chronic kidney disease, or unspecified chronic kidney disease: Secondary | ICD-10-CM | POA: Diagnosis not present

## 2015-05-30 DIAGNOSIS — J449 Chronic obstructive pulmonary disease, unspecified: Secondary | ICD-10-CM | POA: Diagnosis not present

## 2015-05-30 DIAGNOSIS — M4800 Spinal stenosis, site unspecified: Secondary | ICD-10-CM | POA: Diagnosis not present

## 2015-05-30 DIAGNOSIS — N181 Chronic kidney disease, stage 1: Secondary | ICD-10-CM | POA: Diagnosis not present

## 2015-05-31 ENCOUNTER — Telehealth: Payer: Self-pay

## 2015-05-31 DIAGNOSIS — M4800 Spinal stenosis, site unspecified: Secondary | ICD-10-CM | POA: Diagnosis not present

## 2015-05-31 DIAGNOSIS — R2681 Unsteadiness on feet: Secondary | ICD-10-CM | POA: Diagnosis not present

## 2015-05-31 DIAGNOSIS — J45909 Unspecified asthma, uncomplicated: Secondary | ICD-10-CM | POA: Diagnosis not present

## 2015-05-31 DIAGNOSIS — Z9181 History of falling: Secondary | ICD-10-CM | POA: Diagnosis not present

## 2015-05-31 DIAGNOSIS — L03115 Cellulitis of right lower limb: Secondary | ICD-10-CM | POA: Diagnosis not present

## 2015-05-31 DIAGNOSIS — N181 Chronic kidney disease, stage 1: Secondary | ICD-10-CM | POA: Diagnosis not present

## 2015-05-31 DIAGNOSIS — S81011A Laceration without foreign body, right knee, initial encounter: Secondary | ICD-10-CM | POA: Diagnosis not present

## 2015-05-31 DIAGNOSIS — S51011A Laceration without foreign body of right elbow, initial encounter: Secondary | ICD-10-CM | POA: Diagnosis not present

## 2015-05-31 DIAGNOSIS — I129 Hypertensive chronic kidney disease with stage 1 through stage 4 chronic kidney disease, or unspecified chronic kidney disease: Secondary | ICD-10-CM | POA: Diagnosis not present

## 2015-05-31 DIAGNOSIS — J449 Chronic obstructive pulmonary disease, unspecified: Secondary | ICD-10-CM | POA: Diagnosis not present

## 2015-05-31 DIAGNOSIS — Z4800 Encounter for change or removal of nonsurgical wound dressing: Secondary | ICD-10-CM | POA: Diagnosis not present

## 2015-05-31 DIAGNOSIS — E119 Type 2 diabetes mellitus without complications: Secondary | ICD-10-CM | POA: Diagnosis not present

## 2015-05-31 DIAGNOSIS — G309 Alzheimer's disease, unspecified: Secondary | ICD-10-CM | POA: Diagnosis not present

## 2015-05-31 DIAGNOSIS — F0280 Dementia in other diseases classified elsewhere without behavioral disturbance: Secondary | ICD-10-CM | POA: Diagnosis not present

## 2015-05-31 NOTE — Telephone Encounter (Signed)
[name] Therapy from [facility] pulse 48, bp 150/78, patient asymptomatic. She was having patient go from one chair to another, to another, up and down. Show pulse to Dr. Renato Gails, noted, her pulse is usually 41, 37, 40, 43.

## 2015-06-04 DIAGNOSIS — Z9181 History of falling: Secondary | ICD-10-CM | POA: Diagnosis not present

## 2015-06-04 DIAGNOSIS — L03115 Cellulitis of right lower limb: Secondary | ICD-10-CM | POA: Diagnosis not present

## 2015-06-04 DIAGNOSIS — R2681 Unsteadiness on feet: Secondary | ICD-10-CM | POA: Diagnosis not present

## 2015-06-04 DIAGNOSIS — G309 Alzheimer's disease, unspecified: Secondary | ICD-10-CM | POA: Diagnosis not present

## 2015-06-04 DIAGNOSIS — E119 Type 2 diabetes mellitus without complications: Secondary | ICD-10-CM | POA: Diagnosis not present

## 2015-06-04 DIAGNOSIS — J45909 Unspecified asthma, uncomplicated: Secondary | ICD-10-CM | POA: Diagnosis not present

## 2015-06-04 DIAGNOSIS — F0280 Dementia in other diseases classified elsewhere without behavioral disturbance: Secondary | ICD-10-CM | POA: Diagnosis not present

## 2015-06-04 DIAGNOSIS — Z4800 Encounter for change or removal of nonsurgical wound dressing: Secondary | ICD-10-CM | POA: Diagnosis not present

## 2015-06-04 DIAGNOSIS — M4800 Spinal stenosis, site unspecified: Secondary | ICD-10-CM | POA: Diagnosis not present

## 2015-06-04 DIAGNOSIS — N181 Chronic kidney disease, stage 1: Secondary | ICD-10-CM | POA: Diagnosis not present

## 2015-06-04 DIAGNOSIS — S51011A Laceration without foreign body of right elbow, initial encounter: Secondary | ICD-10-CM | POA: Diagnosis not present

## 2015-06-04 DIAGNOSIS — S81011A Laceration without foreign body, right knee, initial encounter: Secondary | ICD-10-CM | POA: Diagnosis not present

## 2015-06-04 DIAGNOSIS — J449 Chronic obstructive pulmonary disease, unspecified: Secondary | ICD-10-CM | POA: Diagnosis not present

## 2015-06-04 DIAGNOSIS — I129 Hypertensive chronic kidney disease with stage 1 through stage 4 chronic kidney disease, or unspecified chronic kidney disease: Secondary | ICD-10-CM | POA: Diagnosis not present

## 2015-06-06 DIAGNOSIS — Z9181 History of falling: Secondary | ICD-10-CM | POA: Diagnosis not present

## 2015-06-06 DIAGNOSIS — N181 Chronic kidney disease, stage 1: Secondary | ICD-10-CM | POA: Diagnosis not present

## 2015-06-06 DIAGNOSIS — L03115 Cellulitis of right lower limb: Secondary | ICD-10-CM | POA: Diagnosis not present

## 2015-06-06 DIAGNOSIS — F0280 Dementia in other diseases classified elsewhere without behavioral disturbance: Secondary | ICD-10-CM | POA: Diagnosis not present

## 2015-06-06 DIAGNOSIS — R2681 Unsteadiness on feet: Secondary | ICD-10-CM | POA: Diagnosis not present

## 2015-06-06 DIAGNOSIS — S51011A Laceration without foreign body of right elbow, initial encounter: Secondary | ICD-10-CM | POA: Diagnosis not present

## 2015-06-06 DIAGNOSIS — E119 Type 2 diabetes mellitus without complications: Secondary | ICD-10-CM | POA: Diagnosis not present

## 2015-06-06 DIAGNOSIS — G309 Alzheimer's disease, unspecified: Secondary | ICD-10-CM | POA: Diagnosis not present

## 2015-06-06 DIAGNOSIS — J449 Chronic obstructive pulmonary disease, unspecified: Secondary | ICD-10-CM | POA: Diagnosis not present

## 2015-06-06 DIAGNOSIS — I129 Hypertensive chronic kidney disease with stage 1 through stage 4 chronic kidney disease, or unspecified chronic kidney disease: Secondary | ICD-10-CM | POA: Diagnosis not present

## 2015-06-06 DIAGNOSIS — M4800 Spinal stenosis, site unspecified: Secondary | ICD-10-CM | POA: Diagnosis not present

## 2015-06-06 DIAGNOSIS — S81011A Laceration without foreign body, right knee, initial encounter: Secondary | ICD-10-CM | POA: Diagnosis not present

## 2015-06-06 DIAGNOSIS — Z4800 Encounter for change or removal of nonsurgical wound dressing: Secondary | ICD-10-CM | POA: Diagnosis not present

## 2015-06-06 DIAGNOSIS — J45909 Unspecified asthma, uncomplicated: Secondary | ICD-10-CM | POA: Diagnosis not present

## 2015-06-07 ENCOUNTER — Telehealth: Payer: Self-pay | Admitting: *Deleted

## 2015-06-07 NOTE — Telephone Encounter (Signed)
Dr. Renato Gails agrees with referral to Hospice and Palliative Care due to Failure to Thrive/Protein Cal Malntr/ Dementia/Frailty Faxed signed order to [facility] at Facility Fax: [phone] Spoke with daughter.

## 2015-06-07 NOTE — Telephone Encounter (Signed)
Daughter, [name] called and wants an Hospice Order for her mother [name]. Daughter stated that her mother has had some changes in the last couple of days and doesn't want to be treated by the hospital. Please Advise. Salome ArntArtist Pais  Fax: [phone] Printed and Given to Dr. Renato Gails to review and sign.

## 2015-06-10 DIAGNOSIS — F0280 Dementia in other diseases classified elsewhere without behavioral disturbance: Secondary | ICD-10-CM | POA: Diagnosis not present

## 2015-06-10 DIAGNOSIS — N181 Chronic kidney disease, stage 1: Secondary | ICD-10-CM | POA: Diagnosis not present

## 2015-06-10 DIAGNOSIS — M4800 Spinal stenosis, site unspecified: Secondary | ICD-10-CM | POA: Diagnosis not present

## 2015-06-10 DIAGNOSIS — Z4800 Encounter for change or removal of nonsurgical wound dressing: Secondary | ICD-10-CM | POA: Diagnosis not present

## 2015-06-10 DIAGNOSIS — G309 Alzheimer's disease, unspecified: Secondary | ICD-10-CM | POA: Diagnosis not present

## 2015-06-10 DIAGNOSIS — J449 Chronic obstructive pulmonary disease, unspecified: Secondary | ICD-10-CM | POA: Diagnosis not present

## 2015-06-10 DIAGNOSIS — I129 Hypertensive chronic kidney disease with stage 1 through stage 4 chronic kidney disease, or unspecified chronic kidney disease: Secondary | ICD-10-CM | POA: Diagnosis not present

## 2015-06-10 DIAGNOSIS — S51011A Laceration without foreign body of right elbow, initial encounter: Secondary | ICD-10-CM | POA: Diagnosis not present

## 2015-06-10 DIAGNOSIS — R2681 Unsteadiness on feet: Secondary | ICD-10-CM | POA: Diagnosis not present

## 2015-06-10 DIAGNOSIS — E119 Type 2 diabetes mellitus without complications: Secondary | ICD-10-CM | POA: Diagnosis not present

## 2015-06-10 DIAGNOSIS — L03115 Cellulitis of right lower limb: Secondary | ICD-10-CM | POA: Diagnosis not present

## 2015-06-10 DIAGNOSIS — J45909 Unspecified asthma, uncomplicated: Secondary | ICD-10-CM | POA: Diagnosis not present

## 2015-06-10 DIAGNOSIS — S81011A Laceration without foreign body, right knee, initial encounter: Secondary | ICD-10-CM | POA: Diagnosis not present

## 2015-06-10 DIAGNOSIS — Z9181 History of falling: Secondary | ICD-10-CM | POA: Diagnosis not present

## 2015-06-11 ENCOUNTER — Encounter (HOSPITAL_COMMUNITY): Payer: Self-pay | Admitting: Dentistry

## 2015-06-11 DIAGNOSIS — M4800 Spinal stenosis, site unspecified: Secondary | ICD-10-CM | POA: Diagnosis not present

## 2015-06-11 DIAGNOSIS — L03115 Cellulitis of right lower limb: Secondary | ICD-10-CM | POA: Diagnosis not present

## 2015-06-11 DIAGNOSIS — F0280 Dementia in other diseases classified elsewhere without behavioral disturbance: Secondary | ICD-10-CM | POA: Diagnosis not present

## 2015-06-11 DIAGNOSIS — S51011A Laceration without foreign body of right elbow, initial encounter: Secondary | ICD-10-CM | POA: Diagnosis not present

## 2015-06-11 DIAGNOSIS — S81011A Laceration without foreign body, right knee, initial encounter: Secondary | ICD-10-CM | POA: Diagnosis not present

## 2015-06-11 DIAGNOSIS — E119 Type 2 diabetes mellitus without complications: Secondary | ICD-10-CM | POA: Diagnosis not present

## 2015-06-11 DIAGNOSIS — Z9181 History of falling: Secondary | ICD-10-CM | POA: Diagnosis not present

## 2015-06-11 DIAGNOSIS — G309 Alzheimer's disease, unspecified: Secondary | ICD-10-CM | POA: Diagnosis not present

## 2015-06-11 DIAGNOSIS — J449 Chronic obstructive pulmonary disease, unspecified: Secondary | ICD-10-CM | POA: Diagnosis not present

## 2015-06-11 DIAGNOSIS — J45909 Unspecified asthma, uncomplicated: Secondary | ICD-10-CM | POA: Diagnosis not present

## 2015-06-11 DIAGNOSIS — N181 Chronic kidney disease, stage 1: Secondary | ICD-10-CM | POA: Diagnosis not present

## 2015-06-11 DIAGNOSIS — I129 Hypertensive chronic kidney disease with stage 1 through stage 4 chronic kidney disease, or unspecified chronic kidney disease: Secondary | ICD-10-CM | POA: Diagnosis not present

## 2015-06-11 DIAGNOSIS — Z4800 Encounter for change or removal of nonsurgical wound dressing: Secondary | ICD-10-CM | POA: Diagnosis not present

## 2015-06-11 DIAGNOSIS — R2681 Unsteadiness on feet: Secondary | ICD-10-CM | POA: Diagnosis not present

## 2015-06-13 DIAGNOSIS — S51011A Laceration without foreign body of right elbow, initial encounter: Secondary | ICD-10-CM | POA: Diagnosis not present

## 2015-06-13 DIAGNOSIS — J449 Chronic obstructive pulmonary disease, unspecified: Secondary | ICD-10-CM | POA: Diagnosis not present

## 2015-06-13 DIAGNOSIS — F0280 Dementia in other diseases classified elsewhere without behavioral disturbance: Secondary | ICD-10-CM | POA: Diagnosis not present

## 2015-06-13 DIAGNOSIS — E119 Type 2 diabetes mellitus without complications: Secondary | ICD-10-CM | POA: Diagnosis not present

## 2015-06-13 DIAGNOSIS — I129 Hypertensive chronic kidney disease with stage 1 through stage 4 chronic kidney disease, or unspecified chronic kidney disease: Secondary | ICD-10-CM | POA: Diagnosis not present

## 2015-06-13 DIAGNOSIS — J45909 Unspecified asthma, uncomplicated: Secondary | ICD-10-CM | POA: Diagnosis not present

## 2015-06-13 DIAGNOSIS — R2681 Unsteadiness on feet: Secondary | ICD-10-CM | POA: Diagnosis not present

## 2015-06-13 DIAGNOSIS — Z9181 History of falling: Secondary | ICD-10-CM | POA: Diagnosis not present

## 2015-06-13 DIAGNOSIS — M4800 Spinal stenosis, site unspecified: Secondary | ICD-10-CM | POA: Diagnosis not present

## 2015-06-13 DIAGNOSIS — L03115 Cellulitis of right lower limb: Secondary | ICD-10-CM | POA: Diagnosis not present

## 2015-06-13 DIAGNOSIS — G309 Alzheimer's disease, unspecified: Secondary | ICD-10-CM | POA: Diagnosis not present

## 2015-06-13 DIAGNOSIS — Z4800 Encounter for change or removal of nonsurgical wound dressing: Secondary | ICD-10-CM | POA: Diagnosis not present

## 2015-06-13 DIAGNOSIS — N181 Chronic kidney disease, stage 1: Secondary | ICD-10-CM | POA: Diagnosis not present

## 2015-06-13 DIAGNOSIS — S81011A Laceration without foreign body, right knee, initial encounter: Secondary | ICD-10-CM | POA: Diagnosis not present

## 2015-07-01 ENCOUNTER — Encounter (HOSPITAL_COMMUNITY): Payer: Self-pay | Admitting: Dentistry

## 2015-07-01 ENCOUNTER — Ambulatory Visit (HOSPITAL_COMMUNITY): Payer: Medicaid - Dental | Admitting: Dentistry

## 2015-07-01 ENCOUNTER — Other Ambulatory Visit: Payer: Self-pay | Admitting: *Deleted

## 2015-07-01 VITALS — BP 187/48 | HR 40 | Temp 97.5°F

## 2015-07-01 DIAGNOSIS — I10 Essential (primary) hypertension: Secondary | ICD-10-CM

## 2015-07-01 DIAGNOSIS — IMO0002 Reserved for concepts with insufficient information to code with codable children: Secondary | ICD-10-CM

## 2015-07-01 DIAGNOSIS — K0530 Chronic periodontitis, unspecified: Secondary | ICD-10-CM

## 2015-07-01 DIAGNOSIS — K117 Disturbances of salivary secretion: Secondary | ICD-10-CM

## 2015-07-01 DIAGNOSIS — R682 Dry mouth, unspecified: Secondary | ICD-10-CM

## 2015-07-01 DIAGNOSIS — M264 Malocclusion, unspecified: Secondary | ICD-10-CM

## 2015-07-01 DIAGNOSIS — K08409 Partial loss of teeth, unspecified cause, unspecified class: Secondary | ICD-10-CM

## 2015-07-01 DIAGNOSIS — K036 Deposits [accretions] on teeth: Secondary | ICD-10-CM

## 2015-07-01 DIAGNOSIS — T148XXA Other injury of unspecified body region, initial encounter: Secondary | ICD-10-CM

## 2015-07-01 MED ORDER — HYDROCODONE-ACETAMINOPHEN 5-325 MG PO TABS: ORAL_TABLET | ORAL | Status: DC

## 2015-07-01 NOTE — Telephone Encounter (Signed)
[facility] 

## 2015-07-01 NOTE — Patient Instructions (Signed)
1. Brush after meals and at bedtime. Floss at bedtime. 2. Return to clinic in 6 months as scheduled. Call if problems arise before then. Dr. Kristin Bruins

## 2015-07-01 NOTE — Progress Notes (Addendum)
Periodic oral examination  Date of Exam:   07/01/2015 Patient Name:   [name] Date of Birth:   May 12, 1916 Medical Record Number: [id number]  VITALS: BP 187/48 mmHg  Pulse 40  Temp(Src) 97.5 F (36.4 C) (Oral) Heart rate noted and is consistent with previous values. Elavated blood pressure noted as well. Reviewed previous vitals signs in [facility].  CHIEF COMPLAINT: Patient seen for periodic dental examination and cleaning.  HPI: [name] is a 81 year old that presents for periodical examination and dental cleaning.  The patient has multiple medical problems and currently resides at [facility]  The patient does have a previous right hip replacement and Dr. Jerl Santos was previously contacted and indicated that antibiotic premedication prior to invasive dental procedures was NOT indicated. Patient had been seen in July and August 2016 for leg wounds and was treated with doxycycline antibiotic therapy. The patient currently denies acute toothache, swellings, or abscesses. Patient is complaining of some sensitivity of the upper right premolar. Patient again denies toothache-type symptoms.  Patient was last seen by Dental Medicine for an exam and cleaning in March 2016.    PROBLEM LIST: Patient Active Problem List   Diagnosis Date Noted  . Bradycardia 12/17/2014  . Inability to ambulate due to knee 11/03/2014  . Wound, open, leg 08/01/2014  . Severe malnutrition (HCC) 08/14/2013    Class: Chronic  . PNA (pneumonia) 08/10/2013  . Cough 08/09/2013  . Failed total hip arthroplasty with dislocation (HCC) 08/08/2013    Class: Acute  . Other malaise and fatigue 03/25/2013  . Senile osteoporosis   . Vitamin D deficiency   . Closed dislocation of shoulder, unspecified site   . Spinal stenosis, lumbar region, with neurogenic claudication   . Alzheimer's disease 02/06/2010  . Essential hypertension 02/06/2010  . ASTHMA, UNSPECIFIED, UNSPECIFIED STATUS  02/06/2010  . ABNORMAL EKG 02/06/2010    PMH: Past Medical History  Diagnosis Date  . COPD (chronic obstructive pulmonary disease) (HCC)   . Hypertension   . Oral aphthae   . Orthostatic hypotension   . Senile osteoporosis   . Atrophy of vulva   . Unspecified urinary incontinence   . Urinary frequency   . Unspecified vitamin D deficiency   . Anxiety state, unspecified   . Restless legs syndrome (RLS)   . Unspecified disorder of kidney and ureter   . Atrioventricular block, unspecified   . Closed dislocation of shoulder, unspecified site   . Anemia, unspecified   . Altered mental status   . Lumbago   . Transient disorder of initiating or maintaining sleep   . Cellulitis and abscess of unspecified site   . Sebaceous cyst   . Insomnia, unspecified   . Dementia in conditions classified elsewhere without behavioral disturbance   . Depressive disorder, not elsewhere classified   . Abnormality of gait   . Adult failure to thrive   . Headache(784.0)   . Gout, unspecified   . Extrinsic asthma, unspecified   . Chronic kidney disease, stage I   . Osteoarthrosis, unspecified whether generalized or localized, unspecified site   . Spinal stenosis, unspecified region other than cervical   . Edema   . Tachycardia, unspecified     PSH: Past Surgical History  Procedure Laterality Date  . Hip surgery      (R) hip replacement  . Cesarean section    . Abdominal hysterectomy      partial  . Back surgery      fusion  .  Breast surgery      bilateral breast reduction  . Eye surgery      bilateral cataract  . Basal cell carcinoma excision    . Basal cell carcinoma excision      face  . Hip closed reduction Right 08/07/2013    Procedure: ATTEMPTED CLOSED MANIPULATION HIP;  Surgeon: Jodi Marble, MD;  Location: [facility] OR;  Service: Orthopedics;  Laterality: Right;  . Total hip revision Right 08/08/2013    Procedure: TOTAL HIP REVISION- right;  Surgeon: Velna Ochs, MD;   Location: [facility] OR;  Service: Orthopedics;  Laterality: Right;    ALLERGIES: Allergies  Allergen Reactions  . Sulfa Antibiotics Shortness Of Breath    Worsens asthma  . Clonidine Derivatives   . Lactose Intolerance (Gi)   . Verapamil     NH MAR    MEDICATIONS: Current Outpatient Prescriptions  Medication Sig Dispense Refill  . acetaminophen (TYLENOL) 325 MG tablet Take 650 mg by mouth every 6 (six) hours as needed for mild pain.     . capsicum oleoresin (TRIXAICIN) 0.025 % cream Apply to right shoulder and upper arm three times daily (Patient taking differently: Apply 1 application topically 3 (three) times daily. Apply to right shoulder/upper arm) 56.6 g 0  . cholecalciferol (VITAMIN D) 1000 UNITS tablet Take 1,000 Units by mouth daily.    . clonazePAM (KLONOPIN) 1 MG tablet Take one tablet by mouth at bedtime for anxiety (Patient taking differently: Take 1 mg by mouth at bedtime. ) 30 tablet 3  . docusate sodium (COLACE) 100 MG capsule Take 100 mg by mouth 2 (two) times daily.    . furosemide (LASIX) 20 MG tablet Take 1 tablet (20 mg total) by mouth every other day. 45 tablet 3  . HYDROcodone-acetaminophen (NORCO/VICODIN) 5-325 MG per tablet Take one to two tablets by mouth twice daily for pain (Patient taking differently: Take 2 tablets by mouth 2 (two) times daily. ) 120 tablet 0  . lisinopril (PRINIVIL,ZESTRIL) 40 MG tablet Take 40 mg by mouth daily.    . mirtazapine (REMERON) 7.5 MG tablet Take 1 tablet (7.5 mg total) by mouth at bedtime. 90 tablet 3  . Polyethyl Glycol-Propyl Glycol (SYSTANE) 0.4-0.3 % SOLN Instill one drop into each eye two times daily to relieve dry,itchy eye. (Patient taking differently: Place 1 % into both eyes 2 (two) times daily. to relieve dry,itchy eye.) 30 mL 5  . potassium chloride SA (K-DUR,KLOR-CON) 20 MEQ tablet Take 1 tablet (20 mEq total) by mouth every other day. For chf 45 tablet 3  . senna-docusate (SENOKOT-S) 8.6-50 MG per tablet Take 1 tablet by  mouth daily.    Marland Kitchen doxycycline (VIBRA-TABS) 100 MG tablet Take 1 tablet (100 mg total) by mouth 2 (two) times daily. (Patient not taking: Reported on 07/01/2015) 20 tablet 0  . saccharomyces boulardii (FLORASTOR) 250 MG capsule Take 1 capsule (250 mg total) by mouth 2 (two) times daily. (Patient not taking: Reported on 07/01/2015) 30 capsule 0  . Tdap (BOOSTRIX) 5-2.5-18.5 LF-MCG/0.5 injection Inject 0.5 mLs into the muscle once. (Patient not taking: Reported on 07/01/2015) 0.5 mL 0  . zoster vaccine live, PF, (ZOSTAVAX) 16109 UNT/0.65ML injection Inject 19,400 Units into the skin once. (Patient not taking: Reported on 07/01/2015) 1 each 0   No current facility-administered medications for this visit.    LABS: Lab Results  Component Value Date   WBC 6.0 01/08/2015   HGB 11.7* 01/08/2015   HCT 35.7* 01/08/2015   MCV  87.1 01/08/2015   PLT 229 01/08/2015      Component Value Date/Time   NA 145 01/08/2015 1723   NA 142 09/05/2013 0915   K 4.7 01/08/2015 1723   CL 111 01/08/2015 1723   CO2 25 01/08/2015 1723   GLUCOSE 95 01/08/2015 1723   GLUCOSE 89 09/05/2013 0915   BUN 30* 01/08/2015 1723   BUN 14 09/05/2013 0915   CREATININE 1.07 01/08/2015 1723   CALCIUM 9.4 01/08/2015 1723   GFRNONAA 42* 01/08/2015 1723   GFRAA 49* 01/08/2015 1723   Lab Results  Component Value Date   INR 1.4 05/03/2009   INR 1.3 05/02/2009   INR 1.1 05/01/2009   No results found for: PTT   DENTAL HISTORY: CHIEF COMPLAINT: Patient seen for periodic dental examination and cleaning.  HPI: [name] is a 81 year old that presents for periodical examination and dental cleaning.  The patient has multiple medical problems and currently resides at [facility].  The patient does have a previous right hip replacement and Dr. Jerl Santos was previously contacted and indicated that antibiotic premedication prior to invasive dental procedures was NOT indicated. Patient had been seen in July and  August 2016 for leg wounds and was treated with doxycycline antibiotic therapy. The patient currently denies acute toothache, swellings, or abscesses. Patient is complaining of some sensitivity of the upper right premolar. Patient again denies toothache-type symptoms.  Patient was last seen by Dental Medicine for an exam and cleaning in March 2016.   DENTAL EXAMINATION: GENERAL: The patient is a well-developed, slightly built female in no acute distress. The patient walks with the aid of a walker.  HEAD AND NECK: There is no palpable lymphadenopathy. The patient denies acute TMJ symptoms but has bilateral TMJ crepitus. INTRAORAL EXAM: The patient has incipient xerostomia. There is no evidence of oral abscess formation. DENTITION: The patient is missing tooth numbers 1, 2, 3, 4, 14, 15, 16, 17, 18, 19, 20, 21, 29, 30, 31, and 32. Multiple rotated teeth are noted. PERIODONTAL:  The patient has chronic periodontitis with plaque and calculus accumulations, generalized gingival recession, and incipient tooth mobility. Tooth numbers 23 and 24 are splinted together by resin. Mandibular anterior calculus on the lingual aspect was noted and oral hygiene improvement was highly suggested. DENTAL CARIES/SUBOPTIMAL RESTORATIONS: Multiple flexure lesions are noted. ENDODONTIC: The patient currently denies acute pulpitis symptoms. Periapical radiograph of tooth #5 does not reveal periapical pathology. CROWN AND BRIDGE: There are no crown or bridge restorations. PROSTHODONTIC: Patient currently has no partial dentures that she is wearing. OCCLUSION: Patient has a poor occlusal scheme secondary to multiple missing teeth, multiple rotated teeth, supra-eruption and drifting of the unopposed teeth into the edentulous areas, and lack of replacement of all missing teeth with dental prostheses. Traumatic occlusion may be responsible for some increased sensitivity of the upper right quadrant. Radiographic Interpretation: PA  radiograph tooth #5 does not reveal periapical pathology. Bone loss was noted. No obvious dental caries noted previous dental restoration was noted. Rotated tooth was noted.  ASSESSMENTS: 1. Chronic periodontitis with bone loss 2. Gingival recession 3. Incipient tooth mobility 4. Accretions 5. Multiple flexure lesions 6. Multiple missing teeth 7. Supra-eruption and drifting of the unopposed teeth into the edentulous areas 8  Multiple rotated teeth 9. No current upper or lower partial dentures 10. Poor occlusal scheme but stable malocclusion.  11. Occlusal trauma may be contributing to increased to sensitivity involving the upper right quadrant. 12. Status post total hip replacement  with NO need for antibiotic premedication prior to invasive dental procedures per orthopedic surgeon-Dr. Marcene Corning  Procedure: Periapical radiograph of tooth #5 Adult prophylaxis with Cavitron and multiple curettes as needed. Teeth polish. Fluoride varnish was applied. Oral hygiene instructions on brushing after meals and at bedtime and flossing at bedtime was provided today with emphasis on mandibular anterior lingual surfaces. Patient tolerated the procedure well. Patient was dismissed to the caregiver in stable condition. Patient is to return to clinic for periodontal maintenance in 6 months. Call if problems arise before then.   Charlynne Pander, DDS

## 2015-07-04 ENCOUNTER — Telehealth: Payer: Self-pay | Admitting: *Deleted

## 2015-07-04 ENCOUNTER — Other Ambulatory Visit: Payer: Self-pay

## 2015-07-04 MED ORDER — CLONAZEPAM 1 MG PO TABS: ORAL_TABLET | ORAL | Status: DC

## 2015-07-04 NOTE — Telephone Encounter (Signed)
Received Fax from [facility]

## 2015-07-04 NOTE — Telephone Encounter (Signed)
Received FL2 Form from [facility] at [place] [phone]. Filled out FL2 and given to Dr. Renato Gails to review and sign.

## 2015-07-04 NOTE — Telephone Encounter (Signed)
FL2 Completed and signed. Carmella Notified to pick up. Copy made and sent for scan.

## 2015-07-11 ENCOUNTER — Ambulatory Visit: Payer: Medicare Other | Admitting: Internal Medicine

## 2015-07-24 ENCOUNTER — Telehealth: Payer: Self-pay

## 2015-07-24 MED ORDER — RANITIDINE HCL 150 MG PO TABS: 150.0000 mg | ORAL_TABLET | Freq: Every day | ORAL | Status: DC | PRN

## 2015-07-24 NOTE — Telephone Encounter (Signed)
RX was printed, stamped with Dr.Reed's signature and [facility]

## 2015-07-24 NOTE — Telephone Encounter (Signed)
[facility] rx for Ranitidine HcL 150 mg , note states patient has not had medeication for a couple of days.  This medication is not on current list, please advise if RX to be provided for the above, if yes how many allowed refills. RX will be printed and [facility]

## 2015-07-24 NOTE — Telephone Encounter (Signed)
Ok to give ranitidine 150mg  po daily as needed for reflux #30, 3 refills.

## 2015-07-30 ENCOUNTER — Ambulatory Visit (INDEPENDENT_AMBULATORY_CARE_PROVIDER_SITE_OTHER): Payer: Medicare Other | Admitting: Internal Medicine

## 2015-07-30 ENCOUNTER — Encounter: Payer: Self-pay | Admitting: Internal Medicine

## 2015-07-30 VITALS — BP 160/66 | HR 43 | Temp 98.0°F | Resp 20 | Ht <= 58 in | Wt 100.6 lb

## 2015-07-30 DIAGNOSIS — S90821A Blister (nonthermal), right foot, initial encounter: Secondary | ICD-10-CM | POA: Diagnosis not present

## 2015-07-30 DIAGNOSIS — L089 Local infection of the skin and subcutaneous tissue, unspecified: Secondary | ICD-10-CM | POA: Diagnosis not present

## 2015-07-30 DIAGNOSIS — Z23 Encounter for immunization: Secondary | ICD-10-CM | POA: Diagnosis not present

## 2015-07-30 DIAGNOSIS — S90829A Blister (nonthermal), unspecified foot, initial encounter: Secondary | ICD-10-CM

## 2015-07-30 DIAGNOSIS — R609 Edema, unspecified: Secondary | ICD-10-CM | POA: Insufficient documentation

## 2015-07-30 MED ORDER — ZOSTER VACCINE LIVE 19400 UNT/0.65ML ~~LOC~~ SOLR: 0.6500 mL | Freq: Once | SUBCUTANEOUS | Status: DC

## 2015-07-30 MED ORDER — TETANUS-DIPHTH-ACELL PERTUSSIS 5-2.5-18.5 LF-MCG/0.5 IM SUSP: 0.5000 mL | Freq: Once | INTRAMUSCULAR | Status: DC

## 2015-07-30 MED ORDER — DOXYCYCLINE HYCLATE 100 MG PO CAPS: ORAL_CAPSULE | ORAL | Status: DC

## 2015-07-30 NOTE — Progress Notes (Signed)
Patient ID: [name], female   DOB: 01-Aug-1916, 81 y.o.   MRN: [id number]    Facility  [facility]    Place of Service:   OFFICE    Allergies  Allergen Reactions  . Sulfa Antibiotics Shortness Of Breath    Worsens asthma  . Clonidine Derivatives   . Lactose Intolerance (Gi)   . Verapamil     NH MAR    Chief Complaint  Patient presents with  . Medical Management of Chronic Issues    follow-up for her righ foot  . Immunizations    Will take flu shot today    HPI:  A large blister formed on the dorsum of the right foot distally about 8 or 9 days ago. This was caused according to patient's daughter by improper application of TEDs hose. There was some binding of the distal foot and then the blister showed up 2 days later. Benadryl spray was ordered by on-call physician and has been useless. Because of an increasing erythema, daughter began worrying about a relapse of her cellulitis. In July and August she had a cellulitis of right lower extremity.  Medications: Patient's Medications  New Prescriptions   No medications on file  Previous Medications   ACETAMINOPHEN (TYLENOL) 325 MG TABLET    Take 650 mg by mouth every 6 (six) hours as needed for mild pain.    CAPSICUM OLEORESIN (TRIXAICIN) 0.025 % CREAM    Apply to right shoulder and upper arm three times daily   CHOLECALCIFEROL (VITAMIN D) 1000 UNITS TABLET    Take 1,000 Units by mouth daily.   CLONAZEPAM (KLONOPIN) 1 MG TABLET    Take one tablet by mouth at bedtime for anxiety   DOCUSATE SODIUM (COLACE) 100 MG CAPSULE    Take 100 mg by mouth 2 (two) times daily.   FUROSEMIDE (LASIX) 20 MG TABLET    Take 1 tablet (20 mg total) by mouth every other day.   HYDROCODONE-ACETAMINOPHEN (NORCO/VICODIN) 5-325 MG TABLET    Take one to two tablets by mouth twice daily for pain   LISINOPRIL (PRINIVIL,ZESTRIL) 40 MG TABLET    Take 40 mg by mouth daily.   MIRTAZAPINE (REMERON) 7.5 MG TABLET    Take 1 tablet (7.5 mg total) by mouth at bedtime.     POLYETHYL GLYCOL-PROPYL GLYCOL (SYSTANE) 0.4-0.3 % SOLN    Instill one drop into each eye two times daily to relieve dry,itchy eye.   POTASSIUM CHLORIDE SA (K-DUR,KLOR-CON) 20 MEQ TABLET    Take 1 tablet (20 mEq total) by mouth every other day. For chf   RANITIDINE (ZANTAC) 150 MG TABLET    Take 1 tablet (150 mg total) by mouth daily as needed for heartburn.   SENNA-DOCUSATE (SENOKOT-S) 8.6-50 MG PER TABLET    Take 1 tablet by mouth daily.   TDAP (BOOSTRIX) 5-2.5-18.5 LF-MCG/0.5 INJECTION    Inject 0.5 mLs into the muscle once.   ZOSTER VACCINE LIVE, PF, (ZOSTAVAX) 35456 UNT/0.65ML INJECTION    Inject 19,400 Units into the skin once.  Modified Medications   No medications on file  Discontinued Medications   DOXYCYCLINE (VIBRA-TABS) 100 MG TABLET    Take 1 tablet (100 mg total) by mouth 2 (two) times daily.   SACCHAROMYCES BOULARDII (FLORASTOR) 250 MG CAPSULE    Take 1 capsule (250 mg total) by mouth 2 (two) times daily.    Review of Systems  Constitutional: Negative for fever and chills.  HENT: Negative for congestion and hearing loss.  Eyes:       Corrective lenses  Respiratory: Negative for cough and shortness of breath.   Cardiovascular: Positive for leg swelling. Negative for chest pain.  Gastrointestinal: Positive for constipation. Negative for abdominal pain.       Two stool softeners effective  Genitourinary: Negative for dysuria.  Musculoskeletal: Positive for back pain and gait problem (Unstable. Using walker.).  Skin: Positive for rash.       Blister right foot dorsum  Neurological: Positive for weakness. Negative for dizziness.       Alzheimer's  Psychiatric/Behavioral: The patient is not nervous/anxious.     Filed Vitals:   07/30/15 1612  BP: 160/66  Pulse: 43  Temp: 98 F (36.7 C)  TempSrc: Oral  Resp: 20  Height: '4\' 8"'  (1.422 m)  Weight: 100 lb 9.6 oz (45.632 kg)  SpO2: 95%   Body mass index is 22.57 kg/(m^2).  Physical Exam  Constitutional:  Frail  white female walks with walker with skis  Cardiovascular: Intact distal pulses.   Murmur heard. Pulmonary/Chest: Effort normal and breath sounds normal. She has no wheezes.  Abdominal: Soft. Bowel sounds are normal. She exhibits no distension. There is no tenderness.  Musculoskeletal:  Walks with rolling walker with skis  Neurological: She is alert.  Oriented to person and place not time  Skin:  Ruptured blister dorsum of the right foot. Tender to touch. Slight erythema at the blister site which is covered with a blister scan at this time. There is also mild warmth and erythema halfway up the right leg.  Psychiatric: She has a normal mood and affect.    Labs reviewed: Lab Summary Latest Ref Rng 01/08/2015 11/04/2014 11/03/2014  Hemoglobin 12.0 - 15.0 g/dL 11.7(L) 12.9 12.1  Hematocrit 36.0 - 46.0 % 35.7(L) 39.2 36.5  White count 4.0 - 10.5 K/uL 6.0 9.0 8.3  Platelet count 150 - 400 K/uL 229 233 222  Sodium 135 - 145 mmol/L 145 140 141  Potassium 3.5 - 5.1 mmol/L 4.7 4.2 4.4  Calcium 8.4 - 10.5 mg/dL 9.4 9.1 9.2  Phosphorus 2.3 - 4.6 mg/dL 4.2 (None) (None)  Creatinine 0.50 - 1.10 mg/dL 1.07 0.97 1.00  AST 0 - 37 U/L '18 21 22  ' Alk Phos 39 - 117 U/L 73 70 74  Bilirubin 0.3 - 1.2 mg/dL 0.5 1.0 0.5  Glucose 70 - 99 mg/dL 95 97 89  Cholesterol - (None) (None) (None)  HDL cholesterol - (None) (None) (None)  Triglycerides - (None) (None) (None)  LDL Direct - (None) (None) (None)  LDL Calc - (None) (None) (None)  Total protein 6.0 - 8.3 g/dL 6.3 5.9(L) 6.6  Albumin 3.5 - 5.2 g/dL 3.7 3.5 3.8   Lab Results  Component Value Date   TSH 1.378 01/08/2015   Lab Results  Component Value Date   BUN 30* 01/08/2015   No results found for: HGBA1C  Assessment/Plan  1. Blister of foot with infection, right, initial encounter her is labeled and everything so - doxycycline (VIBRAMYCIN) 100 MG capsule; One twice daily to treat infection  Dispense: 20 capsule; Refill: 0 - Aerobic Culture

## 2015-07-30 NOTE — Patient Instructions (Addendum)
Elevate legs when possible. Start doxycycline twice daily. Discontinue triple antibiotic and substitute Polysporin ointment.

## 2015-08-03 LAB — AEROBIC CULTURE

## 2015-08-05 ENCOUNTER — Encounter: Payer: Self-pay | Admitting: Internal Medicine

## 2015-08-05 ENCOUNTER — Ambulatory Visit (INDEPENDENT_AMBULATORY_CARE_PROVIDER_SITE_OTHER): Payer: Medicare Other | Admitting: Internal Medicine

## 2015-08-05 VITALS — BP 150/80 | HR 47 | Temp 97.5°F | Resp 20 | Ht <= 58 in | Wt 99.6 lb

## 2015-08-05 DIAGNOSIS — I83019 Varicose veins of right lower extremity with ulcer of unspecified site: Secondary | ICD-10-CM

## 2015-08-05 DIAGNOSIS — L03115 Cellulitis of right lower limb: Secondary | ICD-10-CM | POA: Diagnosis not present

## 2015-08-05 DIAGNOSIS — J449 Chronic obstructive pulmonary disease, unspecified: Secondary | ICD-10-CM

## 2015-08-05 DIAGNOSIS — I83029 Varicose veins of left lower extremity with ulcer of unspecified site: Secondary | ICD-10-CM

## 2015-08-05 DIAGNOSIS — S90821A Blister (nonthermal), right foot, initial encounter: Secondary | ICD-10-CM | POA: Diagnosis not present

## 2015-08-05 DIAGNOSIS — L97929 Non-pressure chronic ulcer of unspecified part of left lower leg with unspecified severity: Secondary | ICD-10-CM

## 2015-08-05 DIAGNOSIS — K5909 Other constipation: Secondary | ICD-10-CM

## 2015-08-05 DIAGNOSIS — K5900 Constipation, unspecified: Secondary | ICD-10-CM | POA: Diagnosis not present

## 2015-08-05 DIAGNOSIS — L089 Local infection of the skin and subcutaneous tissue, unspecified: Secondary | ICD-10-CM

## 2015-08-05 DIAGNOSIS — I872 Venous insufficiency (chronic) (peripheral): Secondary | ICD-10-CM

## 2015-08-05 DIAGNOSIS — L97919 Non-pressure chronic ulcer of unspecified part of right lower leg with unspecified severity: Secondary | ICD-10-CM

## 2015-08-05 DIAGNOSIS — I509 Heart failure, unspecified: Secondary | ICD-10-CM

## 2015-08-05 NOTE — Progress Notes (Signed)
Patient ID: [name], female   DOB: 1916/05/08, 81 y.o.   MRN: [id number]   Location: [facility] Provider: Gwenith Spitz. Renato Gails, D.O., C.M.D.  Code Status: DNR Goals of Care: Advanced Directive information Does patient have an advance directive?: Yes  Chief Complaint  Patient presents with  . Medical Management of Chronic Issues    HPI: Patient is a 81 y.o. female seen in the office today for medical mgt of chronic diseases.    Uses walker to go up and down hall to dining room.  Also walks with daughter when gets her hair done and when leaves premise to her daughter's house.  She was seen acutely by Dr. Chilton Si last week when she developed a blister on her right foot that included the dorsum and her third toe.  It had occurred from a mixture of a wrinkle in her compression hose and a shoe rubbing on top of it.  Dr. Chilton Si prescribed doxycycline for a 10 day course and a dressing with coban wrap and compression hose.  She has had a great improvement.    Review of Systems:  Review of Systems  Constitutional: Negative for fever, chills and malaise/fatigue.  Eyes: Positive for blurred vision.  Respiratory: Negative for shortness of breath.   Cardiovascular: Negative for chest pain.  Gastrointestinal: Negative for abdominal pain.  Genitourinary: Negative for dysuria.  Musculoskeletal: Negative for falls.       Says she slipped slightly on the toilet seat that was wet this am  Skin:       Right foot blister  Neurological: Negative for dizziness, loss of consciousness, weakness and headaches.  Psychiatric/Behavioral: Positive for depression and memory loss.       Well controlled and mmse 28/30    Past Medical History  Diagnosis Date  . COPD (chronic obstructive pulmonary disease) (HCC)   . Hypertension   . Oral aphthae   . Orthostatic hypotension   . Senile osteoporosis   . Atrophy of vulva   . Unspecified urinary incontinence   . Urinary frequency   . Unspecified  vitamin D deficiency   . Anxiety state, unspecified   . Restless legs syndrome (RLS)   . Unspecified disorder of kidney and ureter   . Atrioventricular block, unspecified   . Closed dislocation of shoulder, unspecified site   . Anemia, unspecified   . Altered mental status   . Lumbago   . Transient disorder of initiating or maintaining sleep   . Cellulitis and abscess of unspecified site   . Sebaceous cyst   . Insomnia, unspecified   . Dementia in conditions classified elsewhere without behavioral disturbance   . Depressive disorder, not elsewhere classified   . Abnormality of gait   . Adult failure to thrive   . Headache(784.0)   . Gout, unspecified   . Extrinsic asthma, unspecified   . Chronic kidney disease, stage I   . Osteoarthrosis, unspecified whether generalized or localized, unspecified site   . Spinal stenosis, unspecified region other than cervical   . Edema   . Tachycardia, unspecified     Past Surgical History  Procedure Laterality Date  . Hip surgery      (R) hip replacement  . Cesarean section    . Abdominal hysterectomy      partial  . Back surgery      fusion  . Breast surgery      bilateral breast reduction  . Eye surgery      bilateral cataract  .  Basal cell carcinoma excision    . Basal cell carcinoma excision      face  . Hip closed reduction Right 08/07/2013    Procedure: ATTEMPTED CLOSED MANIPULATION HIP;  Surgeon: Jodi Marbleavid A Thompson, MD;  [facility];  Service: Orthopedics;  Laterality: Right;  . Total hip revision Right 08/08/2013    Procedure: TOTAL HIP REVISION- right;  Surgeon: Velna OchsPeter G Dalldorf, MD;  Location: [facility] OR;  Service: Orthopedics;  Laterality: Right;    Allergies  Allergen Reactions  . Sulfa Antibiotics Shortness Of Breath    Worsens asthma  . Clonidine Derivatives   . Lactose Intolerance (Gi)   . Verapamil     [facility] MAR      Medication List       This list is accurate as of: 08/05/15  4:06 PM.  Always use your most  recent med list.               acetaminophen 325 MG tablet  Commonly known as:  TYLENOL  Take 650 mg by mouth every 6 (six) hours as needed for mild pain.     capsicum oleoresin 0.025 % cream  Commonly known as:  TRIXAICIN  Apply to right shoulder and upper arm three times daily     cholecalciferol 1000 UNITS tablet  Commonly known as:  VITAMIN D  Take 1,000 Units by mouth daily.     docusate sodium 100 MG capsule  Commonly known as:  COLACE  Take 100 mg by mouth 2 (two) times daily.     doxycycline 100 MG capsule  Commonly known as:  VIBRAMYCIN  One twice daily to treat infection     furosemide 20 MG tablet  Commonly known as:  LASIX  Take 1 tablet (20 mg total) by mouth every other day.     HYDROcodone-acetaminophen 5-325 MG tablet  Commonly known as:  NORCO/VICODIN  Take one to two tablets by mouth twice daily for pain     lisinopril 40 MG tablet  Commonly known as:  PRINIVIL,ZESTRIL  Take 40 mg by mouth daily.     Polyethyl Glycol-Propyl Glycol 0.4-0.3 % Soln  Commonly known as:  SYSTANE  Instill one drop into each eye two times daily to relieve dry,itchy eye.     potassium chloride SA 20 MEQ tablet  Commonly known as:  K-DUR,KLOR-CON  Take 1 tablet (20 mEq total) by mouth every other day. For chf     ranitidine 150 MG tablet  Commonly known as:  ZANTAC  Take 1 tablet (150 mg total) by mouth daily as needed for heartburn.     senna-docusate 8.6-50 MG tablet  Commonly known as:  Senokot-S  Take 1 tablet by mouth daily.     Tdap 5-2.5-18.5 LF-MCG/0.5 injection  Commonly known as:  BOOSTRIX  Inject 0.5 mLs into the muscle once.     zoster vaccine live (PF) 19400 UNT/0.65ML injection  Commonly known as:  ZOSTAVAX  Inject 19,400 Units into the skin once.        Health Maintenance  Topic Date Due  . ZOSTAVAX  09/30/2015 (Originally 02/07/1978)  . TETANUS/TDAP  09/30/2015 (Originally 02/07/1937)  . DEXA SCAN  09/28/2048 (Originally 02/08/1983)  .  INFLUENZA VACCINE  04/28/2016  . PNA vac Low Risk Adult (2 of 2 - PPSV23) 05/22/2016    Physical Exam: Filed Vitals:   08/05/15 1435  BP: 150/80  Pulse: 47  Temp: 97.5 F (36.4 C)  TempSrc: Oral  Resp: 20  Height: 4'  8" (1.422 m)  Weight: 99 lb 9.6 oz (45.178 kg)  SpO2: 95%   Body mass index is 22.34 kg/(m^2). Physical Exam  Constitutional: She is oriented to person, place, and time. She appears well-developed and well-nourished. No distress.  Cardiovascular: Normal heart sounds.   brady  Pulmonary/Chest: Effort normal and breath sounds normal. No respiratory distress.  Abdominal: Soft. Bowel sounds are normal.  Musculoskeletal: Normal range of motion.  Stooped posture and uses walker with skis  Neurological: She is alert and oriented to person, place, and time.  Skin:  Right foot with outline of blister on dorsum of foot; posterior aspect is covered with skin and anterior aspect had skin removed; erythema much improved of right leg  Psychiatric: She has a normal mood and affect.    Labs reviewed: Basic Metabolic Panel:  Recent Labs  16/10/96 0842 11/04/14 0500 01/08/15 1723  NA 141 140 145  K 4.4 4.2 4.7  CL 107 107 111  CO2 GLUCOSE 89 97 95  BUN 26* 18 30*  CREATININE 1.00 0.97 1.07  CALCIUM 9.2 9.1 9.4  MG  --   --  2.0  PHOS  --   --  4.2  TSH  --   --  1.378   Liver Function Tests:  Recent Labs  11/03/14 0842 11/04/14 0500 01/08/15 1723  AST ALT ALKPHOS 74 70 73  BILITOT 0.5 1.0 0.5  PROT 6.6 5.9* 6.3  ALBUMIN 3.8 3.5 3.7   No results for input(s): LIPASE, AMYLASE in the last 8760 hours. No results for input(s): AMMONIA in the last 8760 hours. CBC:  Recent Labs  11/03/14 0842 11/04/14 0500 01/08/15 1723  WBC 8.3 9.0 6.0  NEUTROABS 5.6  --  3.0  HGB 12.1 12.9 11.7*  HCT 36.5 39.2 35.7*  MCV 86.3 86.7 87.1  PLT 222 233 229   Lipid Panel: No results for input(s): CHOL, HDL, LDLCALC, TRIG, CHOLHDL,  LDLDIRECT in the last 8760 hours. No results found for: HGBA1C   Assessment/Plan 1. Blister of foot with infection, right, initial encounter -improving gradually -cont current treatment  2. Cellulitis of right lower extremity -resolved, still finishing doxycycline  3. Venous stasis ulcers of both lower extremities -resolved; now has blister of foot as above, cont compression hose use  4. Chronic constipation -stable lately without complications, cont colace, senna regimen  5. Congestive heart failure, unspecified congestive heart failure chronicity, unspecified congestive heart failure type (HCC) -no recent exacerbations, continues on lisinopril and lasix prn (not needed lately)  6. Chronic obstructive pulmonary disease, unspecified COPD, unspecified chronic bronchitis type -no current treatments in place, asymptomatic  Labs/tests ordered:  No new Next appt:  3 mos for med mgt  Jozlin Bently L. Amila Callies, D.O. Geriatrics [facility] [address], [place] [address] Cell Phone (Mon-Fri 8am-5pm):  [phone] On Call:  [phone] & follow prompts after 5pm & weekends Office Phone:  [phone] Office Fax:  [phone]

## 2015-08-05 NOTE — Progress Notes (Signed)
Refused to do the clock test.

## 2015-08-29 DIAGNOSIS — I519 Heart disease, unspecified: Secondary | ICD-10-CM | POA: Diagnosis not present

## 2015-08-29 DIAGNOSIS — I739 Peripheral vascular disease, unspecified: Secondary | ICD-10-CM | POA: Diagnosis not present

## 2015-08-29 DIAGNOSIS — F339 Major depressive disorder, recurrent, unspecified: Secondary | ICD-10-CM | POA: Diagnosis not present

## 2015-08-29 DIAGNOSIS — J449 Chronic obstructive pulmonary disease, unspecified: Secondary | ICD-10-CM | POA: Diagnosis not present

## 2015-08-29 DIAGNOSIS — I10 Essential (primary) hypertension: Secondary | ICD-10-CM | POA: Diagnosis not present

## 2015-08-29 DIAGNOSIS — G309 Alzheimer's disease, unspecified: Secondary | ICD-10-CM | POA: Diagnosis not present

## 2015-08-29 DIAGNOSIS — R634 Abnormal weight loss: Secondary | ICD-10-CM | POA: Diagnosis not present

## 2015-08-29 DIAGNOSIS — R630 Anorexia: Secondary | ICD-10-CM | POA: Diagnosis not present

## 2015-08-29 DIAGNOSIS — I509 Heart failure, unspecified: Secondary | ICD-10-CM | POA: Diagnosis not present

## 2015-08-29 DIAGNOSIS — M109 Gout, unspecified: Secondary | ICD-10-CM | POA: Diagnosis not present

## 2015-08-29 DIAGNOSIS — I495 Sick sinus syndrome: Secondary | ICD-10-CM | POA: Diagnosis not present

## 2015-08-29 DIAGNOSIS — H11149 Conjunctival xerosis, unspecified, unspecified eye: Secondary | ICD-10-CM | POA: Diagnosis not present

## 2015-08-29 DIAGNOSIS — E559 Vitamin D deficiency, unspecified: Secondary | ICD-10-CM | POA: Diagnosis not present

## 2015-09-11 ENCOUNTER — Telehealth: Payer: Self-pay | Admitting: *Deleted

## 2015-09-11 NOTE — Telephone Encounter (Signed)
The nurse called to report that Ms [name] is having pain in her lower leg when she stands. Wants an order for a doppler study to rule out blood clot. Please Advise!

## 2015-09-11 NOTE — Telephone Encounter (Signed)
Is this pain any different than usual?  Which nurse called?  Her legs always hurt her.  Which leg is it?  If it is new, they can get a portable venous doppler to r/o DVT.

## 2015-09-12 ENCOUNTER — Telehealth: Payer: Self-pay | Admitting: *Deleted

## 2015-09-12 NOTE — Telephone Encounter (Signed)
Spoke with [name] StanleyLisa Haley(RN) she stated that her legs hurt and she is unable to walk. I informed her that Dr. Hyacinth Meekereed okayed the portable venous doppler study to r/o DVT. She stated that she will order it for the patient, asap.

## 2015-09-13 DIAGNOSIS — R609 Edema, unspecified: Secondary | ICD-10-CM | POA: Diagnosis not present

## 2015-09-13 DIAGNOSIS — R52 Pain, unspecified: Secondary | ICD-10-CM | POA: Diagnosis not present

## 2015-09-16 DIAGNOSIS — M25539 Pain in unspecified wrist: Secondary | ICD-10-CM | POA: Diagnosis not present

## 2015-09-16 DIAGNOSIS — M25529 Pain in unspecified elbow: Secondary | ICD-10-CM | POA: Diagnosis not present

## 2015-09-20 ENCOUNTER — Encounter (HOSPITAL_COMMUNITY): Payer: Self-pay | Admitting: Emergency Medicine

## 2015-09-20 ENCOUNTER — Emergency Department (HOSPITAL_COMMUNITY)
Admission: EM | Admit: 2015-09-20 | Discharge: 2015-09-20 | Disposition: A | Discharge status: Home / Self Care | Attending: Emergency Medicine | Admitting: Emergency Medicine

## 2015-09-20 ENCOUNTER — Emergency Department (HOSPITAL_COMMUNITY)

## 2015-09-20 DIAGNOSIS — F419 Anxiety disorder, unspecified: Secondary | ICD-10-CM | POA: Diagnosis not present

## 2015-09-20 DIAGNOSIS — F0390 Unspecified dementia without behavioral disturbance: Secondary | ICD-10-CM | POA: Insufficient documentation

## 2015-09-20 DIAGNOSIS — Y9389 Activity, other specified: Secondary | ICD-10-CM | POA: Diagnosis not present

## 2015-09-20 DIAGNOSIS — S40011A Contusion of right shoulder, initial encounter: Secondary | ICD-10-CM | POA: Diagnosis not present

## 2015-09-20 DIAGNOSIS — M1990 Unspecified osteoarthritis, unspecified site: Secondary | ICD-10-CM | POA: Diagnosis not present

## 2015-09-20 DIAGNOSIS — G4700 Insomnia, unspecified: Secondary | ICD-10-CM | POA: Insufficient documentation

## 2015-09-20 DIAGNOSIS — M810 Age-related osteoporosis without current pathological fracture: Secondary | ICD-10-CM | POA: Insufficient documentation

## 2015-09-20 DIAGNOSIS — Z23 Encounter for immunization: Secondary | ICD-10-CM | POA: Diagnosis not present

## 2015-09-20 DIAGNOSIS — S0511XA Contusion of eyeball and orbital tissues, right eye, initial encounter: Secondary | ICD-10-CM | POA: Insufficient documentation

## 2015-09-20 DIAGNOSIS — G2581 Restless legs syndrome: Secondary | ICD-10-CM | POA: Diagnosis not present

## 2015-09-20 DIAGNOSIS — N181 Chronic kidney disease, stage 1: Secondary | ICD-10-CM | POA: Insufficient documentation

## 2015-09-20 DIAGNOSIS — I129 Hypertensive chronic kidney disease with stage 1 through stage 4 chronic kidney disease, or unspecified chronic kidney disease: Secondary | ICD-10-CM | POA: Insufficient documentation

## 2015-09-20 DIAGNOSIS — Z872 Personal history of diseases of the skin and subcutaneous tissue: Secondary | ICD-10-CM | POA: Diagnosis not present

## 2015-09-20 DIAGNOSIS — I10 Essential (primary) hypertension: Secondary | ICD-10-CM

## 2015-09-20 DIAGNOSIS — I499 Cardiac arrhythmia, unspecified: Secondary | ICD-10-CM | POA: Diagnosis not present

## 2015-09-20 DIAGNOSIS — E559 Vitamin D deficiency, unspecified: Secondary | ICD-10-CM | POA: Insufficient documentation

## 2015-09-20 DIAGNOSIS — F329 Major depressive disorder, single episode, unspecified: Secondary | ICD-10-CM | POA: Insufficient documentation

## 2015-09-20 DIAGNOSIS — Z8742 Personal history of other diseases of the female genital tract: Secondary | ICD-10-CM | POA: Diagnosis not present

## 2015-09-20 DIAGNOSIS — J449 Chronic obstructive pulmonary disease, unspecified: Secondary | ICD-10-CM | POA: Insufficient documentation

## 2015-09-20 DIAGNOSIS — W01198A Fall on same level from slipping, tripping and stumbling with subsequent striking against other object, initial encounter: Secondary | ICD-10-CM | POA: Insufficient documentation

## 2015-09-20 DIAGNOSIS — S0081XA Abrasion of other part of head, initial encounter: Secondary | ICD-10-CM | POA: Diagnosis not present

## 2015-09-20 DIAGNOSIS — Z862 Personal history of diseases of the blood and blood-forming organs and certain disorders involving the immune mechanism: Secondary | ICD-10-CM | POA: Diagnosis not present

## 2015-09-20 DIAGNOSIS — Y998 Other external cause status: Secondary | ICD-10-CM | POA: Insufficient documentation

## 2015-09-20 DIAGNOSIS — S0990XA Unspecified injury of head, initial encounter: Secondary | ICD-10-CM | POA: Diagnosis present

## 2015-09-20 DIAGNOSIS — Z79899 Other long term (current) drug therapy: Secondary | ICD-10-CM | POA: Insufficient documentation

## 2015-09-20 DIAGNOSIS — S0180XA Unspecified open wound of other part of head, initial encounter: Secondary | ICD-10-CM | POA: Diagnosis not present

## 2015-09-20 DIAGNOSIS — W19XXXA Unspecified fall, initial encounter: Secondary | ICD-10-CM

## 2015-09-20 DIAGNOSIS — Y9289 Other specified places as the place of occurrence of the external cause: Secondary | ICD-10-CM | POA: Diagnosis not present

## 2015-09-20 DIAGNOSIS — T148 Other injury of unspecified body region: Secondary | ICD-10-CM | POA: Diagnosis not present

## 2015-09-20 DIAGNOSIS — R001 Bradycardia, unspecified: Secondary | ICD-10-CM | POA: Diagnosis not present

## 2015-09-20 LAB — CBC WITH DIFFERENTIAL/PLATELET
BASOS ABS: 0 10*3/uL (ref 0.0–0.1)
BASOS PCT: 1 %
EOS ABS: 0.2 10*3/uL (ref 0.0–0.7)
Eosinophils Relative: 3 %
HEMATOCRIT: 38.3 % (ref 36.0–46.0)
Hemoglobin: 12.2 g/dL (ref 12.0–15.0)
Lymphocytes Relative: 22 %
Lymphs Abs: 1.4 10*3/uL (ref 0.7–4.0)
MCH: 27.9 pg (ref 26.0–34.0)
MCHC: 31.9 g/dL (ref 30.0–36.0)
MCV: 87.4 fL (ref 78.0–100.0)
MONO ABS: 0.5 10*3/uL (ref 0.1–1.0)
Monocytes Relative: 9 %
NEUTROS ABS: 4.2 10*3/uL (ref 1.7–7.7)
Neutrophils Relative %: 67 %
PLATELETS: 198 10*3/uL (ref 150–400)
RBC: 4.38 MIL/uL (ref 3.87–5.11)
RDW: 13.9 % (ref 11.5–15.5)
WBC: 6.3 10*3/uL (ref 4.0–10.5)

## 2015-09-20 LAB — COMPREHENSIVE METABOLIC PANEL
ALBUMIN: 4.1 g/dL (ref 3.5–5.0)
ALT: 18 U/L (ref 14–54)
AST: 22 U/L (ref 15–41)
Alkaline Phosphatase: 70 U/L (ref 38–126)
Anion gap: 8 (ref 5–15)
BILIRUBIN TOTAL: 0.7 mg/dL (ref 0.3–1.2)
BUN: 33 mg/dL — AB (ref 6–20)
CHLORIDE: 109 mmol/L (ref 101–111)
CO2: 27 mmol/L (ref 22–32)
Calcium: 9.7 mg/dL (ref 8.9–10.3)
Creatinine, Ser: 1.03 mg/dL — ABNORMAL HIGH (ref 0.44–1.00)
GFR calc Af Amer: 51 mL/min — ABNORMAL LOW (ref >60)
GFR calc non Af Amer: 44 mL/min — ABNORMAL LOW (ref >60)
GLUCOSE: 95 mg/dL (ref 65–99)
POTASSIUM: 4.3 mmol/L (ref 3.5–5.1)
SODIUM: 144 mmol/L (ref 135–145)
Total Protein: 6.9 g/dL (ref 6.5–8.1)

## 2015-09-20 LAB — I-STAT TROPONIN, ED: Troponin i, poc: 0.04 ng/mL (ref 0.00–0.08)

## 2015-09-20 MED ORDER — TETANUS-DIPHTH-ACELL PERTUSSIS 5-2.5-18.5 LF-MCG/0.5 IM SUSP
0.5000 mL | Freq: Once | INTRAMUSCULAR | Status: AC
  Administered 2015-09-20: 0.5 mL via INTRAMUSCULAR
  Filled 2015-09-20: qty 0.5

## 2015-09-20 NOTE — ED Provider Notes (Signed)
CSN: [id number]     Arrival date & time 09/20/15  0906 History   First MD Initiated Contact with Patient 09/20/15 0911     Chief Complaint  Patient presents with  . Fall  . Head Laceration     (Consider location/radiation/quality/duration/timing/severity/associated sxs/prior Treatment) HPI  [name] is a 81 y.o. female  with a hx of COPD, hypertension, orthostatic hypertension, second-degree AV block presents to the Emergency Department complaining of acute onset reported mechanical fall with associated injury to the right side of the head. Per EMS patient has been hypertensive but alert and oriented with them.  No aggravating or relieving factors. Patient denies loss of consciousness, headache, vision changes, numbness, tingling, weakness. She specifically denies episode of dizziness or syncope today. Patient reports she has taken her morning medications including her Lasix and MAR confirms this.   Past Medical History  Diagnosis Date  . COPD (chronic obstructive pulmonary disease) (HCC)   . Hypertension   . Oral aphthae   . Orthostatic hypotension   . Senile osteoporosis   . Atrophy of vulva   . Unspecified urinary incontinence   . Urinary frequency   . Unspecified vitamin D deficiency   . Anxiety state, unspecified   . Restless legs syndrome (RLS)   . Unspecified disorder of kidney and ureter   . Atrioventricular block, unspecified   . Closed dislocation of shoulder, unspecified site   . Anemia, unspecified   . Altered mental status   . Lumbago   . Transient disorder of initiating or maintaining sleep   . Cellulitis and abscess of unspecified site   . Sebaceous cyst   . Insomnia, unspecified   . Dementia in conditions classified elsewhere without behavioral disturbance   . Depressive disorder, not elsewhere classified   . Abnormality of gait   . Adult failure to thrive   . Headache(784.0)   . Gout, unspecified   . Extrinsic asthma, unspecified   . Chronic  kidney disease, stage I   . Osteoarthrosis, unspecified whether generalized or localized, unspecified site   . Spinal stenosis, unspecified region other than cervical   . Edema   . Tachycardia, unspecified    Past Surgical History  Procedure Laterality Date  . Hip surgery      (R) hip replacement  . Cesarean section    . Abdominal hysterectomy      partial  . Back surgery      fusion  . Breast surgery      bilateral breast reduction  . Eye surgery      bilateral cataract  . Basal cell carcinoma excision    . Basal cell carcinoma excision      face  . Hip closed reduction Right 08/07/2013    Procedure: ATTEMPTED CLOSED MANIPULATION HIP;  Surgeon: Jodi Marbleavid A Thompson, MD;  [facility];  Service: Orthopedics;  Laterality: Right;  . Total hip revision Right 08/08/2013    Procedure: TOTAL HIP REVISION- right;  Surgeon: Velna OchsPeter G Dalldorf, MD;  Location: [facility] OR;  Service: Orthopedics;  Laterality: Right;   Family History  Problem Relation Age of Onset  . Diabetes Mother   . Diabetes Father   . Stroke Sister   . Heart attack Brother    Social History  Substance Use Topics  . Smoking status: Never Smoker   . Smokeless tobacco: Never Used  . Alcohol Use: No   OB History    No data available     Review of Systems  Constitutional: Negative for fever, diaphoresis, appetite change, fatigue and unexpected weight change.  HENT: Negative for mouth sores.   Eyes: Negative for visual disturbance.  Respiratory: Negative for cough, chest tightness, shortness of breath and wheezing.   Cardiovascular: Negative for chest pain.  Gastrointestinal: Negative for nausea, vomiting, abdominal pain, diarrhea and constipation.  Endocrine: Negative for polydipsia, polyphagia and polyuria.  Genitourinary: Negative for dysuria, urgency, frequency and hematuria.  Musculoskeletal: Negative for back pain and neck stiffness.  Skin: Positive for wound. Negative for rash.  Allergic/Immunologic: Negative  for immunocompromised state.  Neurological: Negative for syncope, light-headedness and headaches.  Hematological: Does not bruise/bleed easily.  Psychiatric/Behavioral: Negative for sleep disturbance. The patient is not nervous/anxious.       Allergies  Sulfa antibiotics; Clonidine derivatives; Lactose intolerance (gi); and Verapamil  Home Medications   Prior to Admission medications   Medication Sig Start Date End Date Taking? Authorizing Provider  acetaminophen (TYLENOL) 325 MG tablet Take 650 mg by mouth every 6 (six) hours as needed for mild pain or moderate pain.    Yes Historical Provider, MD  alum & mag hydroxide-simeth (MYLANTA DOUBLE-STRENGTH) 400-400-40 MG/5ML suspension Take 30 mLs by mouth every 6 (six) hours as needed for indigestion (heartburn and upset stomach).   Yes Historical Provider, MD  capsicum oleoresin (TRIXAICIN) 0.025 % cream Apply to right shoulder and upper arm three times daily Patient taking differently: Apply 1 application topically 3 (three) times daily. Apply to right shoulder/upper arm 07/30/14  Yes Tiffany L Reed, DO  cholecalciferol (VITAMIN D) 1000 UNITS tablet Take 1,000 Units by mouth daily.   Yes Historical Provider, MD  clonazePAM (KLONOPIN) 1 MG tablet Take 1 mg by mouth at bedtime.   Yes Historical Provider, MD  diphenhydrAMINE (BENADRYL READYMIST) spray Apply 2 sprays topically every 8 (eight) hours as needed (for burning). Apply to right foot blistered area for burning   Yes Historical Provider, MD  docusate sodium (COLACE) 100 MG capsule Take 100 mg by mouth 2 (two) times daily.   Yes Historical Provider, MD  furosemide (LASIX) 20 MG tablet Take 1 tablet (20 mg total) by mouth every other day. 03/05/15  Yes Sharon Seller, NP  HYDROcodone-acetaminophen (NORCO) 10-325 MG tablet Take 1 tablet by mouth 2 (two) times daily.   Yes Historical Provider, MD  HYDROcodone-acetaminophen (NORCO) 10-325 MG tablet Take 1 tablet by mouth every 4 (four) hours as  needed. For pain   Yes Historical Provider, MD  lisinopril (PRINIVIL,ZESTRIL) 40 MG tablet Take 40 mg by mouth daily.   Yes Historical Provider, MD  mirtazapine (REMERON) 7.5 MG tablet Take 7.5 mg by mouth at bedtime.   Yes Historical Provider, MD  Polyethyl Glycol-Propyl Glycol (SYSTANE) 0.4-0.3 % SOLN Instill one drop into each eye two times daily to relieve dry,itchy eye. Patient taking differently: Place 1 drop into both eyes 2 (two) times daily. to relieve itchy eyes 02/01/14  Yes Sharon Seller, NP  potassium chloride SA (K-DUR,KLOR-CON) 20 MEQ tablet Take 1 tablet (20 mEq total) by mouth every other day. For chf 12/17/14  Yes Sharon Seller, NP  ranitidine (ZANTAC) 150 MG tablet Take 1 tablet (150 mg total) by mouth daily as needed for heartburn. Patient taking differently: Take 150 mg by mouth every evening.  07/24/15  Yes Tiffany L Reed, DO  senna-docusate (SENOKOT-S) 8.6-50 MG per tablet Take 1 tablet by mouth daily.   Yes Historical Provider, MD  doxycycline (VIBRAMYCIN) 100 MG capsule One twice daily to treat  infection Patient not taking: Reported on 09/20/2015 07/30/15   Kimber Relic, MD  HYDROcodone-acetaminophen (NORCO/VICODIN) 5-325 MG tablet Take one to two tablets by mouth twice daily for pain Patient not taking: Reported on 09/20/2015 07/01/15   Tiffany L Reed, DO  Tdap (BOOSTRIX) 5-2.5-18.5 LF-MCG/0.5 injection Inject 0.5 mLs into the muscle once. Patient not taking: Reported on 08/05/2015 07/30/15   Kimber Relic, MD  zoster vaccine live, PF, (ZOSTAVAX) 16109 UNT/0.65ML injection Inject 19,400 Units into the skin once. Patient not taking: Reported on 08/05/2015 07/30/15   Kimber Relic, MD   BP 221/73 mmHg  Pulse 74  Temp(Src) 97.6 F (36.4 C) (Oral)  Resp 16  SpO2 98% Physical Exam  Constitutional: She is oriented to person, place, and time. She appears well-developed and well-nourished. No distress.  HENT:  Head: Normocephalic. Head is with abrasion.  Mouth/Throat:  Oropharynx is clear and moist.  Abrasion with small contusion noted over the right eye  Eyes: Conjunctivae and EOM are normal. Pupils are equal, round, and reactive to light. No scleral icterus.  No horizontal, vertical or rotational nystagmus Pupils constricted but reactive Full EOMs  Neck: Normal range of motion. Neck supple.  Full active and passive ROM without pain No midline or paraspinal tenderness No nuchal rigidity or meningeal signs  Cardiovascular: Normal heart sounds and intact distal pulses.  An irregular rhythm present. Bradycardia present.   Pulses:      Radial pulses are 2+ on the right side, and 2+ on the left side.       Dorsalis pedis pulses are 2+ on the right side, and 2+ on the left side.  Pulmonary/Chest: Effort normal and breath sounds normal. No respiratory distress. She has no wheezes. She has no rales.  Abdominal: Soft. Bowel sounds are normal. There is no tenderness. There is no rebound and no guarding.  Musculoskeletal: Normal range of motion.  Decreased range of motion of the right upper extremity at the shoulder; large area of ecchymosis to the anterior right shoulder Full range of motion of left upper extremity and bilateral lower extremities  Lymphadenopathy:    She has no cervical adenopathy.  Neurological: She is alert and oriented to person, place, and time. She has normal reflexes. No cranial nerve deficit. She exhibits normal muscle tone. Coordination normal.  Mental Status:  Alert, oriented to person, event and place, thought content appropriate. Speech fluent without evidence of aphasia. Able to follow 2 step commands without difficulty.  Cranial Nerves:  II:  pupils equal, round, reactive to light III,IV, VI: ptosis not present, extra-ocular motions intact bilaterally  V,VII: smile symmetric, facial light touch sensation equal VIII: hearing grossly normal bilaterally  IX,X: midline uvula rise  XI: bilateral shoulder shrug equal and strong XII:  midline tongue extension  Motor:  5/5 in upper and lower extremities bilaterally including strong and equal grip strength and dorsiflexion/plantar flexion Sensory: Pinprick and light touch normal in all extremities.  Deep Tendon Reflexes: 2+ and symmetric  Cerebellar: normal finger-to-nose with bilateral upper extremities Gait: Gait testing deferred  CV: distal pulses palpable throughout   Skin: Skin is warm and dry. No rash noted. She is not diaphoretic.  Psychiatric: She has a normal mood and affect. Her behavior is normal. Judgment and thought content normal.  Nursing note and vitals reviewed.   ED Course  Procedures (including critical care time) Labs Review Labs Reviewed  COMPREHENSIVE METABOLIC PANEL - Abnormal; Notable for the following:    BUN 33 (*)  Creatinine, Ser 1.03 (*)    GFR calc non Af Amer 44 (*)    GFR calc Af Amer 51 (*)    All other components within normal limits  CBC WITH DIFFERENTIAL/PLATELET  URINALYSIS, ROUTINE W REFLEX MICROSCOPIC (NOT AT [facility])  Rosezena Sensor, ED      EKG Interpretation   Date/Time:  Friday September 20 2015 10:01:27 EST Ventricular Rate:  42 PR Interval:    QRS Duration: 79 QT Interval:  520 QTC Calculation: 435 R Axis:   34 Text Interpretation:  Atrial fibrillation Left ventricular hypertrophy  Anterior Q waves, possibly due to LVH Abnrm T, consider ischemia,  anterolateral lds Prominent T waves compared to previous Confirmed by  NGUYEN, EMILY ([id number]) on 09/20/2015 10:20:51 AM      MDM   Final diagnoses:  Fall, initial encounter  Forehead abrasion, initial encounter  Essential hypertension  Bradycardia    Glenis Smoker Steward presents with reported mechanical fall when bending over to pick up a sweater off the floor. Patient reports she struck the right side of her head on a door frame. She denies headache or neck pain.  Small abrasion noted to the right forehead with bleeding controlled. No stitches needed at this  time. CT scan, EKG and his labs pending.  11:37 AM Patient's daughter is at bedside. She reports that bradycardia even into the 30s is baseline for patient. She also reports that they have allowed her blood pressure to be significant higher than normal and an effort to decrease the number of dizzy spells associated with her bradycardia. She reports that she has frequent falls. Patient's daughter reports that patient is DO NOT RESUSCITATE/DO NOT INTUBATE and under hospice care. She states that she agreed to transport the patient only because RN at facility suggested the patient would need stitches.  Patient's daughter is medical power of attorney and wishes for no further treatments to be initiated.  Patient has not been taken to CT. Her labs are reassuring. Discussed this with patient's daughter who request that we not obtain the CT. Risk and benefits discussed and patient's daughter is understanding and able to verbalize risk..  Pt remains alert and oriented to baseline. She is interactive and talkative. Patient's daughter reports she will be taking her mother home with her at bdischarge from the hospital.  The patient was discussed with and seen by Dr. Cyndie Chime who agrees with the treatment plan.   Dahlia Client Toluwani Ruder, PA-C 09/20/15 1150  Leta Baptist, MD 09/24/15 [phone]

## 2015-09-20 NOTE — ED Notes (Signed)
Daughter and POA Levan HurstRobin Suave states pt is a DNR; pt in room to verify this; POA states pt does not wish to have IV's inserted or CT head scans done. MD made aware.

## 2015-09-20 NOTE — ED Notes (Signed)
Two attempts to start IV unsuccessful; second attempt able to draw labs but infiltration noted with attempt to flush; deaccessed.

## 2015-09-20 NOTE — ED Notes (Addendum)
Lequita HaltMorgan, RN requested an US IV for pt. Pt daughter requested that IV not be placed or any other measures that are not necessary. Primary RN notified

## 2015-09-20 NOTE — ED Notes (Signed)
Muthersbaugh at bedside and aware of current status.

## 2015-09-20 NOTE — Discharge Instructions (Signed)
1. Medications: usual home medications 2. Treatment: rest, drink plenty of fluids, keep wounds clean with warm soap and water 3. Follow Up: Please followup with your primary doctor in as needed for discussion of your diagnoses and further evaluation after today's visit; Please return to the ER for as needed

## 2015-09-20 NOTE — ED Notes (Signed)
Pt BIB EMS from [facility]; pt was reaching down to pick something up this morning and leaned to far, causing her to fall and hit her head on linoleum; pt has laceration on forehead; pt forehead is wrapped in bandage at present; pt denies LOC, confusion, and other injuries; pt only c/o headache.

## 2015-09-20 NOTE — Progress Notes (Signed)
CSW spoke with patient at bedside. CSW staffed with nurse before speaking with patient. Per nurse, patient is oriented. Patient spoke softly, however, she was oriented.   Patient stated she is from [facility]. Patient stated she had a fall. Patient stated she has fallen once in the past six months. Patient stated the only equipment she uses is a wheelchair. Patient stated she can complete some ADL's. Patient stated she has a daughter, Sallye OberLouise as a support. Patient was unable to provide a contact number for her daughter.    Patient had no questions at this time.   Elenore PaddyLaVonia Vicki Chaffin, LCSWA [phone] ED CSW 09/20/2015 10:34 AM

## 2015-09-20 NOTE — ED Notes (Signed)
Bed: [id number] Expected date:  Expected time:  Means of arrival:  Comments: EMS-fall, head lac

## 2015-09-29 DIAGNOSIS — I495 Sick sinus syndrome: Secondary | ICD-10-CM | POA: Diagnosis not present

## 2015-09-29 DIAGNOSIS — M109 Gout, unspecified: Secondary | ICD-10-CM | POA: Diagnosis not present

## 2015-09-29 DIAGNOSIS — J449 Chronic obstructive pulmonary disease, unspecified: Secondary | ICD-10-CM | POA: Diagnosis not present

## 2015-09-29 DIAGNOSIS — G309 Alzheimer's disease, unspecified: Secondary | ICD-10-CM | POA: Diagnosis not present

## 2015-09-29 DIAGNOSIS — E559 Vitamin D deficiency, unspecified: Secondary | ICD-10-CM | POA: Diagnosis not present

## 2015-09-29 DIAGNOSIS — I10 Essential (primary) hypertension: Secondary | ICD-10-CM | POA: Diagnosis not present

## 2015-09-29 DIAGNOSIS — I739 Peripheral vascular disease, unspecified: Secondary | ICD-10-CM | POA: Diagnosis not present

## 2015-09-29 DIAGNOSIS — F339 Major depressive disorder, recurrent, unspecified: Secondary | ICD-10-CM | POA: Diagnosis not present

## 2015-09-29 DIAGNOSIS — R630 Anorexia: Secondary | ICD-10-CM | POA: Diagnosis not present

## 2015-09-29 DIAGNOSIS — I509 Heart failure, unspecified: Secondary | ICD-10-CM | POA: Diagnosis not present

## 2015-09-29 DIAGNOSIS — I519 Heart disease, unspecified: Secondary | ICD-10-CM | POA: Diagnosis not present

## 2015-09-29 DIAGNOSIS — R634 Abnormal weight loss: Secondary | ICD-10-CM | POA: Diagnosis not present

## 2015-09-29 DIAGNOSIS — H11149 Conjunctival xerosis, unspecified, unspecified eye: Secondary | ICD-10-CM | POA: Diagnosis not present

## 2015-10-16 ENCOUNTER — Telehealth: Payer: Self-pay | Admitting: *Deleted

## 2015-10-16 NOTE — Telephone Encounter (Signed)
Misty Stanley, nurse with Hospice called and Left message on voicemail yesterday stating that she saw patient and she has a productive cough with yellow sputum with fever of 100.6. Had Chest X-Ray done on 10/14/15 with [facility] and it come back No active cardiopulmonary disease. [name], daughter of patient would like order for Mucinex. Patient is also having a hard time sleeping and would like an order for Melatonin. Given to Dr. Chilton Si to address.  At 4:55pm Misty Stanley, nurse with Hospice calls back and leaves voice message that the Hospice Dr. Sonia Baller already gave verbal orders for both medications and to disregard.

## 2015-10-17 NOTE — Telephone Encounter (Signed)
Per Dr. Cathe Mons notify [name] that CXR does not show pneumonia. Continue current txs. [name], daughter notified and agreed.

## 2015-10-21 ENCOUNTER — Telehealth: Payer: Self-pay | Admitting: *Deleted

## 2015-10-21 MED ORDER — PREDNISONE 10 MG PO TABS: ORAL_TABLET | ORAL | Status: DC

## 2015-10-21 NOTE — Telephone Encounter (Signed)
Ok to start prednisone  x 2 days,  x 2 days,  x 2 days,  x 2 days,  x 2 days and stop Encourage hydration

## 2015-10-21 NOTE — Telephone Encounter (Signed)
Lisa with Hospice Notified and faxed to [phone]

## 2015-10-21 NOTE — Telephone Encounter (Signed)
Lisa with Hospice called and stated that patient has SOB and wheezing in Lungs. RR 26, T 100.2, BP 130/80, Pulse 49.  Patient has CXR on 10/14/15 that was Negative and was started on Mucinex.  They want to know if patient should do another Chest X-Ray or start Prednisone? Please Advise.

## 2015-10-28 ENCOUNTER — Other Ambulatory Visit: Payer: Self-pay | Admitting: *Deleted

## 2015-10-28 MED ORDER — CLONAZEPAM 1 MG PO TABS: 1.0000 mg | ORAL_TABLET | Freq: Every day | ORAL | Status: DC

## 2015-10-28 NOTE — Telephone Encounter (Signed)
Leisure centre manager

## 2015-10-30 DIAGNOSIS — F339 Major depressive disorder, recurrent, unspecified: Secondary | ICD-10-CM | POA: Diagnosis not present

## 2015-10-30 DIAGNOSIS — I739 Peripheral vascular disease, unspecified: Secondary | ICD-10-CM | POA: Diagnosis not present

## 2015-10-30 DIAGNOSIS — I519 Heart disease, unspecified: Secondary | ICD-10-CM | POA: Diagnosis not present

## 2015-10-30 DIAGNOSIS — H11149 Conjunctival xerosis, unspecified, unspecified eye: Secondary | ICD-10-CM | POA: Diagnosis not present

## 2015-10-30 DIAGNOSIS — I10 Essential (primary) hypertension: Secondary | ICD-10-CM | POA: Diagnosis not present

## 2015-10-30 DIAGNOSIS — G309 Alzheimer's disease, unspecified: Secondary | ICD-10-CM | POA: Diagnosis not present

## 2015-10-30 DIAGNOSIS — R634 Abnormal weight loss: Secondary | ICD-10-CM | POA: Diagnosis not present

## 2015-10-30 DIAGNOSIS — M109 Gout, unspecified: Secondary | ICD-10-CM | POA: Diagnosis not present

## 2015-10-30 DIAGNOSIS — I509 Heart failure, unspecified: Secondary | ICD-10-CM | POA: Diagnosis not present

## 2015-10-30 DIAGNOSIS — R630 Anorexia: Secondary | ICD-10-CM | POA: Diagnosis not present

## 2015-10-30 DIAGNOSIS — I495 Sick sinus syndrome: Secondary | ICD-10-CM | POA: Diagnosis not present

## 2015-10-30 DIAGNOSIS — E559 Vitamin D deficiency, unspecified: Secondary | ICD-10-CM | POA: Diagnosis not present

## 2015-10-30 DIAGNOSIS — J449 Chronic obstructive pulmonary disease, unspecified: Secondary | ICD-10-CM | POA: Diagnosis not present

## 2015-10-31 ENCOUNTER — Encounter: Payer: Self-pay | Admitting: Internal Medicine

## 2015-11-11 ENCOUNTER — Ambulatory Visit: Payer: Medicare Other | Admitting: Internal Medicine

## 2015-12-03 DIAGNOSIS — N390 Urinary tract infection, site not specified: Secondary | ICD-10-CM | POA: Diagnosis not present

## 2016-01-02 ENCOUNTER — Encounter: Payer: Self-pay | Admitting: Internal Medicine

## 2016-01-06 ENCOUNTER — Ambulatory Visit (HOSPITAL_COMMUNITY): Payer: Medicaid - Dental | Admitting: Dentistry

## 2016-01-06 ENCOUNTER — Encounter (HOSPITAL_COMMUNITY): Payer: Self-pay | Admitting: Dentistry

## 2016-01-06 VITALS — BP 211/53 | HR 41 | Temp 97.5°F

## 2016-01-06 DIAGNOSIS — IMO0002 Reserved for concepts with insufficient information to code with codable children: Secondary | ICD-10-CM

## 2016-01-06 DIAGNOSIS — K060 Gingival recession: Secondary | ICD-10-CM

## 2016-01-06 DIAGNOSIS — K036 Deposits [accretions] on teeth: Secondary | ICD-10-CM

## 2016-01-06 DIAGNOSIS — I10 Essential (primary) hypertension: Secondary | ICD-10-CM

## 2016-01-06 DIAGNOSIS — K0530 Chronic periodontitis, unspecified: Secondary | ICD-10-CM

## 2016-01-06 NOTE — Patient Instructions (Signed)
1. Brush teeth after meals and at bedtime. 2. Return to clinic as scheduled in 6 months or call problems arise before then. Dr. Kristin BruinsKulinski

## 2016-01-06 NOTE — Progress Notes (Signed)
Periodic oral examination  Date of Exam:   01/06/2016 Patient Name:   [name] Date of Birth:   Dec 10, 1915 Medical Record Number: [id number]  VITALS: BP 211/53 mmHg  Pulse 41  Temp(Src) 97.5 F (36.4 C) (Oral) Heart rate noted and is consistent with previous values. Elavated systolic blood pressure noted as well. Reviewed previous vitals signs in [facility].  CHIEF COMPLAINT: Patient seen for periodic dental examination and cleaning.  HPI: [name] is an 82 year old that presents for periodical examination and dental cleaning.  The patient has multiple medical problems and currently resides at [facility].  The patient does have a previous right hip replacement and Dr. Jerl Santos was previously contacted and indicated that antibiotic premedication prior to invasive dental procedures was NOT indicated. The patient currently denies acute toothache, swellings, or abscesses. Patient is still complaining of some sensitivity of the upper right premolar. Patient again denies toothache-type symptoms.Periapical will be taken #5.  Patient was last seen by Dental Medicine for an exam and cleaning in October of 2016.    PROBLEM LIST: Patient Active Problem List   Diagnosis Date Noted  . Blister of foot with infection 07/30/2015  . Edema 07/30/2015  . Bradycardia 12/17/2014  . Inability to ambulate due to knee 11/03/2014  . Wound, open, leg 08/01/2014  . Severe malnutrition (HCC) 08/14/2013    Class: Chronic  . PNA (pneumonia) 08/10/2013  . Cough 08/09/2013  . Failed total hip arthroplasty with dislocation (HCC) 08/08/2013    Class: Acute  . Other malaise and fatigue 03/25/2013  . Senile osteoporosis   . Vitamin D deficiency   . Closed dislocation of shoulder, unspecified site   . Spinal stenosis, lumbar region, with neurogenic claudication   . Alzheimer's disease 02/06/2010  . Essential hypertension 02/06/2010  . ASTHMA, UNSPECIFIED, UNSPECIFIED STATUS  02/06/2010  . ABNORMAL EKG 02/06/2010    PMH: Past Medical History  Diagnosis Date  . COPD (chronic obstructive pulmonary disease) (HCC)   . Hypertension   . Oral aphthae   . Orthostatic hypotension   . Senile osteoporosis   . Atrophy of vulva   . Unspecified urinary incontinence   . Urinary frequency   . Unspecified vitamin D deficiency   . Anxiety state, unspecified   . Restless legs syndrome (RLS)   . Unspecified disorder of kidney and ureter   . Atrioventricular block, unspecified   . Closed dislocation of shoulder, unspecified site   . Anemia, unspecified   . Altered mental status   . Lumbago   . Transient disorder of initiating or maintaining sleep   . Cellulitis and abscess of unspecified site   . Sebaceous cyst   . Insomnia, unspecified   . Dementia in conditions classified elsewhere without behavioral disturbance   . Depressive disorder, not elsewhere classified   . Abnormality of gait   . Adult failure to thrive   . Headache(784.0)   . Gout, unspecified   . Extrinsic asthma, unspecified   . Chronic kidney disease, stage I   . Osteoarthrosis, unspecified whether generalized or localized, unspecified site   . Spinal stenosis, unspecified region other than cervical   . Edema   . Tachycardia, unspecified     PSH: Past Surgical History  Procedure Laterality Date  . Hip surgery      (R) hip replacement  . Cesarean section    . Abdominal hysterectomy      partial  . Back surgery      fusion  .  Breast surgery      bilateral breast reduction  . Eye surgery      bilateral cataract  . Basal cell carcinoma excision    . Basal cell carcinoma excision      face  . Hip closed reduction Right 08/07/2013    Procedure: ATTEMPTED CLOSED MANIPULATION HIP;  Surgeon: Jodi Marbleavid A Thompson, MD;  [facility];  Service: Orthopedics;  Laterality: Right;  . Total hip revision Right 08/08/2013    Procedure: TOTAL HIP REVISION- right;  Surgeon: Velna OchsPeter G Dalldorf, MD;   Location: [facility] OR;  Service: Orthopedics;  Laterality: Right;    ALLERGIES: Allergies  Allergen Reactions  . Sulfa Antibiotics Shortness Of Breath    Worsens asthma  . Clonidine Derivatives     Unknown reaction Per MAR   . Lactose Intolerance (Gi)     Unknown reaction Per MAR   . Verapamil     Unknown reaction Per MAR    MEDICATIONS: Current Outpatient Prescriptions  Medication Sig Dispense Refill  . acetaminophen (TYLENOL) 325 MG tablet Take 650 mg by mouth every 6 (six) hours as needed for mild pain or moderate pain.     Marland Kitchen. alum & mag hydroxide-simeth (MYLANTA DOUBLE-STRENGTH) 400-400-40 MG/5ML suspension Take 30 mLs by mouth every 6 (six) hours as needed for indigestion (heartburn and upset stomach). Reported on 01/06/2016    . capsicum oleoresin (TRIXAICIN) 0.025 % cream Apply 1 application topically 2 (two) times daily as needed.    . cholecalciferol (VITAMIN D) 1000 UNITS tablet Take 1,000 Units by mouth daily.    . clonazePAM (KLONOPIN) 1 MG tablet Take 1 tablet (1 mg total) by mouth at bedtime. 30 tablet 5  . diphenhydrAMINE (BENADRYL READYMIST) spray Apply 2 sprays topically every 8 (eight) hours as needed (for burning). Apply to right foot blistered area for burning    . furosemide (LASIX) 20 MG tablet Take 1 tablet (20 mg total) by mouth every other day. 45 tablet 3  . HYDROcodone-acetaminophen (NORCO) 10-325 MG tablet Take 1 tablet by mouth 2 (two) times daily.    Marland Kitchen. HYDROcodone-acetaminophen (NORCO) 10-325 MG tablet Take 1 tablet by mouth every 4 (four) hours as needed. Reported on 01/06/2016    . lisinopril (PRINIVIL,ZESTRIL) 40 MG tablet Take 40 mg by mouth daily.    . Melatonin 3 MG TABS Take by mouth at bedtime.    . mirtazapine (REMERON) 7.5 MG tablet Take 7.5 mg by mouth at bedtime.    Bertram Gala. Polyethyl Glycol-Propyl Glycol (SYSTANE) 0.4-0.3 % SOLN Instill one drop into each eye two times daily to relieve dry,itchy eye. 30 mL 5  . potassium chloride SA (K-DUR,KLOR-CON) 20 MEQ  tablet Take 1 tablet (20 mEq total) by mouth every other day. For chf 45 tablet 3  . ranitidine (ZANTAC) 150 MG tablet Take 150 mg by mouth every evening. Reported on 01/06/2016    . senna-docusate (SENOKOT-S) 8.6-50 MG per tablet Take 1 tablet by mouth daily.    Marland Kitchen. docusate sodium (COLACE) 100 MG capsule Take 100 mg by mouth 2 (two) times daily. Reported on 01/06/2016     No current facility-administered medications for this visit.    LABS: Lab Results  Component Value Date   WBC 6.3 09/20/2015   HGB 12.2 09/20/2015   HCT 38.3 09/20/2015   MCV 87.4 09/20/2015   PLT 198 09/20/2015      Component Value Date/Time   NA 144 09/20/2015 1032   NA 142 09/05/2013 0915   K 4.3  09/20/2015 1032   CL 109 09/20/2015 1032   CO2 27 09/20/2015 1032   GLUCOSE 95 09/20/2015 1032   GLUCOSE 89 09/05/2013 0915   BUN 33* 09/20/2015 1032   BUN 14 09/05/2013 0915   CREATININE 1.03* 09/20/2015 1032   CALCIUM 9.7 09/20/2015 1032   GFRNONAA 44* 09/20/2015 1032   GFRAA 51* 09/20/2015 1032   Lab Results  Component Value Date   INR 1.4 05/03/2009   INR 1.3 05/02/2009   INR 1.1 05/01/2009   No results found for: PTT   DENTAL HISTORY: CHIEF COMPLAINT: Patient seen for periodic dental examination and cleaning.  HPI: LAYSHA CHILDERS is a 82 year old that presents for periodical examination and dental cleaning.  The patient has multiple medical problems and currently resides at [facility].  The patient does have a previous right hip replacement and Dr. Jerl Santos was previously contacted and indicated that antibiotic premedication prior to invasive dental procedures was NOT indicated. The patient currently denies acute toothache, swellings, or abscesses. Patient is still complaining of some sensitivity of the upper right premolar. Patient again denies toothache-type symptoms. Periapical will be taken #5. Patient was last seen by Dental Medicine for an exam and cleaning in October of  2016.   DENTAL EXAMINATION: GENERAL: The patient is a well-developed, slightly built female in no acute distress. The patient walks with the aid of a walker.  HEAD AND NECK: There is no palpable lymphadenopathy. The patient denies acute TMJ symptoms but has bilateral TMJ crepitus. INTRAORAL EXAM: The patient has incipient xerostomia. There is no evidence of oral abscess formation. DENTITION: The patient is missing tooth numbers 1, 2, 3, 4, 14, 15, 16, 17, 18, 19, 20, 21, 29, 30, 31, and 32. Multiple rotated teeth are noted. PERIODONTAL:  The patient has chronic periodontitis with plaque and calculus accumulations, generalized gingival recession, and incipient tooth mobility. Tooth numbers 23 and 24 are splinted together by resin. Mandibular anterior calculus on the lingual aspect was noted and oral hygiene improvement was suggested. DENTAL CARIES/SUBOPTIMAL RESTORATIONS: Multiple flexure lesions are noted. ENDODONTIC: The patient currently denies acute pulpitis symptoms. Periapical radiograph of tooth #5 does NOT reveal periapical pathology. CROWN AND BRIDGE: There are no crown or bridge restorations. PROSTHODONTIC: Patient currently has no partial dentures that she is wearing. OCCLUSION: Patient has a poor occlusal scheme secondary to multiple missing teeth, multiple rotated teeth, supra-eruption and drifting of the unopposed teeth into the edentulous areas, and lack of replacement of all missing teeth with dental prostheses. Gingival recession with some traumatic occlusion may be responsible for some increased sensitivity of the upper right quadrant. Radiographic Interpretation: PA radiograph tooth #5 does not reveal periapical pathology. Bone loss was noted. No obvious dental caries noted previous dental restoration was noted. Rotated tooth was noted.  ASSESSMENTS: 1. Chronic periodontitis with bone loss 2. Gingival recession 3. Incipient tooth mobility 4. Accretions 5. Multiple flexure  lesions 6. Multiple missing teeth 7. Supra-eruption and drifting of the unopposed teeth into the edentulous areas 8  Multiple rotated teeth 9. No current upper or lower partial dentures 10. Poor occlusal scheme but stable malocclusion.  11. Gingival recession and some occlusal trauma may be contributing to increased to sensitivity involving the upper right quadrant. 12. Status post total hip replacement with NO need for antibiotic premedication prior to invasive dental procedures per orthopedic surgeon-Dr. Marcene Corning  Procedure: Periapical radiograph of tooth #5 Adult prophylaxis with Cavitron and multiple curettes as needed. Teeth polished. Fluoride varnish was  applied. Oral hygiene instructions on brushing after meals and at bedtime and flossing at bedtime was provided today with emphasis on mandibular anterior lingual surfaces. Patient tolerated the procedure well. Patient was dismissed to the caregiver in stable condition. Patient is to return to clinic for periodontal maintenance in 6 months. Call if problems arise before then.   Charlynne Pander, DDS

## 2016-01-07 DIAGNOSIS — H43392 Other vitreous opacities, left eye: Secondary | ICD-10-CM | POA: Diagnosis not present

## 2016-01-07 DIAGNOSIS — H26491 Other secondary cataract, right eye: Secondary | ICD-10-CM | POA: Diagnosis not present

## 2016-01-07 DIAGNOSIS — H401132 Primary open-angle glaucoma, bilateral, moderate stage: Secondary | ICD-10-CM | POA: Diagnosis not present

## 2016-01-07 DIAGNOSIS — Z961 Presence of intraocular lens: Secondary | ICD-10-CM | POA: Diagnosis not present

## 2016-02-19 ENCOUNTER — Telehealth: Payer: Self-pay | Admitting: *Deleted

## 2016-02-19 NOTE — Telephone Encounter (Signed)
Noted, thanks!

## 2016-02-19 NOTE — Telephone Encounter (Signed)
[name] with Hospice called and stated that patient is having pain in the 4th and 5th toe of the Left foot. Swollen, red, throbs and swelling. Nurse wanted to let you know the Hospice Dr. Prescribed a Prednisone Taper for her.

## 2016-04-02 ENCOUNTER — Other Ambulatory Visit: Payer: Self-pay | Admitting: *Deleted

## 2016-04-02 MED ORDER — CLONAZEPAM 1 MG PO TABS: 1.0000 mg | ORAL_TABLET | Freq: Every day | ORAL | Status: DC

## 2016-05-20 IMAGING — CR DG KNEE COMPLETE 4+V*R*
4 series · 4 of 4 positions shown · non-contrast
Comparison: Plain films of right lower leg 06/05/2009.

CLINICAL DATA: Status post slip and fall today. Right knee pain.
Initial encounter.

EXAM:
RIGHT KNEE - COMPLETE 4+ VIEW

[x knee ap right]
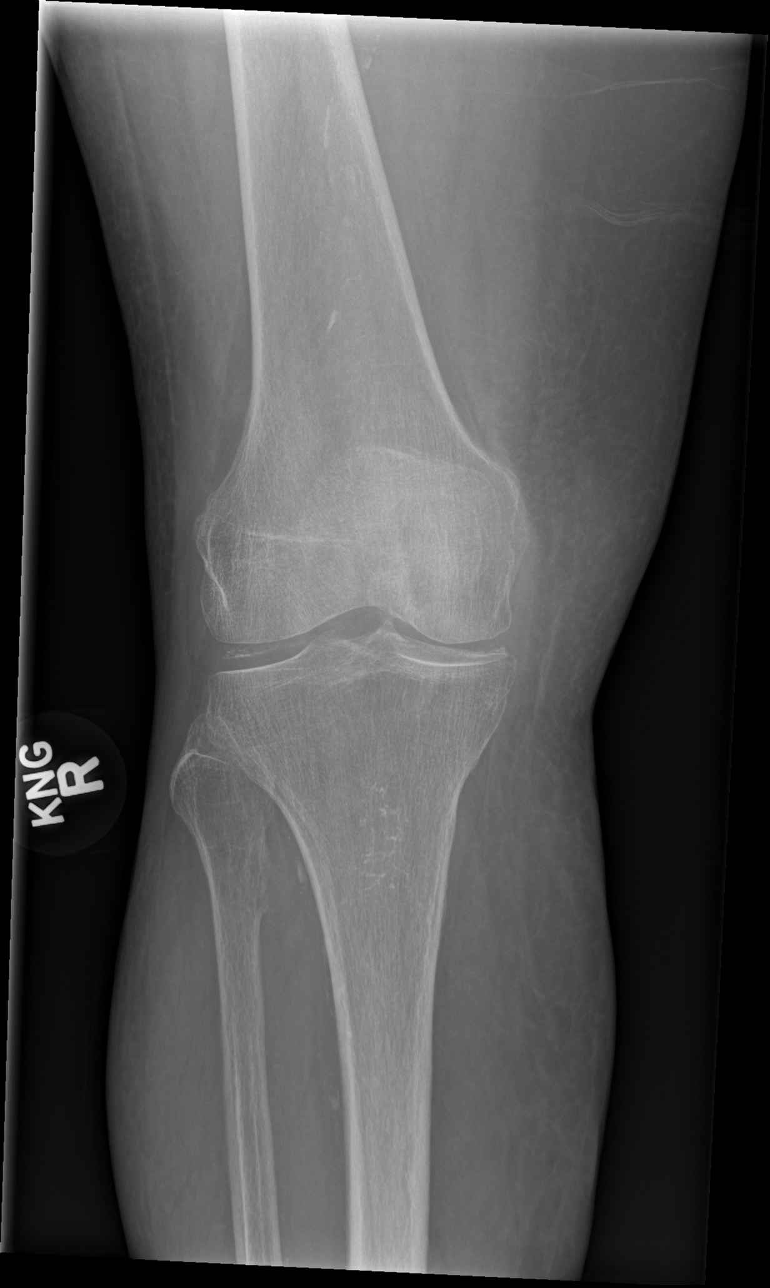

[x knee obl right (1 of 2)]
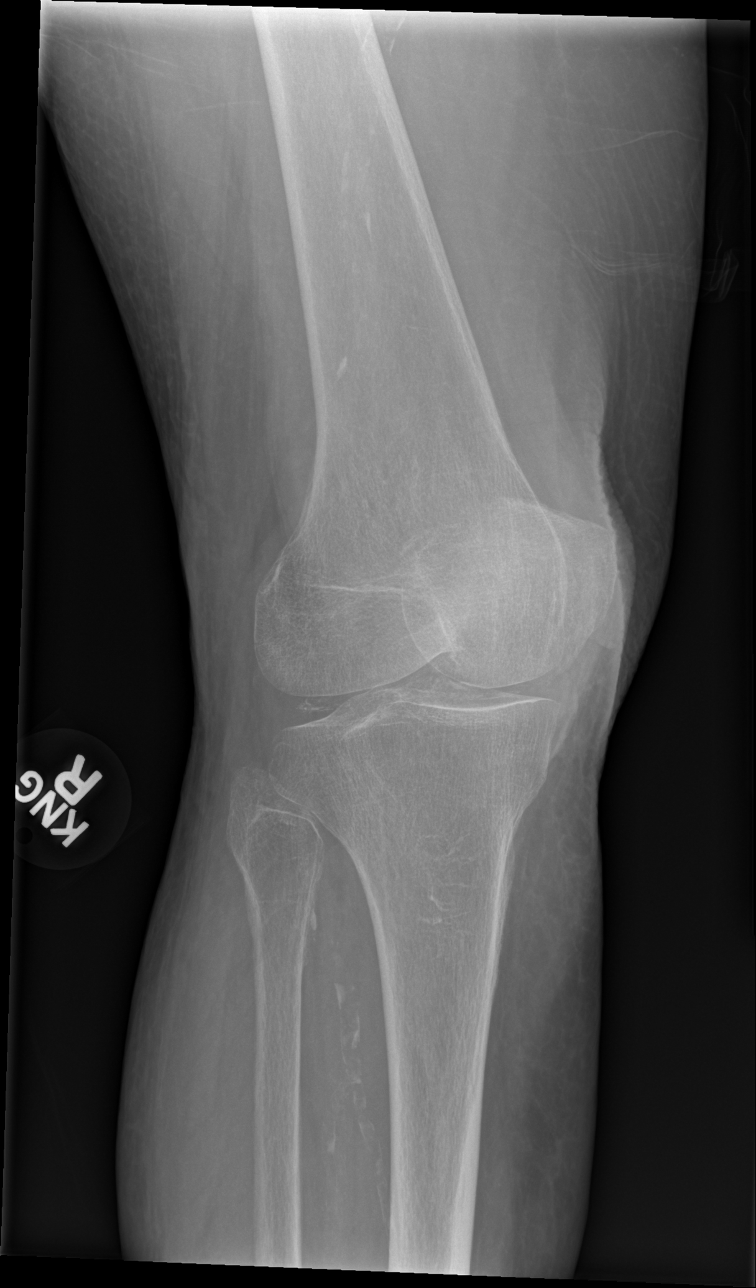

[x knee obl right (2 of 2)]
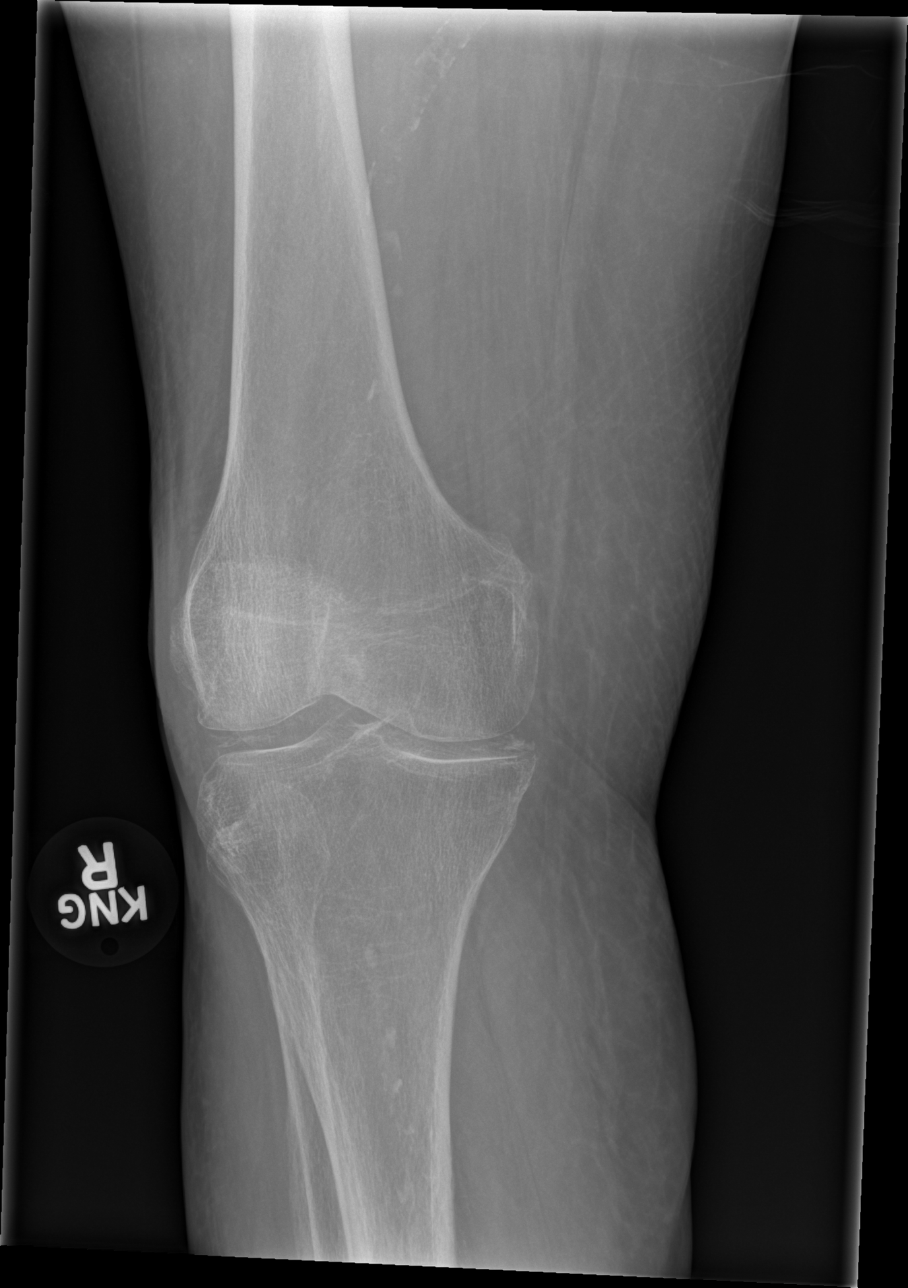

[x knee lat right]
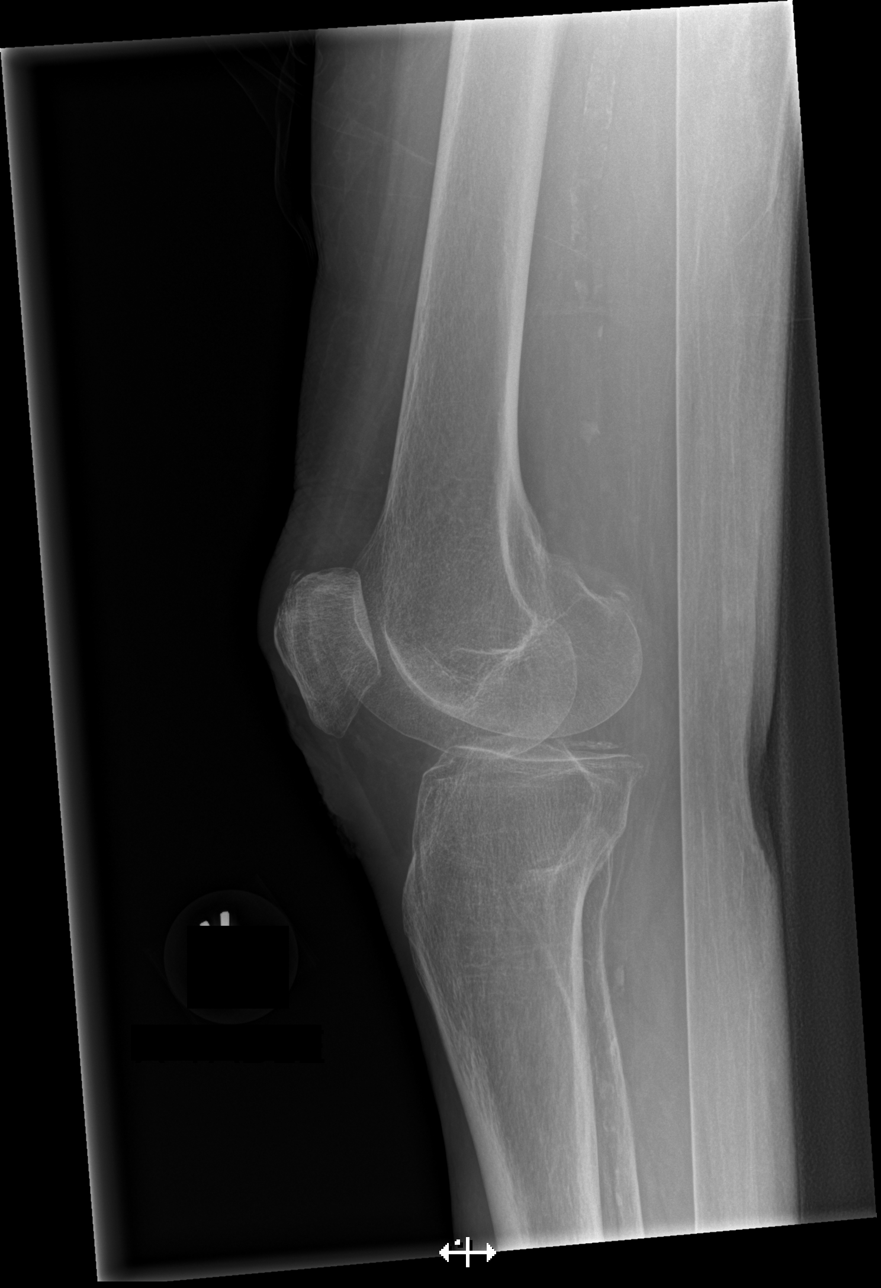

[4 of 4 positions shown; findings below may reference images not displayed]

FINDINGS: No acute bony or joint abnormality is identified. There is no joint
effusion. Chondrocalcinosis is noted. Joint spaces appear preserved.
Atherosclerotic vascular disease is identified.
IMPRESSION: No acute abnormality.

Chondrocalcinosis.

## 2016-05-22 ENCOUNTER — Telehealth: Payer: Self-pay | Admitting: *Deleted

## 2016-05-22 NOTE — Telephone Encounter (Signed)
Amy with Hospice called and stated that she went and saw [facility] today and she was having dizziness related to her bradycardia. BP 172/72. Lungs fine, Drowsy feeling. Taking Lisinopril 40mg  daily. Hospice nurse wanted to know if you had any instructions. Please Advise.

## 2016-05-22 NOTE — Telephone Encounter (Signed)
Only suggestion is to be careful if she is getting up so she does not fall.  Unfortunately, any change in lisinopril will not affect her pulse.  She has chronic bradycardia.

## 2016-05-22 NOTE — Telephone Encounter (Signed)
Amy Notified and agreed.

## 2016-06-08 ENCOUNTER — Other Ambulatory Visit: Payer: Self-pay | Admitting: *Deleted

## 2016-06-08 MED ORDER — HYDROCODONE-ACETAMINOPHEN 10-325 MG PO TABS: 1.0000 | ORAL_TABLET | Freq: Two times a day (BID) | ORAL | 0 refills | Status: DC

## 2016-06-08 NOTE — Telephone Encounter (Signed)
[facility] Fax Order 

## 2016-06-20 ENCOUNTER — Encounter (HOSPITAL_COMMUNITY): Payer: Self-pay | Admitting: Emergency Medicine

## 2016-06-20 ENCOUNTER — Emergency Department (HOSPITAL_COMMUNITY)

## 2016-06-20 ENCOUNTER — Emergency Department (HOSPITAL_COMMUNITY)
Admission: EM | Admit: 2016-06-20 | Discharge: 2016-06-20 | Disposition: A | Discharge status: Home / Self Care | Attending: Emergency Medicine | Admitting: Emergency Medicine

## 2016-06-20 DIAGNOSIS — S098XXA Other specified injuries of head, initial encounter: Secondary | ICD-10-CM | POA: Diagnosis not present

## 2016-06-20 DIAGNOSIS — G309 Alzheimer's disease, unspecified: Secondary | ICD-10-CM | POA: Diagnosis not present

## 2016-06-20 DIAGNOSIS — N181 Chronic kidney disease, stage 1: Secondary | ICD-10-CM | POA: Insufficient documentation

## 2016-06-20 DIAGNOSIS — F0281 Dementia in other diseases classified elsewhere with behavioral disturbance: Secondary | ICD-10-CM | POA: Insufficient documentation

## 2016-06-20 DIAGNOSIS — Y929 Unspecified place or not applicable: Secondary | ICD-10-CM | POA: Diagnosis not present

## 2016-06-20 DIAGNOSIS — S01112A Laceration without foreign body of left eyelid and periocular area, initial encounter: Secondary | ICD-10-CM | POA: Insufficient documentation

## 2016-06-20 DIAGNOSIS — Y939 Activity, unspecified: Secondary | ICD-10-CM | POA: Insufficient documentation

## 2016-06-20 DIAGNOSIS — I129 Hypertensive chronic kidney disease with stage 1 through stage 4 chronic kidney disease, or unspecified chronic kidney disease: Secondary | ICD-10-CM | POA: Diagnosis not present

## 2016-06-20 DIAGNOSIS — Z79899 Other long term (current) drug therapy: Secondary | ICD-10-CM | POA: Insufficient documentation

## 2016-06-20 DIAGNOSIS — I442 Atrioventricular block, complete: Secondary | ICD-10-CM | POA: Diagnosis not present

## 2016-06-20 DIAGNOSIS — J449 Chronic obstructive pulmonary disease, unspecified: Secondary | ICD-10-CM | POA: Diagnosis not present

## 2016-06-20 DIAGNOSIS — W010XXA Fall on same level from slipping, tripping and stumbling without subsequent striking against object, initial encounter: Secondary | ICD-10-CM | POA: Insufficient documentation

## 2016-06-20 DIAGNOSIS — W19XXXA Unspecified fall, initial encounter: Secondary | ICD-10-CM

## 2016-06-20 DIAGNOSIS — S0091XA Abrasion of unspecified part of head, initial encounter: Secondary | ICD-10-CM | POA: Diagnosis not present

## 2016-06-20 DIAGNOSIS — Z96641 Presence of right artificial hip joint: Secondary | ICD-10-CM | POA: Insufficient documentation

## 2016-06-20 DIAGNOSIS — R51 Headache: Secondary | ICD-10-CM | POA: Insufficient documentation

## 2016-06-20 DIAGNOSIS — Y999 Unspecified external cause status: Secondary | ICD-10-CM | POA: Insufficient documentation

## 2016-06-20 MED ORDER — SODIUM CHLORIDE 0.9 % IV SOLN
Freq: Once | INTRAVENOUS | Status: DC
Start: 2016-06-20 — End: 2016-06-21

## 2016-06-20 MED ORDER — LIDOCAINE HCL (PF) 1 % IJ SOLN
INTRAMUSCULAR | Status: AC
  Filled 2016-06-20: qty 5

## 2016-06-20 MED ORDER — LIDOCAINE HCL (PF) 1 % IJ SOLN
30.0000 mL | Freq: Once | INTRAMUSCULAR | Status: AC
  Administered 2016-06-20: 30 mL via INTRADERMAL

## 2016-06-20 NOTE — ED Triage Notes (Signed)
Pt brought to ED by [facility] from [facility], pt had unwitnessed fall at nursing home, per staff pt fell backward but have a lac on her forehead and hematoma on her back head. BP for EMS 300 palpable, HR 38 mechanical  3erd heart block with bigeminy on EKG. Pt AO x 4 CBG 113, Hx of dementia.

## 2016-06-20 NOTE — ED Provider Notes (Signed)
[facility]-EMERGENCY DEPT Provider Note   CSN: [id number] Arrival date & time: 06/20/16  2003     History   Chief Complaint Chief Complaint  Patient presents with  . Fall    HPI [name] is a 82 y.o. female.  The history is provided by the patient, a relative and the EMS personnel (daughter).  Fall  This is a new problem. The current episode started less than 1 hour ago. Episode frequency: once. The problem has been resolved. Pertinent negatives include no chest pain, no abdominal pain, no headaches and no shortness of breath. Treatments tried: c-collar by EMS.    Past Medical History:  Diagnosis Date  . Abnormality of gait   . Adult failure to thrive   . Altered mental status   . Anemia, unspecified   . Anxiety state, unspecified   . Atrioventricular block, unspecified   . Atrophy of vulva   . Cellulitis and abscess of unspecified site   . Chronic kidney disease, stage I   . Closed dislocation of shoulder, unspecified site   . COPD (chronic obstructive pulmonary disease) (HCC)   . Dementia in conditions classified elsewhere without behavioral disturbance   . Depressive disorder, not elsewhere classified   . Edema   . Extrinsic asthma, unspecified   . Gout, unspecified   . Headache(784.0)   . Hypertension   . Insomnia, unspecified   . Lumbago   . Oral aphthae   . Orthostatic hypotension   . Osteoarthrosis, unspecified whether generalized or localized, unspecified site   . Restless legs syndrome (RLS)   . Sebaceous cyst   . Senile osteoporosis   . Spinal stenosis, unspecified region other than cervical   . Tachycardia, unspecified   . Transient disorder of initiating or maintaining sleep   . Unspecified disorder of kidney and ureter   . Unspecified urinary incontinence   . Unspecified vitamin D deficiency   . Urinary frequency     Patient Active Problem List   Diagnosis Date Noted  . Blister of foot with infection 07/30/2015  . Edema 07/30/2015  .  Bradycardia 12/17/2014  . Inability to ambulate due to knee 11/03/2014  . Wound, open, leg 08/01/2014  . Severe malnutrition (HCC) 08/14/2013    Class: Chronic  . PNA (pneumonia) 08/10/2013  . Cough 08/09/2013  . Failed total hip arthroplasty with dislocation (HCC) 08/08/2013    Class: Acute  . Other malaise and fatigue 03/25/2013  . Senile osteoporosis   . Vitamin D deficiency   . Closed dislocation of shoulder, unspecified site   . Spinal stenosis, lumbar region, with neurogenic claudication   . Alzheimer's disease 02/06/2010  . Essential hypertension 02/06/2010  . ASTHMA, UNSPECIFIED, UNSPECIFIED STATUS 02/06/2010  . ABNORMAL EKG 02/06/2010    Past Surgical History:  Procedure Laterality Date  . ABDOMINAL HYSTERECTOMY     partial  . BACK SURGERY     fusion  . BASAL CELL CARCINOMA EXCISION    . BASAL CELL CARCINOMA EXCISION     face  . BREAST SURGERY     bilateral breast reduction  . CESAREAN SECTION    . EYE SURGERY     bilateral cataract  . HIP CLOSED REDUCTION Right 08/07/2013   Procedure: ATTEMPTED CLOSED MANIPULATION HIP;  Surgeon: Jodi Marbleavid A Thompson, MD;  [facility];  Service: Orthopedics;  Laterality: Right;  . HIP SURGERY     (R) hip replacement  . TOTAL HIP REVISION Right 08/08/2013   Procedure: TOTAL HIP REVISION- right;  Surgeon: Velna Ochs, MD;  Location: [facility] OR;  Service: Orthopedics;  Laterality: Right;    OB History    No data available       Home Medications    Prior to Admission medications   Medication Sig Start Date End Date Taking? Authorizing Provider  acetaminophen (TYLENOL) 325 MG tablet Take 650 mg by mouth every 6 (six) hours as needed for mild pain or moderate pain.     Historical Provider, MD  alum & mag hydroxide-simeth (MYLANTA DOUBLE-STRENGTH) 400-400-40 MG/5ML suspension Take 30 mLs by mouth every 6 (six) hours as needed for indigestion (heartburn and upset stomach). Reported on 01/06/2016    Historical Provider, MD    capsicum oleoresin (TRIXAICIN) 0.025 % cream Apply 1 application topically 2 (two) times daily as needed.    Historical Provider, MD  cholecalciferol (VITAMIN D) 1000 UNITS tablet Take 1,000 Units by mouth daily.    Historical Provider, MD  clonazePAM (KLONOPIN) 1 MG tablet Take 1 tablet (1 mg total) by mouth at bedtime. 04/02/16   Tiffany L Reed, DO  diphenhydrAMINE (BENADRYL READYMIST) spray Apply 2 sprays topically every 8 (eight) hours as needed (for burning). Apply to right foot blistered area for burning    Historical Provider, MD  docusate sodium (COLACE) 100 MG capsule Take 100 mg by mouth 2 (two) times daily. Reported on 01/06/2016    Historical Provider, MD  furosemide (LASIX) 20 MG tablet Take 1 tablet (20 mg total) by mouth every other day. 03/05/15   Sharon Seller, NP  HYDROcodone-acetaminophen (NORCO) 10-325 MG tablet Take 1 tablet by mouth every 4 (four) hours as needed. Reported on 01/06/2016    Historical Provider, MD  HYDROcodone-acetaminophen (NORCO) 10-325 MG tablet Take 1 tablet by mouth 2 (two) times daily. 06/08/16   Tiffany L Reed, DO  lisinopril (PRINIVIL,ZESTRIL) 40 MG tablet Take 40 mg by mouth daily.    Historical Provider, MD  Melatonin 3 MG TABS Take by mouth at bedtime.    Historical Provider, MD  mirtazapine (REMERON) 7.5 MG tablet Take 7.5 mg by mouth at bedtime.    Historical Provider, MD  Polyethyl Glycol-Propyl Glycol (SYSTANE) 0.4-0.3 % SOLN Instill one drop into each eye two times daily to relieve dry,itchy eye. 02/01/14   Sharon Seller, NP  potassium chloride SA (K-DUR,KLOR-CON) 20 MEQ tablet Take 1 tablet (20 mEq total) by mouth every other day. For chf 12/17/14   Sharon Seller, NP  ranitidine (ZANTAC) 150 MG tablet Take 150 mg by mouth every evening. Reported on 01/06/2016    Historical Provider, MD  senna-docusate (SENOKOT-S) 8.6-50 MG per tablet Take 1 tablet by mouth daily.    Historical Provider, MD    Family History Family History  Problem Relation  Age of Onset  . Diabetes Mother   . Diabetes Father   . Stroke Sister   . Heart attack Brother     Social History Social History  Substance Use Topics  . Smoking status: Never Smoker  . Smokeless tobacco: Never Used  . Alcohol use No     Allergies   Sulfa antibiotics; Clonidine derivatives; Lactose intolerance (gi); and Verapamil   Review of Systems Review of Systems  HENT: Negative for nosebleeds, trouble swallowing and voice change.        Laceration of left eyebrow  Eyes: Negative for pain, redness and visual disturbance.  Respiratory: Negative for shortness of breath.   Cardiovascular: Negative for chest pain and leg swelling.  Gastrointestinal: Negative  for abdominal pain and vomiting.  Genitourinary: Negative for flank pain.  Musculoskeletal: Negative for back pain and neck pain.  Skin: Negative for rash.  Neurological: Negative for headaches.  Psychiatric/Behavioral: Negative for confusion.       Mentation at baseline     Physical Exam Updated Vital Signs BP (!) 200/115   Pulse (!) 55   Temp 98.3 F (36.8 C) (Oral)   Resp 20   Ht 4\' 11"  (1.499 m)   Wt 45.4 kg   SpO2 96%   BMI 20.20 kg/m   Physical Exam  Constitutional: She appears well-developed and well-nourished. No distress.  Elderly female, pleasant, cooperative, mildly confused (daughter states is baseline)  HENT:  Head: Normocephalic.  Right Ear: External ear normal.  Left Ear: External ear normal.  3.5 cm mostly linear laceration to underlying muscle of left eyebrow, no FB's  Eyes: Conjunctivae and EOM are normal. No scleral icterus.  Arcus senilis. Ecchymosis about the left eye  Neck: Normal range of motion. Neck supple. No JVD present.  Cardiovascular: Regular rhythm and intact distal pulses.   Bradycardic to 50's. 1-2+ radial and Dp's bilaterally  Pulmonary/Chest: Effort normal and breath sounds normal. No respiratory distress. She exhibits no tenderness.  Abdominal: Soft. There is no  tenderness.  Musculoskeletal: Normal range of motion. She exhibits no edema, tenderness or deformity.  Neurological: She is alert. No cranial nerve deficit. She exhibits normal muscle tone. Coordination normal.  Oriented to person, place, and situation, not time. Symmetric strength of b/l ue's and b/l le's  Skin: Skin is warm and dry. She is not diaphoretic.  Psychiatric: She has a normal mood and affect.  Nursing note and vitals reviewed.    ED Treatments / Results  Labs (all labs ordered are listed, but only abnormal results are displayed) Labs Reviewed - No data to display  EKG  EKG Interpretation  Date/Time:  Saturday June 20 2016 20:06:08 EDT Ventricular Rate:  52 PR Interval:    QRS Duration: 91 QT Interval:  442 QTC Calculation: 411 R Axis:   3 Text Interpretation:  Sinus rhythm Prolonged PR interval LVH with secondary repolarization abnormality Anterior infarct, old with 2nd degree A-V block (Mobitz II) Confirmed by ZAVITZ MD, JOSHUA [phone]) on 06/20/2016 8:37:41 PM       Radiology Ct Head Wo Contrast  Result Date: 06/20/2016 CLINICAL DATA:  Pain following fall EXAM: CT HEAD WITHOUT CONTRAST CT CERVICAL SPINE WITHOUT CONTRAST TECHNIQUE: Multidetector CT imaging of the head and cervical spine was performed following the standard protocol without intravenous contrast. Multiplanar CT image reconstructions of the cervical spine were also generated. COMPARISON:  Head CT November 02, 2013 FINDINGS: CT HEAD FINDINGS Brain: There is moderate diffuse atrophy. There is no intracranial mass, hemorrhage, extra-axial fluid collection, or midline shift. There is patchy small vessel disease in the centra semiovale bilaterally, stable. There is a prior small lacunar infarct in the left cerebellum, stable. There is a prior small lacunar infarct in the anterior left lentiform nucleus, stable. There is no new gray-white compartment lesion. No acute infarct evident. Vascular: There is stable  mild increased attenuation throughout the left middle cerebral artery, likely due to vascular calcification. There is no new hyperdense vessel. There is calcification in both carotid siphon regions. There is calcification in the distal vertebral arteries bilaterally. Skull: There is a left frontal scalp hematoma which extends to the level of the superior left orbit in a preseptal location. The bony calvarium appears intact. Sinuses/Orbits: There is  mild mucosal thickening in several ethmoid air cells bilaterally. Other paranasal sinuses are clear. No intraorbital lesions are evident. Other: The mastoid air cells are clear. CT CERVICAL SPINE FINDINGS Alignment: There is minimal anterolisthesis of C4 on C5. There is minimal retrolisthesis of C5 on C6. There is minimal anterolisthesis of T1 on T2. These changes are felt to be due to underlying spondylosis. Skull base and vertebrae: The craniocervical junction region appears unremarkable. There is mild pannus surrounding the odontoid. No fracture is evident. There are no blastic or lytic bone lesions. Soft tissues and spinal canal: Prevertebral soft tissues and predental space regions are normal. No spinal stenosis evident. Disc levels: There is moderately severe disc space narrowing at C5-6 and C6-7. There are anterior osteophytes at C4, C5, C6, and C7. There is facet hypertrophy at most levels bilaterally. There is no disc extrusion. There is exit foraminal narrowing at multiple levels due to bony hypertrophy, most notably at C3-4 on the right. Upper chest: There is mild scarring in the lung apices. Other: There is diffuse enlargement of the right lobe of the thyroid with multiple nodular opacities throughout this area. This appearance is consistent with multinodular goiter. There are foci of atherosclerotic calcification in proximal subclavian arteries bilaterally. There is also carotid artery calcification bilaterally. IMPRESSION: CT head: Atrophy with  periventricular small vessel disease. Prior small lacunar infarcts in the left cerebellum and anterior left lentiform nucleus. No intracranial mass, hemorrhage, or extra-axial fluid collection. No acute infarct. There is a left frontal scalp hematoma. No fracture. Mild ethmoid air cell mucosal thickening bilaterally. There are areas of arterial vascular calcification noted. CT cervical spine: No evident fracture. Mild spondylolisthesis at C4-5 and C5-6 is felt to be due to underlying spondylosis. There is multilevel osteoarthritic change. There are multiple foci of vascular calcification including calcification in both carotid arteries. There is enlargement of the right lobe of the thyroid with multinodular goiter in this area. The left lobe of thyroid appears somewhat diminutive. Electronically Signed   By: Bretta Bang III M.D.   On: 06/20/2016 21:01   Ct Cervical Spine Wo Contrast  Result Date: 06/20/2016 CLINICAL DATA:  Pain following fall EXAM: CT HEAD WITHOUT CONTRAST CT CERVICAL SPINE WITHOUT CONTRAST TECHNIQUE: Multidetector CT imaging of the head and cervical spine was performed following the standard protocol without intravenous contrast. Multiplanar CT image reconstructions of the cervical spine were also generated. COMPARISON:  Head CT November 02, 2013 FINDINGS: CT HEAD FINDINGS Brain: There is moderate diffuse atrophy. There is no intracranial mass, hemorrhage, extra-axial fluid collection, or midline shift. There is patchy small vessel disease in the centra semiovale bilaterally, stable. There is a prior small lacunar infarct in the left cerebellum, stable. There is a prior small lacunar infarct in the anterior left lentiform nucleus, stable. There is no new gray-white compartment lesion. No acute infarct evident. Vascular: There is stable mild increased attenuation throughout the left middle cerebral artery, likely due to vascular calcification. There is no new hyperdense vessel. There is  calcification in both carotid siphon regions. There is calcification in the distal vertebral arteries bilaterally. Skull: There is a left frontal scalp hematoma which extends to the level of the superior left orbit in a preseptal location. The bony calvarium appears intact. Sinuses/Orbits: There is mild mucosal thickening in several ethmoid air cells bilaterally. Other paranasal sinuses are clear. No intraorbital lesions are evident. Other: The mastoid air cells are clear. CT CERVICAL SPINE FINDINGS Alignment: There is minimal anterolisthesis of C4  on C5. There is minimal retrolisthesis of C5 on C6. There is minimal anterolisthesis of T1 on T2. These changes are felt to be due to underlying spondylosis. Skull base and vertebrae: The craniocervical junction region appears unremarkable. There is mild pannus surrounding the odontoid. No fracture is evident. There are no blastic or lytic bone lesions. Soft tissues and spinal canal: Prevertebral soft tissues and predental space regions are normal. No spinal stenosis evident. Disc levels: There is moderately severe disc space narrowing at C5-6 and C6-7. There are anterior osteophytes at C4, C5, C6, and C7. There is facet hypertrophy at most levels bilaterally. There is no disc extrusion. There is exit foraminal narrowing at multiple levels due to bony hypertrophy, most notably at C3-4 on the right. Upper chest: There is mild scarring in the lung apices. Other: There is diffuse enlargement of the right lobe of the thyroid with multiple nodular opacities throughout this area. This appearance is consistent with multinodular goiter. There are foci of atherosclerotic calcification in proximal subclavian arteries bilaterally. There is also carotid artery calcification bilaterally. IMPRESSION: CT head: Atrophy with periventricular small vessel disease. Prior small lacunar infarcts in the left cerebellum and anterior left lentiform nucleus. No intracranial mass, hemorrhage, or  extra-axial fluid collection. No acute infarct. There is a left frontal scalp hematoma. No fracture. Mild ethmoid air cell mucosal thickening bilaterally. There are areas of arterial vascular calcification noted. CT cervical spine: No evident fracture. Mild spondylolisthesis at C4-5 and C5-6 is felt to be due to underlying spondylosis. There is multilevel osteoarthritic change. There are multiple foci of vascular calcification including calcification in both carotid arteries. There is enlargement of the right lobe of the thyroid with multinodular goiter in this area. The left lobe of thyroid appears somewhat diminutive. Electronically Signed   By: Bretta Bang III M.D.   On: 06/20/2016 21:01    Procedures .Marland KitchenLaceration Repair Date/Time: 06/20/2016 11:40 PM Performed by: Horald Pollen Authorized by: Horald Pollen   Consent:    Consent obtained:  Verbal   Consent given by:  Patient (and daughter) Anesthesia (see MAR for exact dosages):    Anesthesia method:  Local infiltration   Local anesthetic:  Lidocaine 1% w/o epi Laceration details:    Location:  Face   Face location:  L eyebrow   Length (cm):  3.5   Depth (mm):  5 Repair type:    Repair type:  Simple Exploration:    Wound extent: no foreign bodies/material noted and no vascular damage noted     Contaminated: no   Treatment:    Area cleansed with:  Saline   Amount of cleaning:  Standard   Irrigation solution:  Sterile saline   Irrigation volume:  100cc   Irrigation method:  Syringe   Visualized foreign bodies/material removed: no   Skin repair:    Repair method:  Sutures   Suture size:  4-0   Suture material:  Plain gut (absorbable)   Suture technique:  Simple interrupted   Number of sutures:  6 Approximation:    Approximation:  Close   Vermilion border: well-aligned   Post-procedure details:    Dressing:  Antibiotic ointment   Patient tolerance of procedure:  Tolerated well, no immediate complications    (including critical care time)  Medications Ordered in ED Medications  0.9 %  sodium chloride infusion ( Intravenous Not Given 06/20/16 2035)  lidocaine (PF) (XYLOCAINE) 1 % injection (not administered)  lidocaine (PF) (XYLOCAINE) 1 % injection 30 mL (30 mLs  Intradermal Given 06/20/16 2035)     Initial Impression / Assessment and Plan / ED Course  I have reviewed the triage vital signs and the nursing notes.  Pertinent labs & imaging results that were available during my care of the patient were reviewed by me and considered in my medical decision making (see chart for details).  Clinical Course   FARIN BUHMAN is a 82 y.o. female with h/o mild dementia and known second degree type 2 heart block per daughter, who has very limited wishes for scope of treatment, presents to ED via EMS from [facility] after being found down, unwitnessed fall. On EMS initial assessment, pt was bradycardic to 38, BP's 200's/100's, EMS rhythm strip compatible with complete heart block, as reviewed here in Ed. Pt sent for CT head and c-spine, then daughter arrives, who states she would not want any intervention, requests cancel of labs or any further imaging. Pt and daughter request repair of laceration (as above), then request discharge home. Declines IV or any further tx. They are aware of the dangers of 3rd degree heart block, including repeated falls or death. Pt discharged back to facility, daughter drives her home. Pt without any obvious extremity injury, appears comfortable.  Pt condition, course, and discharge were discussed with attending physician Dr. Blane Ohara.   Final Clinical Impressions(s) / ED Diagnoses   Final diagnoses:  Fall, initial encounter  Eyebrow laceration, left, initial encounter  Heart block AV complete [facility]    New Prescriptions Discharge Medication List as of 06/20/2016  9:36 PM       Horald Pollen, MD 06/20/16 2348    Blane Ohara,  MD 06/21/16 0004

## 2016-06-20 NOTE — ED Notes (Signed)
Per daughter, pt is a DNR hospice care and is not to have any IV or CT done, Dr. Jodi MourningZavitz talking to daughter that is Pt's PA at the bedside.

## 2016-06-25 ENCOUNTER — Emergency Department (HOSPITAL_COMMUNITY)
Admission: EM | Admit: 2016-06-25 | Discharge: 2016-06-25 | Disposition: A | Discharge status: Home / Self Care | Attending: Emergency Medicine | Admitting: Emergency Medicine

## 2016-06-25 ENCOUNTER — Emergency Department (HOSPITAL_COMMUNITY)

## 2016-06-25 ENCOUNTER — Encounter (HOSPITAL_COMMUNITY): Payer: Self-pay

## 2016-06-25 DIAGNOSIS — G309 Alzheimer's disease, unspecified: Secondary | ICD-10-CM | POA: Insufficient documentation

## 2016-06-25 DIAGNOSIS — Y92002 Bathroom of unspecified non-institutional (private) residence single-family (private) house as the place of occurrence of the external cause: Secondary | ICD-10-CM | POA: Insufficient documentation

## 2016-06-25 DIAGNOSIS — I129 Hypertensive chronic kidney disease with stage 1 through stage 4 chronic kidney disease, or unspecified chronic kidney disease: Secondary | ICD-10-CM | POA: Insufficient documentation

## 2016-06-25 DIAGNOSIS — W1830XA Fall on same level, unspecified, initial encounter: Secondary | ICD-10-CM | POA: Diagnosis not present

## 2016-06-25 DIAGNOSIS — T148 Other injury of unspecified body region: Secondary | ICD-10-CM | POA: Diagnosis not present

## 2016-06-25 DIAGNOSIS — S199XXA Unspecified injury of neck, initial encounter: Secondary | ICD-10-CM | POA: Diagnosis present

## 2016-06-25 DIAGNOSIS — F0281 Dementia in other diseases classified elsewhere with behavioral disturbance: Secondary | ICD-10-CM | POA: Insufficient documentation

## 2016-06-25 DIAGNOSIS — W19XXXA Unspecified fall, initial encounter: Secondary | ICD-10-CM

## 2016-06-25 DIAGNOSIS — S161XXA Strain of muscle, fascia and tendon at neck level, initial encounter: Secondary | ICD-10-CM | POA: Insufficient documentation

## 2016-06-25 DIAGNOSIS — Y999 Unspecified external cause status: Secondary | ICD-10-CM | POA: Insufficient documentation

## 2016-06-25 DIAGNOSIS — J449 Chronic obstructive pulmonary disease, unspecified: Secondary | ICD-10-CM | POA: Insufficient documentation

## 2016-06-25 DIAGNOSIS — N181 Chronic kidney disease, stage 1: Secondary | ICD-10-CM | POA: Insufficient documentation

## 2016-06-25 DIAGNOSIS — Z96641 Presence of right artificial hip joint: Secondary | ICD-10-CM | POA: Insufficient documentation

## 2016-06-25 DIAGNOSIS — S0990XA Unspecified injury of head, initial encounter: Secondary | ICD-10-CM | POA: Diagnosis not present

## 2016-06-25 DIAGNOSIS — M5489 Other dorsalgia: Secondary | ICD-10-CM | POA: Diagnosis not present

## 2016-06-25 DIAGNOSIS — Y939 Activity, unspecified: Secondary | ICD-10-CM | POA: Diagnosis not present

## 2016-06-25 NOTE — ED Triage Notes (Signed)
Pt presents with fall in bathroom at SNF that was unwitnessed.  Staff had seen pt earlier and walked by hearing pt yelling.  Pt denies any LOC.

## 2016-06-25 NOTE — ED Provider Notes (Signed)
[facility]-EMERGENCY DEPT Provider Note   CSN: [id number] Arrival date & time: 06/25/16  4098     History   Chief Complaint Chief Complaint  Patient presents with  . Fall    HPI [name] is a 82 y.o. female.  Patient is a 82 year old female with past medical history of COPD, anemia, hypertension. She presents for evaluation of a fall. She lives in an extended care facility where she apparently had a dispute with a caregiver. Shortly thereafter she was found on the floor in the bathroom yelling for help. She apparently fell. She denies to me that anything specifically is hurting, just that she "hurts all over". She denies any loss of consciousness.  She does have a history of dementia and history is somewhat limited secondary to this.  She was seen here in the emergency department approximately 5 days ago after a fall. She had a laceration above her left eyebrow repaired. She also underwent a CT scan of the head and cervical spine which were unremarkable. She was found at that time to be in a complete heart block, however the daughter who is a nurse refused any intervention or further workup.      Past Medical History:  Diagnosis Date  . Abnormality of gait   . Adult failure to thrive   . Altered mental status   . Anemia, unspecified   . Anxiety state, unspecified   . Atrioventricular block, unspecified   . Atrophy of vulva   . Cellulitis and abscess of unspecified site   . Chronic kidney disease, stage I   . Closed dislocation of shoulder, unspecified site   . COPD (chronic obstructive pulmonary disease) (HCC)   . Dementia in conditions classified elsewhere without behavioral disturbance   . Depressive disorder, not elsewhere classified   . Edema   . Extrinsic asthma, unspecified   . Gout, unspecified   . Headache(784.0)   . Hypertension   . Insomnia, unspecified   . Lumbago   . Oral aphthae   . Orthostatic hypotension   . Osteoarthrosis, unspecified whether  generalized or localized, unspecified site   . Restless legs syndrome (RLS)   . Sebaceous cyst   . Senile osteoporosis   . Spinal stenosis, unspecified region other than cervical   . Tachycardia, unspecified   . Transient disorder of initiating or maintaining sleep   . Unspecified disorder of kidney and ureter   . Unspecified urinary incontinence   . Unspecified vitamin D deficiency   . Urinary frequency     Patient Active Problem List   Diagnosis Date Noted  . Blister of foot with infection 07/30/2015  . Edema 07/30/2015  . Bradycardia 12/17/2014  . Inability to ambulate due to knee 11/03/2014  . Wound, open, leg 08/01/2014  . Severe malnutrition (HCC) 08/14/2013    Class: Chronic  . PNA (pneumonia) 08/10/2013  . Cough 08/09/2013  . Failed total hip arthroplasty with dislocation (HCC) 08/08/2013    Class: Acute  . Other malaise and fatigue 03/25/2013  . Senile osteoporosis   . Vitamin D deficiency   . Closed dislocation of shoulder, unspecified site   . Spinal stenosis, lumbar region, with neurogenic claudication   . Alzheimer's disease 02/06/2010  . Essential hypertension 02/06/2010  . ASTHMA, UNSPECIFIED, UNSPECIFIED STATUS 02/06/2010  . ABNORMAL EKG 02/06/2010    Past Surgical History:  Procedure Laterality Date  . ABDOMINAL HYSTERECTOMY     partial  . BACK SURGERY     fusion  . BASAL  CELL CARCINOMA EXCISION    . BASAL CELL CARCINOMA EXCISION     face  . BREAST SURGERY     bilateral breast reduction  . CESAREAN SECTION    . EYE SURGERY     bilateral cataract  . HIP CLOSED REDUCTION Right 08/07/2013   Procedure: ATTEMPTED CLOSED MANIPULATION HIP;  Surgeon: Jodi Marbleavid A Thompson, MD;  [facility];  Service: Orthopedics;  Laterality: Right;  . HIP SURGERY     (R) hip replacement  . TOTAL HIP REVISION Right 08/08/2013   Procedure: TOTAL HIP REVISION- right;  Surgeon: Velna OchsPeter G Dalldorf, MD;  Location: [facility] OR;  Service: Orthopedics;  Laterality: Right;    OB  History    No data available       Home Medications    Prior to Admission medications   Medication Sig Start Date End Date Taking? Authorizing Provider  acetaminophen (TYLENOL) 325 MG tablet Take 650 mg by mouth every 6 (six) hours as needed for mild pain or moderate pain.     Historical Provider, MD  alum & mag hydroxide-simeth (MYLANTA DOUBLE-STRENGTH) 400-400-40 MG/5ML suspension Take 30 mLs by mouth every 6 (six) hours as needed for indigestion (heartburn and upset stomach). Reported on 01/06/2016    Historical Provider, MD  capsicum oleoresin (TRIXAICIN) 0.025 % cream Apply 1 application topically 2 (two) times daily as needed.    Historical Provider, MD  cholecalciferol (VITAMIN D) 1000 UNITS tablet Take 1,000 Units by mouth daily.    Historical Provider, MD  clonazePAM (KLONOPIN) 1 MG tablet Take 1 tablet (1 mg total) by mouth at bedtime. 04/02/16   Tiffany L Reed, DO  diphenhydrAMINE (BENADRYL READYMIST) spray Apply 2 sprays topically every 8 (eight) hours as needed (for burning). Apply to right foot blistered area for burning    Historical Provider, MD  docusate sodium (COLACE) 100 MG capsule Take 100 mg by mouth 2 (two) times daily. Reported on 01/06/2016    Historical Provider, MD  furosemide (LASIX) 20 MG tablet Take 1 tablet (20 mg total) by mouth every other day. 03/05/15   Sharon SellerJessica K Eubanks, NP  HYDROcodone-acetaminophen (NORCO) 10-325 MG tablet Take 1 tablet by mouth every 4 (four) hours as needed. Reported on 01/06/2016    Historical Provider, MD  HYDROcodone-acetaminophen (NORCO) 10-325 MG tablet Take 1 tablet by mouth 2 (two) times daily. 06/08/16   Tiffany L Reed, DO  lisinopril (PRINIVIL,ZESTRIL) 40 MG tablet Take 40 mg by mouth daily.    Historical Provider, MD  Melatonin 3 MG TABS Take by mouth at bedtime.    Historical Provider, MD  mirtazapine (REMERON) 7.5 MG tablet Take 7.5 mg by mouth at bedtime.    Historical Provider, MD  Polyethyl Glycol-Propyl Glycol (SYSTANE) 0.4-0.3 %  SOLN Instill one drop into each eye two times daily to relieve dry,itchy eye. 02/01/14   Sharon SellerJessica K Eubanks, NP  potassium chloride SA (K-DUR,KLOR-CON) 20 MEQ tablet Take 1 tablet (20 mEq total) by mouth every other day. For chf 12/17/14   Sharon SellerJessica K Eubanks, NP  ranitidine (ZANTAC) 150 MG tablet Take 150 mg by mouth every evening. Reported on 01/06/2016    Historical Provider, MD  senna-docusate (SENOKOT-S) 8.6-50 MG per tablet Take 1 tablet by mouth daily.    Historical Provider, MD    Family History Family History  Problem Relation Age of Onset  . Diabetes Mother   . Diabetes Father   . Stroke Sister   . Heart attack Brother     Social  History Social History  Substance Use Topics  . Smoking status: Never Smoker  . Smokeless tobacco: Never Used  . Alcohol use No     Allergies   Sulfa antibiotics; Clonidine derivatives; Lactose intolerance (gi); and Verapamil   Review of Systems Review of Systems  Unable to perform ROS: Dementia     Physical Exam Updated Vital Signs BP (!) 236/75   Pulse (!) 57   Temp 98.6 F (37 C) (Oral)   Resp 18   Ht 4\' 11"  (1.499 m)   Wt 100 lb (45.4 kg)   SpO2 98%   BMI 20.20 kg/m   Physical Exam  Constitutional: She is oriented to person, place, and time. She appears well-developed and well-nourished. No distress.  HENT:  Head: Normocephalic.  The left eyebrow is noted to have a healing laceration with old contusions extending around the eye and into the cheek.  Eyes: EOM are normal. Pupils are equal, round, and reactive to light.  Neck: Normal range of motion. Neck supple.  Cardiovascular: Normal rate and regular rhythm.  Exam reveals no gallop and no friction rub.   No murmur heard. Pulmonary/Chest: Effort normal and breath sounds normal. No respiratory distress. She has no wheezes.  Abdominal: Soft. Bowel sounds are normal. She exhibits no distension. There is no tenderness.  Musculoskeletal: Normal range of motion.  Both hips have good  range of motion without significant discomfort. There is no obvious deformity or swelling of the extremities. DP pulses are easily palpable bilaterally.  Neurological: She is alert and oriented to person, place, and time. No cranial nerve deficit. She exhibits normal muscle tone. Coordination normal.  Skin: Skin is warm and dry. She is not diaphoretic.  Nursing note and vitals reviewed.    ED Treatments / Results  Labs (all labs ordered are listed, but only abnormal results are displayed) Labs Reviewed - No data to display  EKG  EKG Interpretation None       Radiology No results found.  Procedures Procedures (including critical care time)  Medications Ordered in ED Medications - No data to display   Initial Impression / Assessment and Plan / ED Course  I have reviewed the triage vital signs and the nursing notes.  Pertinent labs & imaging results that were available during my care of the patient were reviewed by me and considered in my medical decision making (see chart for details).  Clinical Course    Imaging studies reveal no evidence for fracture or dislocation or intracranial injury. I spoke with the patient's daughter, who is power of attorney, and informed her of her mother's presence in the ER. She is well where of her mother's complete heart block and desires no intervention for this. She also expressed a desire for minimal intervention or imaging in the future. The daughter will take the patient home to her extended care facility.  Final Clinical Impressions(s) / ED Diagnoses   Final diagnoses:  None    New Prescriptions New Prescriptions   No medications on file     Geoffery Lyons, MD 06/26/16 0725

## 2016-06-29 ENCOUNTER — Ambulatory Visit (INDEPENDENT_AMBULATORY_CARE_PROVIDER_SITE_OTHER): Payer: Medicare Other | Admitting: Internal Medicine

## 2016-06-29 ENCOUNTER — Encounter: Payer: Self-pay | Admitting: Internal Medicine

## 2016-06-29 VITALS — BP 160/70 | HR 52 | Temp 97.9°F | Wt 90.0 lb

## 2016-06-29 DIAGNOSIS — F411 Generalized anxiety disorder: Secondary | ICD-10-CM | POA: Diagnosis not present

## 2016-06-29 DIAGNOSIS — R627 Adult failure to thrive: Secondary | ICD-10-CM | POA: Diagnosis not present

## 2016-06-29 DIAGNOSIS — T148XXA Other injury of unspecified body region, initial encounter: Secondary | ICD-10-CM

## 2016-06-29 DIAGNOSIS — G301 Alzheimer's disease with late onset: Secondary | ICD-10-CM

## 2016-06-29 DIAGNOSIS — F0280 Dementia in other diseases classified elsewhere without behavioral disturbance: Secondary | ICD-10-CM

## 2016-06-29 DIAGNOSIS — R296 Repeated falls: Secondary | ICD-10-CM

## 2016-06-29 MED ORDER — CLONAZEPAM 1 MG PO TABS: ORAL_TABLET | ORAL | 5 refills | Status: DC

## 2016-06-29 NOTE — Patient Instructions (Signed)
Increase klonopin to 0.5mg  in the morning and 1mg  at bedtime. Stop systane eye drops. Label her closet door and dresser so she knows which is hers.

## 2016-06-29 NOTE — Progress Notes (Signed)
Location:  [facility] clinic Provider:  Amarianna Abplanalp L. Renato Gails, D.O., C.M.D.  Code Status: DNR, MOST form Goals of Care:  Advanced Directives 06/25/2016  Does patient have an advance directive? -  Type of Advance Directive Out of facility DNR (pink MOST or yellow form)  Does patient want to make changes to advanced directive? -  Copy of advanced directive(s) in chart? -  Pre-existing out of facility DNR order (yellow form or pink MOST form) -  MOST is in media from 01/05/14--needs to be updated to [facility] if she and family do not want her hospitalized   Chief Complaint  Patient presents with  . Acute Visit    emergency room follow up    HPI: Patient is a 82 y.o. female seen today for an acute visit following two ED visits with falls.  Of note, she is on hospice care.  [facility] sends her out for her falls when she hits her head.  She did require lacerations to the left side of her head above her eye the first of the two falls.    Her memory is declining as of late.  Her daughter asked that we recheck her memory.  She is very anxious today, tremulous and worried.   Apparently, she's recently had a new roommate who had the room to herself prior to that for quite some time.  She does not like to share the space and has been nasty to [place] and saying she is trying to steal her property which has not been the case.    She is not in need of the systane drops any longer. She agrees that her nerves are shot lately and was willing to take more anxiety medication.   Past Medical History:  Diagnosis Date  . Abnormality of gait   . Adult failure to thrive   . Altered mental status   . Anemia, unspecified   . Anxiety state, unspecified   . Atrioventricular block, unspecified   . Atrophy of vulva   . Cellulitis and abscess of unspecified site   . Chronic kidney disease, stage I   . Closed dislocation of shoulder, unspecified site   . COPD (chronic obstructive pulmonary disease) (HCC)   . Dementia in  conditions classified elsewhere without behavioral disturbance   . Depressive disorder, not elsewhere classified   . Edema   . Extrinsic asthma, unspecified   . Gout, unspecified   . Headache(784.0)   . Hypertension   . Insomnia, unspecified   . Lumbago   . Oral aphthae   . Orthostatic hypotension   . Osteoarthrosis, unspecified whether generalized or localized, unspecified site   . Restless legs syndrome (RLS)   . Sebaceous cyst   . Senile osteoporosis   . Spinal stenosis, unspecified region other than cervical   . Tachycardia, unspecified   . Transient disorder of initiating or maintaining sleep   . Unspecified disorder of kidney and ureter   . Unspecified urinary incontinence   . Unspecified vitamin D deficiency   . Urinary frequency     Past Surgical History:  Procedure Laterality Date  . ABDOMINAL HYSTERECTOMY     partial  . BACK SURGERY     fusion  . BASAL CELL CARCINOMA EXCISION    . BASAL CELL CARCINOMA EXCISION     face  . BREAST SURGERY     bilateral breast reduction  . CESAREAN SECTION    . EYE SURGERY     bilateral cataract  . HIP CLOSED REDUCTION  Right 08/07/2013   Procedure: ATTEMPTED CLOSED MANIPULATION HIP;  Surgeon: Jodi Marble, MD;  Location: [facility] OR;  Service: Orthopedics;  Laterality: Right;  . HIP SURGERY     (R) hip replacement  . TOTAL HIP REVISION Right 08/08/2013   Procedure: TOTAL HIP REVISION- right;  Surgeon: Velna Ochs, MD;  Location: [facility] OR;  Service: Orthopedics;  Laterality: Right;    Allergies  Allergen Reactions  . Sulfa Antibiotics Shortness Of Breath    Worsens asthma  . Clonidine Derivatives     Unknown reaction Per MAR   . Lactose Intolerance (Gi)     Unknown reaction Per MAR   . Verapamil     Unknown reaction Per [facility]      Medication List       Accurate as of 06/29/16  3:36 PM. Always use your most recent med list.          acetaminophen 325 MG tablet Commonly known as:  TYLENOL Take 650 mg by mouth  every 6 (six) hours as needed for mild pain or moderate pain.   cholecalciferol 1000 units tablet Commonly known as:  VITAMIN D Take 1,000 Units by mouth daily.   clonazePAM 1 MG tablet Commonly known as:  KLONOPIN Take 1 tablet (1 mg total) by mouth at bedtime.   docusate sodium 100 MG capsule Commonly known as:  COLACE Take 100 mg by mouth 2 (two) times daily. Reported on 01/06/2016   furosemide 20 MG tablet Commonly known as:  LASIX Take 1 tablet (20 mg total) by mouth every other day.   HYDROcodone-acetaminophen 10-325 MG tablet Commonly known as:  NORCO Take 1 tablet by mouth 2 (two) times daily.   lisinopril 40 MG tablet Commonly known as:  PRINIVIL,ZESTRIL Take 40 mg by mouth daily.   Melatonin 3 MG Tabs Take by mouth at bedtime.   mirtazapine 7.5 MG tablet Commonly known as:  REMERON Take 7.5 mg by mouth at bedtime.   MYLANTA DOUBLE-STRENGTH 400-400-40 MG/5ML suspension Generic drug:  alum & mag hydroxide-simeth Take 30 mLs by mouth every 6 (six) hours as needed for indigestion (heartburn and upset stomach). Reported on 01/06/2016   potassium chloride SA 20 MEQ tablet Commonly known as:  K-DUR,KLOR-CON Take 1 tablet (20 mEq total) by mouth every other day. For chf   ranitidine 150 MG tablet Commonly known as:  ZANTAC Take 150 mg by mouth every evening. Reported on 01/06/2016   senna-docusate 8.6-50 MG tablet Commonly known as:  Senokot-S Take 1 tablet by mouth daily.       Review of Systems:  Review of Systems  Constitutional: Positive for malaise/fatigue and weight loss. Negative for chills and fever.  HENT: Positive for hearing loss. Negative for congestion.   Eyes: Positive for blurred vision.  Respiratory: Negative for cough, sputum production and shortness of breath.   Cardiovascular: Negative for chest pain, palpitations and leg swelling.  Gastrointestinal: Negative for abdominal pain, blood in stool, constipation, heartburn and melena.        Constipation controlled  Genitourinary: Negative for dysuria.       Urinary incontinence  Musculoskeletal: Positive for back pain, falls, joint pain and myalgias.  Skin: Negative for itching and rash.       bruising on her left side of her face from falls  Neurological: Positive for weakness.    Health Maintenance  Topic Date Due  . ZOSTAVAX  2019-08-1378  . INFLUENZA VACCINE  04/28/2016  . PNA vac Low Risk Adult (  2 of 2 - PPSV23) 05/22/2016  . DEXA SCAN  09/28/2048 (Originally June 04, 201984)  . TETANUS/TDAP  09/19/2025    Physical Exam: Vitals:   06/29/16 1520  BP: (!) 160/70  Pulse: (!) 52  Temp: 97.9 F (36.6 C)  TempSrc: Oral  SpO2: 94%  Weight: 90 lb (40.8 kg)   Body mass index is 18.18 kg/m. Physical Exam  Constitutional: No distress.  HENT:  Head: Normocephalic and atraumatic.  Eyes: EOM are normal. Pupils are equal, round, and reactive to light.  Neck: Neck supple.  Cardiovascular: Normal rate, regular rhythm and intact distal pulses.   Murmur heard. Pulmonary/Chest: Effort normal and breath sounds normal. No respiratory distress. She has no wheezes. She has no rales.  Abdominal: Soft. Bowel sounds are normal.  Musculoskeletal: Normal range of motion. She exhibits tenderness and deformity.  Comes in wheelchair tenderness of finger joints, lower back, kyphoscoliosis present  Neurological: She is alert. No cranial nerve deficit.  Skin: Skin is warm and dry.  Laceration with absorbable sutures intact on left eyebrow; ecchymoses of left face/temple area    Labs reviewed: Basic Metabolic Panel:  Recent Labs  16/06/9611/23/16 1032  NA 144  K 4.3  CL 109  CO2 27  GLUCOSE 95  BUN 33*  CREATININE 1.03*  CALCIUM 9.7   Liver Function Tests:  Recent Labs  09/20/15 1032  AST 22  ALT 18  ALKPHOS 70  BILITOT 0.7  PROT 6.9  ALBUMIN 4.1   No results for input(s): LIPASE, AMYLASE in the last 8760 hours. No results for input(s): AMMONIA in the last 8760  hours. CBC:  Recent Labs  09/20/15 1032  WBC 6.3  NEUTROABS 4.2  HGB 12.2  HCT 38.3  MCV 87.4  PLT 198   Lipid Panel: No results for input(s): CHOL, HDL, LDLCALC, TRIG, CHOLHDL, LDLDIRECT in the last 8760 hours. No results found for: HGBA1C  Procedures since last visit: Dg Chest 1 View  Result Date: 06/25/2016 CLINICAL DATA:  Fall this morning, right posterior hip pain EXAM: CHEST 1 VIEW COMPARISON:  08/02/2013 FINDINGS: Cardiomegaly again noted. There is small left pleural effusion with left basilar atelectasis or infiltrate. Atherosclerotic calcifications of thoracic aorta. Degenerative changes thoracic spine. Extensive degenerative changes right shoulder. IMPRESSION: Small left pleural effusion with left basilar atelectasis or infiltrate. No pulmonary edema. Degenerative changes thoracic spine and right shoulder. Electronically Signed   By: Natasha MeadLiviu  Pop M.D.   On: 06/25/2016 10:18   Dg Pelvis 1-2 Views  Result Date: 06/25/2016 CLINICAL DATA:  Fall. EXAM: PELVIS - 1-2 VIEW COMPARISON:  09/16/2012 FINDINGS: Right total hip arthroplasty intact and unchanged. There is diffuse decreased bone mineralization. Mild to moderate degenerative change of the left hip unchanged. No acute fracture or dislocation. Stable postsurgical changes of the lumbosacral spine. Atherosclerotic plaque over the femoral arteries. IMPRESSION: No acute findings. Degenerative change of the left hip. Stable right total hip arthroplasty. Electronically Signed   By: Elberta Fortisaniel  Boyle M.D.   On: 06/25/2016 10:19   Ct Head Wo Contrast  Result Date: 06/25/2016 CLINICAL DATA:  Pt fell, pt has bruising on her left eye, pt also has cut above left eye, pt unable to answer questions EXAM: CT HEAD WITHOUT CONTRAST CT CERVICAL SPINE WITHOUT CONTRAST TECHNIQUE: Multidetector CT imaging of the head and cervical spine was performed following the standard protocol without intravenous contrast. Multiplanar CT image reconstructions of the  cervical spine were also generated. COMPARISON:  06/20/2016 FINDINGS: CT HEAD FINDINGS Brain: There is central and  cortical atrophy. Periventricular white matter changes are consistent with small vessel disease. There is no intra or extra-axial fluid collection or mass lesion. The basilar cisterns and ventricles have a normal appearance. There is no CT evidence for acute infarction or hemorrhage. Vascular: Atherosclerotic calcification of the internal carotid arteries and vertebral arteries. Skull: No calvarial fracture. Sinuses/Orbits: Orbits are intact. There is soft tissue swelling superior to the left orbit. Paranasal sinuses are normally aerated. There is mild mucosal thickening of the ethmoid air cells. Mastoid air cells are normally aerated. Other: Focal areas of scalp edema or thickening in the left parietal and occipital regions. No underlying calvarial fracture. CT CERVICAL SPINE FINDINGS Alignment: There is significant degenerative change involving C5-6 and C6-7. Mild retrolisthesis of C5 on C6 measures 2 mm and is likely degenerative. There is no acute fracture or traumatic subluxation. There is significant atherosclerotic calcification of the aortic arch and its branches. Nodular appearance of the right thyroid lobe is consistent with goiter. Skull base and vertebrae: No acute fracture. No primary bone lesion or focal pathologic process. Soft tissues and spinal canal: No prevertebral fluid or swelling. No visible canal hematoma. Disc levels:  Disc height loss at C5-6 and C6-7. Upper chest: The lung apices clear. Other: None IMPRESSION: 1.  No evidence for acute intracranial abnormality. 2. Atrophy and small vessel disease. 3. Focal soft tissue swelling superior to the left orbit not associated with orbital fracture. 4. Mild mucosal thickening of the ethmoid air cells. 5. Focal areas of scalp edema without calvarial fracture. 6. No acute cervical spine injury.  The 7. Mid cervical degenerative changes.  8. Thyroid goiter. Electronically Signed   By: Norva Pavlov M.D.   On: 06/25/2016 10:39   Ct Head Wo Contrast  Result Date: 06/20/2016 CLINICAL DATA:  Pain following fall EXAM: CT HEAD WITHOUT CONTRAST CT CERVICAL SPINE WITHOUT CONTRAST TECHNIQUE: Multidetector CT imaging of the head and cervical spine was performed following the standard protocol without intravenous contrast. Multiplanar CT image reconstructions of the cervical spine were also generated. COMPARISON:  Head CT November 02, 2013 FINDINGS: CT HEAD FINDINGS Brain: There is moderate diffuse atrophy. There is no intracranial mass, hemorrhage, extra-axial fluid collection, or midline shift. There is patchy small vessel disease in the centra semiovale bilaterally, stable. There is a prior small lacunar infarct in the left cerebellum, stable. There is a prior small lacunar infarct in the anterior left lentiform nucleus, stable. There is no new gray-white compartment lesion. No acute infarct evident. Vascular: There is stable mild increased attenuation throughout the left middle cerebral artery, likely due to vascular calcification. There is no new hyperdense vessel. There is calcification in both carotid siphon regions. There is calcification in the distal vertebral arteries bilaterally. Skull: There is a left frontal scalp hematoma which extends to the level of the superior left orbit in a preseptal location. The bony calvarium appears intact. Sinuses/Orbits: There is mild mucosal thickening in several ethmoid air cells bilaterally. Other paranasal sinuses are clear. No intraorbital lesions are evident. Other: The mastoid air cells are clear. CT CERVICAL SPINE FINDINGS Alignment: There is minimal anterolisthesis of C4 on C5. There is minimal retrolisthesis of C5 on C6. There is minimal anterolisthesis of T1 on T2. These changes are felt to be due to underlying spondylosis. Skull base and vertebrae: The craniocervical junction region appears  unremarkable. There is mild pannus surrounding the odontoid. No fracture is evident. There are no blastic or lytic bone lesions. Soft tissues and spinal canal:  Prevertebral soft tissues and predental space regions are normal. No spinal stenosis evident. Disc levels: There is moderately severe disc space narrowing at C5-6 and C6-7. There are anterior osteophytes at C4, C5, C6, and C7. There is facet hypertrophy at most levels bilaterally. There is no disc extrusion. There is exit foraminal narrowing at multiple levels due to bony hypertrophy, most notably at C3-4 on the right. Upper chest: There is mild scarring in the lung apices. Other: There is diffuse enlargement of the right lobe of the thyroid with multiple nodular opacities throughout this area. This appearance is consistent with multinodular goiter. There are foci of atherosclerotic calcification in proximal subclavian arteries bilaterally. There is also carotid artery calcification bilaterally. IMPRESSION: CT head: Atrophy with periventricular small vessel disease. Prior small lacunar infarcts in the left cerebellum and anterior left lentiform nucleus. No intracranial mass, hemorrhage, or extra-axial fluid collection. No acute infarct. There is a left frontal scalp hematoma. No fracture. Mild ethmoid air cell mucosal thickening bilaterally. There are areas of arterial vascular calcification noted. CT cervical spine: No evident fracture. Mild spondylolisthesis at C4-5 and C5-6 is felt to be due to underlying spondylosis. There is multilevel osteoarthritic change. There are multiple foci of vascular calcification including calcification in both carotid arteries. There is enlargement of the right lobe of the thyroid with multinodular goiter in this area. The left lobe of thyroid appears somewhat diminutive. Electronically Signed   By: Bretta Bang III M.D.   On: 06/20/2016 21:01   Ct Cervical Spine Wo Contrast  Result Date: 06/25/2016 CLINICAL DATA:   Pt fell, pt has bruising on her left eye, pt also has cut above left eye, pt unable to answer questions EXAM: CT HEAD WITHOUT CONTRAST CT CERVICAL SPINE WITHOUT CONTRAST TECHNIQUE: Multidetector CT imaging of the head and cervical spine was performed following the standard protocol without intravenous contrast. Multiplanar CT image reconstructions of the cervical spine were also generated. COMPARISON:  06/20/2016 FINDINGS: CT HEAD FINDINGS Brain: There is central and cortical atrophy. Periventricular white matter changes are consistent with small vessel disease. There is no intra or extra-axial fluid collection or mass lesion. The basilar cisterns and ventricles have a normal appearance. There is no CT evidence for acute infarction or hemorrhage. Vascular: Atherosclerotic calcification of the internal carotid arteries and vertebral arteries. Skull: No calvarial fracture. Sinuses/Orbits: Orbits are intact. There is soft tissue swelling superior to the left orbit. Paranasal sinuses are normally aerated. There is mild mucosal thickening of the ethmoid air cells. Mastoid air cells are normally aerated. Other: Focal areas of scalp edema or thickening in the left parietal and occipital regions. No underlying calvarial fracture. CT CERVICAL SPINE FINDINGS Alignment: There is significant degenerative change involving C5-6 and C6-7. Mild retrolisthesis of C5 on C6 measures 2 mm and is likely degenerative. There is no acute fracture or traumatic subluxation. There is significant atherosclerotic calcification of the aortic arch and its branches. Nodular appearance of the right thyroid lobe is consistent with goiter. Skull base and vertebrae: No acute fracture. No primary bone lesion or focal pathologic process. Soft tissues and spinal canal: No prevertebral fluid or swelling. No visible canal hematoma. Disc levels:  Disc height loss at C5-6 and C6-7. Upper chest: The lung apices clear. Other: None IMPRESSION: 1.  No evidence  for acute intracranial abnormality. 2. Atrophy and small vessel disease. 3. Focal soft tissue swelling superior to the left orbit not associated with orbital fracture. 4. Mild mucosal thickening of the ethmoid air cells.  5. Focal areas of scalp edema without calvarial fracture. 6. No acute cervical spine injury.  The 7. Mid cervical degenerative changes. 8. Thyroid goiter. Electronically Signed   By: Norva Pavlov M.D.   On: 06/25/2016 10:39   Ct Cervical Spine Wo Contrast  Result Date: 06/20/2016 CLINICAL DATA:  Pain following fall EXAM: CT HEAD WITHOUT CONTRAST CT CERVICAL SPINE WITHOUT CONTRAST TECHNIQUE: Multidetector CT imaging of the head and cervical spine was performed following the standard protocol without intravenous contrast. Multiplanar CT image reconstructions of the cervical spine were also generated. COMPARISON:  Head CT November 02, 2013 FINDINGS: CT HEAD FINDINGS Brain: There is moderate diffuse atrophy. There is no intracranial mass, hemorrhage, extra-axial fluid collection, or midline shift. There is patchy small vessel disease in the centra semiovale bilaterally, stable. There is a prior small lacunar infarct in the left cerebellum, stable. There is a prior small lacunar infarct in the anterior left lentiform nucleus, stable. There is no new gray-white compartment lesion. No acute infarct evident. Vascular: There is stable mild increased attenuation throughout the left middle cerebral artery, likely due to vascular calcification. There is no new hyperdense vessel. There is calcification in both carotid siphon regions. There is calcification in the distal vertebral arteries bilaterally. Skull: There is a left frontal scalp hematoma which extends to the level of the superior left orbit in a preseptal location. The bony calvarium appears intact. Sinuses/Orbits: There is mild mucosal thickening in several ethmoid air cells bilaterally. Other paranasal sinuses are clear. No intraorbital lesions  are evident. Other: The mastoid air cells are clear. CT CERVICAL SPINE FINDINGS Alignment: There is minimal anterolisthesis of C4 on C5. There is minimal retrolisthesis of C5 on C6. There is minimal anterolisthesis of T1 on T2. These changes are felt to be due to underlying spondylosis. Skull base and vertebrae: The craniocervical junction region appears unremarkable. There is mild pannus surrounding the odontoid. No fracture is evident. There are no blastic or lytic bone lesions. Soft tissues and spinal canal: Prevertebral soft tissues and predental space regions are normal. No spinal stenosis evident. Disc levels: There is moderately severe disc space narrowing at C5-6 and C6-7. There are anterior osteophytes at C4, C5, C6, and C7. There is facet hypertrophy at most levels bilaterally. There is no disc extrusion. There is exit foraminal narrowing at multiple levels due to bony hypertrophy, most notably at C3-4 on the right. Upper chest: There is mild scarring in the lung apices. Other: There is diffuse enlargement of the right lobe of the thyroid with multiple nodular opacities throughout this area. This appearance is consistent with multinodular goiter. There are foci of atherosclerotic calcification in proximal subclavian arteries bilaterally. There is also carotid artery calcification bilaterally. IMPRESSION: CT head: Atrophy with periventricular small vessel disease. Prior small lacunar infarcts in the left cerebellum and anterior left lentiform nucleus. No intracranial mass, hemorrhage, or extra-axial fluid collection. No acute infarct. There is a left frontal scalp hematoma. No fracture. Mild ethmoid air cell mucosal thickening bilaterally. There are areas of arterial vascular calcification noted. CT cervical spine: No evident fracture. Mild spondylolisthesis at C4-5 and C5-6 is felt to be due to underlying spondylosis. There is multilevel osteoarthritic change. There are multiple foci of vascular  calcification including calcification in both carotid arteries. There is enlargement of the right lobe of the thyroid with multinodular goiter in this area. The left lobe of thyroid appears somewhat diminutive. Electronically Signed   By: Bretta Bang III M.D.   On:  06/20/2016 21:01    Assessment/Plan 1. Generalized anxiety disorder -seems this is worse recently due to problems with her roommate -increase klonopin -nerves are affecting her cognition  2. Late onset Alzheimer's disease without behavioral disturbance -MMSE has worsened with score of 17/30 down from her previous with I believe was near perfect (unfortunately this was before the scores were entered into epic) -made worse also by her anxiety  3. Failure to thrive in adult -ongoing, now only 90 lbs -remains on hospice care--I questioned if she truly needed to go out to the hospital--could a hospice nurse do suturing to avoid this ED visit? Assuming they were notified  4. Falls frequently -ongoing, just had two recent falls related to new environment and anxiety about roommate the way it sounds  5. Multiple skin tears -s/p sutures on left eyebrow, recovering slowly  Labs/tests ordered:  No orders of the defined types were placed in this encounter.  Next appt:  PRN   Quoc Tome L. Takashi Korol, D.O. Geriatrics [facility] [address], [place] [address] Cell Phone (Mon-Fri 8am-5pm):  [phone] On Call:  [phone] & follow prompts after 5pm & weekends Office Phone:  [phone] Office Fax:  [phone]

## 2016-07-02 ENCOUNTER — Telehealth: Payer: Self-pay

## 2016-07-02 ENCOUNTER — Other Ambulatory Visit: Payer: Self-pay

## 2016-07-02 NOTE — Telephone Encounter (Signed)
FYI to Dr.Reed: Message left on triage voicemail: patient continues to decline, weakness, requires feeding, dressing, and unable to ambulate. Eating 25 %  Patients daughter [name](Robin) is requesting that all medications be stopped.  Hospice: Stopped all medications except: Lasix, potassium and Lisinopril. Laxative changed to ducosate supp and patient will have a tylenol supp as needed for fever.   Patient will be on Roxnanol 5 mg BID, and every 4 hours as needed. Ativan 0.5 mg 1 by mouth BID and every 4 hours as needed for agitation and anxiety

## 2016-07-02 NOTE — Telephone Encounter (Signed)
Message left on triage voicemail requesting hard script for increased dosage for Klonopin as instructed at last OV .  I reviewed last OV and RX was printed and given to the patient/family.  [facility] to [facility] to inform [facility] was placed on hold several times. I ended call and will try again later.

## 2016-07-02 NOTE — Telephone Encounter (Signed)
So am I understanding that Zella BallRobin also wants the lisinopril, lasix and potassium stopped?  If so, that's ok with me.  I just want [place] Sink[place]ita to be comfortable and at peace.

## 2016-07-03 MED ORDER — UNABLE TO FIND: 0 refills | Status: AC

## 2016-07-03 NOTE — Telephone Encounter (Signed)
I informed [name](Hospice Nurse) of Dr.Reed's response. Order to d/c medication need to be faxed to [phone]

## 2016-07-10 NOTE — Telephone Encounter (Signed)
I called [facility] to confirm that they hardscript given to patient at OV on 06-29-16. Receptionist states they have what they need.

## 2016-07-20 ENCOUNTER — Encounter (HOSPITAL_COMMUNITY): Payer: Self-pay | Admitting: Dentistry

## 2016-09-24 ENCOUNTER — Encounter: Payer: Self-pay | Admitting: Internal Medicine

## 2016-10-29 DEATH — deceased
